# Patient Record
Sex: Female | Born: 1935
Health system: Southern US, Community
[De-identification: ages and names within clinical notes are randomized; demographics above are authoritative.]

## PROBLEM LIST (undated history)

## (undated) DIAGNOSIS — Z66 Do not resuscitate: Secondary | ICD-10-CM

## (undated) DIAGNOSIS — Z9884 Bariatric surgery status: Secondary | ICD-10-CM

## (undated) DIAGNOSIS — C4490 Unspecified malignant neoplasm of skin, unspecified: Secondary | ICD-10-CM

## (undated) DIAGNOSIS — E7800 Pure hypercholesterolemia, unspecified: Secondary | ICD-10-CM

## (undated) DIAGNOSIS — D472 Monoclonal gammopathy: Secondary | ICD-10-CM

## (undated) DIAGNOSIS — B029 Zoster without complications: Secondary | ICD-10-CM

## (undated) DIAGNOSIS — E119 Type 2 diabetes mellitus without complications: Secondary | ICD-10-CM

## (undated) DIAGNOSIS — J8410 Pulmonary fibrosis, unspecified: Secondary | ICD-10-CM

## (undated) DIAGNOSIS — M069 Rheumatoid arthritis, unspecified: Secondary | ICD-10-CM

## (undated) HISTORY — DX: Monoclonal gammopathy: D47.2

## (undated) HISTORY — DX: Bariatric surgery status: Z98.84

## (undated) HISTORY — DX: Unspecified malignant neoplasm of skin, unspecified: C44.90

## (undated) HISTORY — PX: EXPLORATION POST OPERATIVE OPEN HEART: SHX5061

## (undated) HISTORY — DX: Do not resuscitate: Z66

## (undated) HISTORY — PX: MOHS SURGERY: SHX181

## (undated) HISTORY — DX: Zoster without complications: B02.9

## (undated) HISTORY — PX: CHOLECYSTECTOMY: SHX55

## (undated) HISTORY — PX: CARDIAC SURGERY: SHX584

---

## 2005-08-27 HISTORY — PX: GASTRIC BYPASS: SHX52

## 2015-06-11 ENCOUNTER — Ambulatory Visit
Admission: EM | Admit: 2015-06-11 | Discharge: 2015-06-11 | Disposition: A | Discharge status: Home / Self Care | Payer: Commercial Managed Care - HMO | Attending: Family Medicine | Admitting: Family Medicine

## 2015-06-11 ENCOUNTER — Encounter: Payer: Self-pay | Admitting: Gynecology

## 2015-06-11 ENCOUNTER — Ambulatory Visit: Payer: Commercial Managed Care - HMO

## 2015-06-11 DIAGNOSIS — J8410 Pulmonary fibrosis, unspecified: Secondary | ICD-10-CM | POA: Diagnosis not present

## 2015-06-11 DIAGNOSIS — N390 Urinary tract infection, site not specified: Secondary | ICD-10-CM | POA: Diagnosis not present

## 2015-06-11 DIAGNOSIS — R911 Solitary pulmonary nodule: Secondary | ICD-10-CM | POA: Diagnosis not present

## 2015-06-11 HISTORY — DX: Pulmonary fibrosis, unspecified: J84.10

## 2015-06-11 HISTORY — DX: Pure hypercholesterolemia, unspecified: E78.00

## 2015-06-11 HISTORY — DX: Type 2 diabetes mellitus without complications: E11.9

## 2015-06-11 HISTORY — DX: Rheumatoid arthritis, unspecified: M06.9

## 2015-06-11 LAB — URINALYSIS COMPLETE WITH MICROSCOPIC (ARMC ONLY)
BILIRUBIN URINE: NEGATIVE
GLUCOSE, UA: NEGATIVE mg/dL
KETONES UR: NEGATIVE mg/dL
Nitrite: POSITIVE — AB
PH: 6 (ref 5.0–8.0)
Protein, ur: 100 mg/dL — AB
SQUAMOUS EPITHELIAL / LPF: NONE SEEN — AB
Specific Gravity, Urine: 1.025 (ref 1.005–1.030)

## 2015-06-11 MED ORDER — PHENAZOPYRIDINE HCL 200 MG PO TABS: 200.0000 mg | ORAL_TABLET | Freq: Three times a day (TID) | ORAL | Status: DC

## 2015-06-11 MED ORDER — SULFAMETHOXAZOLE-TRIMETHOPRIM 800-160 MG PO TABS: 1.0000 | ORAL_TABLET | Freq: Two times a day (BID) | ORAL | Status: DC

## 2015-06-11 NOTE — Discharge Instructions (Signed)
Pulmonary Fibrosis °Pulmonary fibrosis is a type of lung disease that causes scarring. Over time, the scar tissue (fibrosis) builds up in the air sacs of your lungs. This makes it hard for you to breathe. Less oxygen can get into your blood. °Scarring from pulmonary fibrosis gets worse over time. This damage is permanent. Having damaged lungs may make it more likely that you will have heart problems as well. °CAUSES °Usually, the cause of pulmonary fibrosis is not known (idiopathic pulmonary fibrosis). However, pulmonary fibrosis can be caused by:  °· Exposure to occupational and environmental toxins. These include asbestos, silica, and metal dusts. °· Inhaling moldy hay. This can cause an allergic reaction in the lung (farmer's lung) that can lead to pulmonary fibrosis. °· Inhaling toxic fumes. °· Certain medicines. These include drugs used in radiation therapy or used to treat seizures, heart problems, and some infections. °· Autoimmune diseases, such as rheumatoid arthritis, systemic sclerosis, and connective tissue diseases. °· Sarcoidosis. In this disease, areas of inflammatory cells (granulomas) form and most often affect the lungs. °· Genes. Some cases of pulmonary fibrosis may be passed down through families. °RISK FACTORS °You may be at a higher risk for developing pulmonary fibrosis if: °· You have a family history of the disease. °· You have an autoimmune disease or another condition linked to pulmonary fibrosis. °· You are exposed to certain substances or fumes found in agricultural, farm, construction, or factory work. °· You take certain medicines. °SIGNS AND SYMPTOMS °Symptoms may include: °· Difficulty breathing that gets worse with activity. °· Dry, hacking cough. °· Rapid, shallow breathing during exercise or while at rest. °· Shortness of breath that gets worse (dyspnea). °· Bluish skin and lips. °· Loss of appetite. °· Loss of strength. °· Weight loss and fatigue. °· Rounded and enlarged  fingertips (clubbing). °DIAGNOSIS °Your health care provider may suspect pulmonary fibrosis based on your symptoms and medical history. Diagnosis may include a physical exam. Your health care provider will check for signs that strongly suggest that you have pulmonary fibrosis, such as: °· Blue skin around your fingernails or mouth from reduced oxygen. °· Clubbing around the ends of your fingers. °· A crackling sound when you breathe. °Your health care provider may also do tests to confirm the diagnosis. These may include: °· Looking inside your lungs with an instrument (bronchoscopy). °· Imaging studies of your lungs and heart using: °¨ X-rays. °¨ CT scan. °¨ Sound waves (echocardiogram). °· Tests to measure how well you are breathing (pulmonary function tests). °· Exercise testing to see how well your lungs work while you are walking. °· Blood tests. °· A procedure to remove a small piece of lung tissue to examine in a lab (biopsy). °TREATMENT °There is no cure for pulmonary fibrosis. Treatments focus on managing symptoms and preventing scarring from getting worse. This can include: °· Medicines. °¨ You may take steroids to prevent permanent lung changes. Your health care provider may put you on a high dose at first, then on lower dosages for the long term. °¨ Medicines to suppress your body's defense (immune) system. These can have serious side effects. °· You may be monitored with X-rays and laboratory work. °· Oxygen therapy may be helpful if oxygen in your blood is low. °· Surgery. In some cases, a lung transplant is an option. °HOME CARE INSTRUCTIONS °· Take medicines only as directed by your health care provider. °· Keep your vaccinations up to date as recommended by your health care provider.  °·   Do not use any tobacco products, including cigarettes, chewing tobacco, or electronic cigarettes. If you need help quitting, ask your health care provider. °· Get regular exercise, but do not overexert yourself. Ask  your health care provider to suggest some activities that are safe for you to do. Walking and chair exercises can help if you have physical limitations. °· Consider joining a pulmonary rehabilitation program or a support group for people with pulmonary fibrosis. °· Eat small meals often so you do not get too full. Overeating can make breathing trouble worse. °· Maintain a healthy weight. Lose weight if you need to. °· Do breathing exercises as directed by your health care provider. °· Keep all follow-up visits as directed by your health care provider. This is important. °SEEK MEDICAL CARE IF: °· You are not able to be as active as usual. °· You have a long-lasting (chronic) cough. °· You are often short of breath. °· You have a fever or chills. °SEEK IMMEDIATE MEDICAL CARE IF: °· You have chest pain. °· Your breathing is much worse. °· You cannot take a deep breath. °· You have blue skin around your mouth or fingers. °· You have clubbing of your fingers. °· You cough up mucus that is dark in color. °· You have a lot of headaches. °· You get very confused or sleepy. °  °This information is not intended to replace advice given to you by your health care provider. Make sure you discuss any questions you have with your health care provider. °  °Document Released: 11/03/2003 Document Revised: 09/03/2014 Document Reviewed: 01/21/2014 °Elsevier Interactive Patient Education ©2016 [employer] ° °

## 2015-06-11 NOTE — ED Notes (Signed)
Patient c/o x yesterday burning sensation with urination / frequency  And the urge to void.

## 2015-06-11 NOTE — ED Provider Notes (Signed)
CSN: [id number]     Arrival date & time 06/11/15  1149 History   First MD Initiated Contact with Patient 06/11/15 1252     Chief Complaint  Patient presents with  . Urinary Tract Infection   (Consider location/radiation/quality/duration/timing/severity/associated sxs/prior Treatment) HPI   This 80 year old female presents with one-day history of burning with urination along with frequency in satiety. She has not had any fever or chills. Most of her discomfort with urination is after the end of micturition she has no vaginal bleeding or discharge. She's had UTIs in the past last one in the springtime. She recently moved here from [facility] and has not established with a PCP. She has a history of pulmonary fibrosis states that she has been coughing more recently than before but has been nonproductive.  Past Medical History  Diagnosis Date  . Diabetes mellitus without complication ([place])   . High cholesterol   . RA (rheumatoid arthritis) ([place])   . Pulmonary fibrosis [facility])    Past Surgical History  Procedure Laterality Date  . Gastric bypass  2007  . Cardiac surgery    . Exploration post operative open heart    . Cholecystectomy     No family history on file. Social History  Substance Use Topics  . Smoking status: Never Smoker   . Smokeless tobacco: None  . Alcohol Use: No   OB History    No data available     Review of Systems  Constitutional: Negative for fever, chills and fatigue.  Respiratory: Positive for cough and choking.   All other systems reviewed and are negative.   Allergies  Review of patient's allergies indicates no known allergies.  Home Medications   Prior to Admission medications   Medication Sig Start Date End Date Taking? Authorizing Provider  alendronate (FOSAMAX) 70 MG tablet Take 70 mg by mouth once a week. Take with a full glass of water on an empty stomach.   Yes Historical Provider, MD  glipiZIDE (GLUCOTROL) 5 MG tablet Take by  mouth daily before breakfast.   Yes Historical Provider, MD  Multiple Minerals-Vitamins (CALCIUM & VIT D3 BONE HEALTH PO) Take by mouth.   Yes Historical Provider, MD  Nintedanib (OFEV) 150 MG CAPS Take 150 mg by mouth 2 (two) times daily.   Yes Historical Provider, MD  simvastatin (ZOCOR) 10 MG tablet Take 10 mg by mouth daily.   Yes Historical Provider, MD  phenazopyridine (PYRIDIUM) 200 MG tablet Take 1 tablet (200 mg total) by mouth 3 (three) times daily. 06/11/15   Lorin Picket, PA-C  sulfamethoxazole-trimethoprim (BACTRIM DS,SEPTRA DS) 800-160 MG tablet Take 1 tablet by mouth 2 (two) times daily. 06/11/15   Lorin Picket, PA-C   Meds Ordered and Administered this Visit  Medications - No data to display  BP 120/62 mmHg  Pulse 88  Temp(Src) 97.5 F (36.4 C) (Oral)  Resp 12  Ht 5\' 1"  (1.549 m)  Wt 94 lb 9.6 oz (42.91 kg)  BMI 17.88 kg/m2  SpO2 99% No data found.   Physical Exam  Constitutional: She is oriented to person, place, and time. She appears well-developed and well-nourished.  HENT:  Head: Normocephalic and atraumatic.  Eyes: Pupils are equal, round, and reactive to light.  Neck: Neck supple.  Pulmonary/Chest: Effort normal. No respiratory distress. She has no rales.  Lungs sounds show good air exchange. O2 sat is 99%. He has crackles throughout her entire lung fields primarily bases sound louder. Respiratory rate is 12.  Abdominal:  No CVA tenderness is present  Musculoskeletal: Normal range of motion.  Lymphadenopathy:    She has no cervical adenopathy.  Neurological: She is alert and oriented to person, place, and time.  Skin: Skin is warm and dry.  Psychiatric: She has a normal mood and affect. Her behavior is normal. Judgment and thought content normal.  Nursing note and vitals reviewed.   ED Course  Procedures (including critical care time)  Labs Review Labs Reviewed  URINALYSIS COMPLETEWITH MICROSCOPIC ([place] ONLY) - Abnormal; Notable for the  following:    APPearance TURBID (*)    Hgb urine dipstick 2+ (*)    Protein, ur 100 (*)    Nitrite POSITIVE (*)    Leukocytes, UA 3+ (*)    Bacteria, UA MANY (*)    Squamous Epithelial / LPF NONE SEEN (*)    All other components within normal limits  URINE CULTURE    Imaging Review Dg Chest 2 View  06/11/2015  CLINICAL DATA:  History of pulmonary fibrosis. EXAM: CHEST  2 VIEW COMPARISON:  None. FINDINGS: Normal cardiac silhouette and mediastinal contours. Post median sternotomy. Atherosclerotic plaque within the thoracic aorta. There are extensive nodular reticular opacities throughout the lungs with slight peripheral and basilar predominance. There is minimal pleural parenchymal thickening about the bilateral major fissures. No focal airspace opacities. No pleural effusion or pneumothorax. No evidence of edema. No acute osseus abnormalities. Post cholecystectomy. IMPRESSION: Extensive nodular reticular opacities throughout the bilateral lung, nonspecific though likely compatible with provided history of pulmonary fibrosis. Comparison to prior examinations (if available) would be helpful to ensure stability. Electronically Signed   By: Sandi Mariscal M.D.   On: 06/11/2015 13:47     Visual Acuity Review  Right Eye Distance:   Left Eye Distance:   Bilateral Distance:    Right Eye Near:   Left Eye Near:    Bilateral Near:         MDM   1. UTI (urinary tract infection) with pyuria   2. Pulmonary fibrosis [facility])    Discharge Medication List as of 06/11/2015  2:24 PM    START taking these medications   Details  phenazopyridine (PYRIDIUM) 200 MG tablet Take 1 tablet (200 mg total) by mouth 3 (three) times daily., Starting 06/11/2015, Until Discontinued, Print    sulfamethoxazole-trimethoprim (BACTRIM DS,SEPTRA DS) 800-160 MG tablet Take 1 tablet by mouth 2 (two) times daily., Starting 06/11/2015, Until Discontinued, Print       Discharge Medication List as of 06/11/2015  2:24 PM     START taking these medications   Details  phenazopyridine (PYRIDIUM) 200 MG tablet Take 1 tablet (200 mg total) by mouth 3 (three) times daily., Starting 06/11/2015, Until Discontinued, Print    sulfamethoxazole-trimethoprim (BACTRIM DS,SEPTRA DS) 800-160 MG tablet Take 1 tablet by mouth 2 (two) times daily., Starting 06/11/2015, Until Discontinued, Print       Plan: 1. Test/x-ray results and diagnosis reviewed with patient 2. rx as per orders; risks, benefits, potential side effects reviewed with patient 3. Recommend supportive treatment with fluids. 4. F/u PCP and [place] Shaunak Kreis, PA-C 06/11/15 1457

## 2015-06-15 ENCOUNTER — Ambulatory Visit (INDEPENDENT_AMBULATORY_CARE_PROVIDER_SITE_OTHER): Payer: Commercial Managed Care - HMO | Admitting: Family Medicine

## 2015-06-15 ENCOUNTER — Encounter: Payer: Self-pay | Admitting: Family Medicine

## 2015-06-15 VITALS — BP 102/66 | HR 82 | Temp 97.2°F | Ht 61.0 in | Wt 96.0 lb

## 2015-06-15 DIAGNOSIS — E559 Vitamin D deficiency, unspecified: Secondary | ICD-10-CM

## 2015-06-15 DIAGNOSIS — Z23 Encounter for immunization: Secondary | ICD-10-CM

## 2015-06-15 DIAGNOSIS — Z9884 Bariatric surgery status: Secondary | ICD-10-CM | POA: Insufficient documentation

## 2015-06-15 DIAGNOSIS — M0579 Rheumatoid arthritis with rheumatoid factor of multiple sites without organ or systems involvement: Secondary | ICD-10-CM

## 2015-06-15 DIAGNOSIS — R05 Cough: Secondary | ICD-10-CM | POA: Diagnosis not present

## 2015-06-15 DIAGNOSIS — I2510 Atherosclerotic heart disease of native coronary artery without angina pectoris: Secondary | ICD-10-CM | POA: Diagnosis not present

## 2015-06-15 DIAGNOSIS — E7800 Pure hypercholesterolemia, unspecified: Secondary | ICD-10-CM | POA: Diagnosis not present

## 2015-06-15 DIAGNOSIS — M810 Age-related osteoporosis without current pathological fracture: Secondary | ICD-10-CM

## 2015-06-15 DIAGNOSIS — H269 Unspecified cataract: Secondary | ICD-10-CM | POA: Diagnosis not present

## 2015-06-15 DIAGNOSIS — E119 Type 2 diabetes mellitus without complications: Secondary | ICD-10-CM

## 2015-06-15 DIAGNOSIS — R053 Chronic cough: Secondary | ICD-10-CM

## 2015-06-15 DIAGNOSIS — R5383 Other fatigue: Secondary | ICD-10-CM | POA: Diagnosis not present

## 2015-06-15 DIAGNOSIS — M069 Rheumatoid arthritis, unspecified: Secondary | ICD-10-CM | POA: Insufficient documentation

## 2015-06-15 DIAGNOSIS — J8410 Pulmonary fibrosis, unspecified: Secondary | ICD-10-CM | POA: Diagnosis not present

## 2015-06-15 LAB — URINE CULTURE: Culture: >=100000

## 2015-06-15 MED ORDER — BENZONATATE 100 MG PO CAPS: 100.0000 mg | ORAL_CAPSULE | Freq: Three times a day (TID) | ORAL | Status: DC | PRN

## 2015-06-15 NOTE — Progress Notes (Signed)
BP 102/66 mmHg  Pulse 82  Temp(Src) 97.2 F (36.2 C)  Ht 5\' 1"  (1.549 m)  Wt 96 lb (43.545 kg)  BMI 18.15 kg/m2  SpO2 94%   Subjective:    Patient ID: Anita Atkins, female    DOB: [birth year], 80 y.o.   MRN: [id number]  HPI: Anita Atkins is a 80 y.o. female  Chief Complaint  Patient presents with  . Establish Care    She is interested in possibly getting the Flu and Prevnar   She has recently moved here and needs to establish with a primary doctor and several specialists  She has pulmonary fibrosis and thinks it is causing her cough; gets so bad that she gags like she is going to puke, just dry cough; not getting enough oxygen, knocks her off of her feet; has to lay down and then gets better; getting more winded; she does not have a lung doctor here yet, Dr. Nonnie Done in [place], [place] was her pulmonologist; he actually saved her life; they did a cardiac cath and went straight to CABG, 1 vessel, "widow maker"; not wearing supplemental oxygen; sleeps with HOB raised 6 inches or better; she had the test for aspiration; they did the test then with contrast and he said it was not aspiration; he hasn't quite figured out the cause of the cough; she is not taking anything for reflux; she was seen by ENT and they didn't see anything (he did laryngoscopy)  She has history of gastric bypass, 2007; weighed 220 pounds; she has not stopped losing weight; last complete physical  She had a rheumatologist taking care of her rheumatoid arthritis and osteoporosis; needs to establish with one here  Coronary artery disease, s/p CABG; no chest pain; on statin and occasionally takes 81 mg but not every day, I cautioned her about risk of GI bleeding and risk of MI, so whatever her bariatric surgeon and the cardiologist came up with is what is best we discussed  Type 2 diabetes; the medicine makes her blood sugars bottom out sometimes  She just hasn't felt good in a while; due for labs  She is interested in the  flu shot and pneumonia; she is sure she has not had a pneumonia vaccine since August 2014  Relevant past medical, surgical, family and social history reviewed and updated as indicated Past Medical History  Diagnosis Date  . Diabetes mellitus without complication ([place])   . High cholesterol   . RA (rheumatoid arthritis) ([place])   . Pulmonary fibrosis [facility])    Past Surgical History  Procedure Laterality Date  . Gastric bypass  2007  . Cardiac surgery    . Exploration post operative open heart    . Cholecystectomy     Family History  Problem Relation Age of Onset  . Heart disease Mother   . Stroke Mother   . Diabetes Sister   . Hyperlipidemia Sister   . Cancer Maternal Aunt     female  . COPD Neg Hx   . Hypertension Neg Hx    Allergies and medications reviewed and updated.  Review of Systems  Constitutional: Positive for unexpected weight change (continues to lose weight).  HENT: Negative for hearing loss.   Eyes:       Cataracts both eyes  Respiratory: Positive for cough.   Cardiovascular: Negative for chest pain and leg swelling.  Gastrointestinal: Negative for blood in stool.  Genitourinary: Negative for hematuria.  Musculoskeletal: Positive for back pain and arthralgias.  Neurological: Positive  for numbness.  Hematological: Bruises/bleeds easily.  Psychiatric/Behavioral: Positive for dysphoric mood (husband died in 2023/08/11). The patient is nervous/anxious (stress over estate issues, husband died in 08/11/2023, family issues discussed).   Per HPI unless specifically indicated above     Objective:    BP 102/66 mmHg  Pulse 82  Temp(Src) 97.2 F (36.2 C)  Ht 5\' 1"  (1.549 m)  Wt 96 lb (43.545 kg)  BMI 18.15 kg/m2  SpO2 94%  Wt Readings from Last 3 Encounters:  06/15/15 96 lb (43.545 kg)  06/11/15 94 lb 9.6 oz (42.91 kg)    Physical Exam  Constitutional:  Non-toxic appearance. She does not appear ill. No distress.  Very thin woman, underweight, temporal wasting   HENT:  Mouth/Throat: Mucous membranes are normal.  Eyes: EOM are normal. Right conjunctiva is not injected. Left conjunctiva is not injected. No scleral icterus.  Neck: No JVD present. No thyromegaly present.  Cardiovascular: Normal rate and regular rhythm.   Pulmonary/Chest: Effort normal. She has decreased breath sounds. She has no wheezes.  Abdominal: She exhibits no distension. There is no tenderness.  Neurological: She is alert. She displays no atrophy and no tremor. She exhibits normal muscle tone.  Skin: No bruising, no ecchymosis and no rash noted.  Psychiatric: She has a normal mood and affect. Her behavior is normal. Judgment normal. Cognition and memory are normal.   Diabetic Foot Form - Detailed   Diabetic Foot Exam - detailed  Diabetic Foot exam was performed with the following findings:  Yes 06/15/2015  3:54 PM  Visual Foot Exam completed.:  Yes  Are the toenails long?:  No  Are the toenails thick?:  No  Are the toenails ingrown?:  No    Pulse Foot Exam completed.:  Yes  Right Dorsalis Pedis:  Present Left Dorsalis Pedis:  Present  Sensory Foot Exam Completed.:  Yes  Swelling:  No  Semmes-Weinstein Monofilament Test  R Site 1-Great Toe:  Pos L Site 1-Great Toe:  Pos  R Site 4:  Pos L Site 4:  Pos  R Site 5:  Pos L Site 5:  Pos          Assessment & Plan:   Problem List Items Addressed This Visit      Cardiovascular and Mediastinum   Coronary artery disease involving native coronary artery of native heart without angina pectoris    Discussed the problem with aspirin (cardiologist) versus no aspirin (bariatric surgeron), and she should let the two specialists decide what is best for her; I will defer that call to the two specialist; monitor lipids, keep glucose controlled, keep BP and HR controlled        Respiratory   Pulmonary fibrosis ([place])    Refer to local pulmonologist; I do not have her outside records from her pulmonologist in [place], but  apparently he has done significant work-up in her recent complaints of cough      Relevant Orders   Pneumococcal conjugate vaccine 13-valent IM (Completed)     Endocrine   Type 2 diabetes mellitus without complication, without long-term current use of insulin ([place])    Foot exam by MD today; refer to eye doctor; STOP sulfonylurea, check labs today      Relevant Orders   Hgb A1c w/o eAG (Completed)   Lipid Panel w/o Chol/HDL Ratio (Completed)     Musculoskeletal and Integument   Rheumatoid arthritis ([place])    Per patient report; I do not have outside records; she requests  referral to local rheumatologist for continuation of care here in [place]      Relevant Medications   traMADol (ULTRAM) 50 MG tablet   Other Relevant Orders   Comprehensive metabolic panel (Completed)   CBC with Differential/Platelet (Completed)   Osteoporosis    Refer to rheumatologist for monitoring and treatment; fall precautions, calcium, vit D      Relevant Medications   Cholecalciferol (VITAMIN D3) 5000 UNITS TABS   Other Relevant Orders   Vit D  25 hydroxy (rtn osteoporosis monitoring) (Completed)     Other   Cough, persistent - Primary    Trial of tessalon perles; consider gabapentin if not helpful; request records from ENT and pulmonologist; already had CXR, laryngoscopy      Vitamin D deficiency    Check level and supplement as indicated; important for bone health      Relevant Orders   Vit D  25 hydroxy (rtn osteoporosis monitoring) (Completed)   High blood cholesterol    Check lipid panel; ideal LDL under 70 with hx of CAD; goal HDL over 50; goal TG under 150; limit saturated fats      Fatigue   Relevant Orders   Comprehensive metabolic panel (Completed)   TSH (Completed)   Bariatric surgery status    Check labs      Relevant Orders   Comprehensive metabolic panel (Completed)   Vitamin B12 (Completed)   Vitamin B1 (Completed)   Needs flu shot   Need for prophylactic vaccination with  Streptococcus pneumoniae (Pneumococcus) and Influenza vaccines    Vaccines given today; she is sure she has not received the PCV-13 already; it sounds like she has already had the PPSV-23 vaccine on or after 78 years of age, so she will not need any additional pneumonia vaccines after today per the current ACIP guidelines      Relevant Orders   Pneumococcal conjugate vaccine 13-valent IM (Completed)   Cataracts, bilateral    Refer to ophthalmology for evaluation, and also for routine diabetic eye care       Other Visit Diagnoses    Encounter for immunization           Follow up plan: Return 3-4 weeks, for follow-up.  Orders Placed This Encounter  Procedures  . Flu Vaccine QUAD 36+ mos IM  . Pneumococcal conjugate vaccine 13-valent IM  . Hgb A1c w/o eAG  . Comprehensive metabolic panel  . CBC with Differential/Platelet  . Lipid Panel w/o Chol/HDL Ratio  . TSH  . Vit D  25 hydroxy (rtn osteoporosis monitoring)  . Vitamin B12  . Vitamin B1  . Ambulatory referral to Cardiology  . Ambulatory referral to Rheumatology  . Ambulatory referral to Ophthalmology  . Ambulatory referral to Pulmonology   An after-visit summary was printed and given to the patient at [place].  Please see the patient instructions which may contain other information and recommendations beyond what is mentioned above in the assessment and plan.  Face-to-face time with patient was more than 45 minutes, >50% time spent counseling and coordination of care

## 2015-06-15 NOTE — Assessment & Plan Note (Signed)
Check labs 

## 2015-06-15 NOTE — Patient Instructions (Addendum)
We'll request your records from your previous specialists in [place] We'll get labs today and contact you with the results We'll make several referrals for you to see specialists here locally Stop the diabetes medicine Try the tessalon perles (benzonatate) if needed for cough Please do schedule an appointment to see your eye doctor, and have your eyes examined every year Check your feet every night and let me know right away of any sores, infections, numbness, etc. Try to limit sweets, white bread, white rice, white potatoes It is okay for you to not check your fingerstick blood sugars (per [employer] of Endocrinology Best Practices) Return in 3-4 weeks for further follow-up and records review You received the flu shot today; it should protect you against the flu virus over the coming months; it will take about two weeks for antibodies to develop; do try to stay away from hospitals, nursing homes, and daycares during peak flu season; taking 1000 mg of vitamin C daily during flu season may help you avoid getting sick You also received the pneumonia vaccine called Prevnar (PCV-13) and you will not need a booster for the rest of your life per ACIP guidelines

## 2015-06-15 NOTE — Assessment & Plan Note (Signed)
Trial of tessalon perles; consider gabapentin if not helpful; request records from ENT and pulmonologist; already had CXR, laryngoscopy

## 2015-06-15 NOTE — Assessment & Plan Note (Signed)
Foot exam by MD today; refer to eye doctor; STOP sulfonylurea, check labs today

## 2015-06-15 NOTE — Assessment & Plan Note (Addendum)
Refer to rheumatologist for monitoring and treatment; fall precautions, calcium, vit D

## 2015-06-16 LAB — LIPID PANEL W/O CHOL/HDL RATIO
CHOLESTEROL TOTAL: 173 mg/dL (ref 100–199)
HDL: 59 mg/dL (ref >39)
LDL Calculated: 92 mg/dL (ref 0–99)
Triglycerides: 108 mg/dL (ref 0–149)
VLDL CHOLESTEROL CAL: 22 mg/dL (ref 5–40)

## 2015-06-16 LAB — CBC WITH DIFFERENTIAL/PLATELET
BASOS: 1 %
Basophils Absolute: 0 10*3/uL (ref 0.0–0.2)
EOS (ABSOLUTE): 0.3 10*3/uL (ref 0.0–0.4)
EOS: 4 %
HEMATOCRIT: 37.6 % (ref 34.0–46.6)
Hemoglobin: 12.7 g/dL (ref 11.1–15.9)
IMMATURE GRANULOCYTES: 0 %
Immature Grans (Abs): 0 10*3/uL (ref 0.0–0.1)
LYMPHS ABS: 1.9 10*3/uL (ref 0.7–3.1)
Lymphs: 29 %
MCH: 33.2 pg — AB (ref 26.6–33.0)
MCHC: 33.8 g/dL (ref 31.5–35.7)
MCV: 98 fL — AB (ref 79–97)
MONOS ABS: 0.5 10*3/uL (ref 0.1–0.9)
Monocytes: 8 %
NEUTROS PCT: 58 %
Neutrophils Absolute: 3.7 10*3/uL (ref 1.4–7.0)
PLATELETS: 327 10*3/uL (ref 150–379)
RBC: 3.82 x10E6/uL (ref 3.77–5.28)
RDW: 14.7 % (ref 12.3–15.4)
WBC: 6.3 10*3/uL (ref 3.4–10.8)

## 2015-06-16 LAB — VITAMIN B12

## 2015-06-16 LAB — COMPREHENSIVE METABOLIC PANEL
A/G RATIO: 1.3 (ref 1.1–2.5)
ALBUMIN: 4 g/dL (ref 3.5–4.8)
ALK PHOS: 86 IU/L (ref 39–117)
ALT: 17 IU/L (ref 0–32)
AST: 25 IU/L (ref 0–40)
BUN / CREAT RATIO: 15 (ref 11–26)
BUN: 13 mg/dL (ref 8–27)
Bilirubin Total: 0.4 mg/dL (ref 0.0–1.2)
CALCIUM: 9.3 mg/dL (ref 8.7–10.3)
CO2: 25 mmol/L (ref 18–29)
CREATININE: 0.89 mg/dL (ref 0.57–1.00)
Chloride: 99 mmol/L (ref 97–106)
GFR calc Af Amer: 72 mL/min/{1.73_m2} (ref >59)
GFR, EST NON AFRICAN AMERICAN: 62 mL/min/{1.73_m2} (ref >59)
GLOBULIN, TOTAL: 3.1 g/dL (ref 1.5–4.5)
Glucose: 111 mg/dL — ABNORMAL HIGH (ref 65–99)
POTASSIUM: 5.4 mmol/L — AB (ref 3.5–5.2)
SODIUM: 136 mmol/L (ref 136–144)
Total Protein: 7.1 g/dL (ref 6.0–8.5)

## 2015-06-16 LAB — HGB A1C W/O EAG: Hgb A1c MFr Bld: 6.8 % — ABNORMAL HIGH (ref 4.8–5.6)

## 2015-06-16 LAB — VITAMIN D 25 HYDROXY (VIT D DEFICIENCY, FRACTURES): Vit D, 25-Hydroxy: 21.5 ng/mL — ABNORMAL LOW (ref 30.0–100.0)

## 2015-06-16 LAB — TSH: TSH: 0.996 u[IU]/mL (ref 0.450–4.500)

## 2015-06-17 ENCOUNTER — Telehealth: Payer: Self-pay | Admitting: Family Medicine

## 2015-06-17 DIAGNOSIS — E875 Hyperkalemia: Secondary | ICD-10-CM

## 2015-06-17 LAB — VITAMIN B1: THIAMINE: 137.9 nmol/L (ref 66.5–200.0)

## 2015-06-17 NOTE — Telephone Encounter (Signed)
2,000 iu daily for 2 months then 1,000 iu daily Discussed other labs She'll get back on her vitamin with folic acid Avoid high-potassium Recheck K+ in one week She thanked me for the call

## 2015-06-20 DIAGNOSIS — H269 Unspecified cataract: Secondary | ICD-10-CM | POA: Insufficient documentation

## 2015-06-20 NOTE — Assessment & Plan Note (Signed)
Check level and supplement as indicated; important for bone health

## 2015-06-20 NOTE — Assessment & Plan Note (Signed)
Check lipid panel; ideal LDL under 70 with hx of CAD; goal HDL over 50; goal TG under 150; limit saturated fats

## 2015-06-20 NOTE — Assessment & Plan Note (Signed)
Refer to ophthalmology for evaluation, and also for routine diabetic eye care

## 2015-06-20 NOTE — Assessment & Plan Note (Signed)
Discussed the problem with aspirin (cardiologist) versus no aspirin (bariatric surgeron), and she should let the two specialists decide what is best for her; I will defer that call to the two specialist; monitor lipids, keep glucose controlled, keep BP and HR controlled

## 2015-06-20 NOTE — Assessment & Plan Note (Addendum)
Refer to local pulmonologist; I do not have her outside records from her pulmonologist in [place], but apparently he has done significant work-up in her recent complaints of cough

## 2015-06-20 NOTE — Assessment & Plan Note (Signed)
Vaccines given today; she is sure she has not received the PCV-13 already; it sounds like she has already had the PPSV-23 vaccine on or after 80 years of age, so she will not need any additional pneumonia vaccines after today per the current ACIP guidelines

## 2015-06-20 NOTE — Assessment & Plan Note (Signed)
Per patient report; I do not have outside records; she requests referral to local rheumatologist for continuation of care here in [facility]

## 2015-06-22 ENCOUNTER — Encounter: Payer: Self-pay | Admitting: *Deleted

## 2015-06-27 ENCOUNTER — Other Ambulatory Visit: Payer: Self-pay | Admitting: Family Medicine

## 2015-06-27 MED ORDER — BENZONATATE 100 MG PO CAPS: 100.0000 mg | ORAL_CAPSULE | Freq: Three times a day (TID) | ORAL | Status: DC | PRN

## 2015-06-27 NOTE — Telephone Encounter (Signed)
Yes, that's fine; I sent new Rx I'm glad we found something that works

## 2015-06-27 NOTE — Telephone Encounter (Signed)
Pt stated the the amount of medication given will not last through the month the way it is written and she only has enough left to last for 5 more days. She would like to sent to [facility]

## 2015-06-27 NOTE — Telephone Encounter (Signed)
She states its the Benzonate pearles, shes been taking them twice a day. She knows she has a refill but wants to know if you can write it for more than a qty of 30. She states it is helping.

## 2015-06-28 NOTE — Telephone Encounter (Signed)
Patient notified

## 2015-07-05 ENCOUNTER — Ambulatory Visit (INDEPENDENT_AMBULATORY_CARE_PROVIDER_SITE_OTHER): Payer: Commercial Managed Care - HMO | Admitting: Internal Medicine

## 2015-07-05 ENCOUNTER — Encounter (INDEPENDENT_AMBULATORY_CARE_PROVIDER_SITE_OTHER): Payer: Self-pay

## 2015-07-05 ENCOUNTER — Encounter: Payer: Self-pay | Admitting: Internal Medicine

## 2015-07-05 VITALS — BP 122/68 | HR 72 | Ht 62.0 in | Wt 98.6 lb

## 2015-07-05 DIAGNOSIS — J8410 Pulmonary fibrosis, unspecified: Secondary | ICD-10-CM | POA: Diagnosis not present

## 2015-07-05 DIAGNOSIS — K219 Gastro-esophageal reflux disease without esophagitis: Secondary | ICD-10-CM

## 2015-07-05 MED ORDER — PANTOPRAZOLE SODIUM 20 MG PO TBEC: 20.0000 mg | DELAYED_RELEASE_TABLET | Freq: Every day | ORAL | Status: DC

## 2015-07-05 NOTE — Patient Instructions (Signed)
Pulmonary Fibrosis °Pulmonary fibrosis is a type of lung disease that causes scarring. Over time, the scar tissue (fibrosis) builds up in the air sacs of your lungs. This makes it hard for you to breathe. Less oxygen can get into your blood. °Scarring from pulmonary fibrosis gets worse over time. This damage is permanent. Having damaged lungs may make it more likely that you will have heart problems as well. °CAUSES °Usually, the cause of pulmonary fibrosis is not known (idiopathic pulmonary fibrosis). However, pulmonary fibrosis can be caused by:  °· Exposure to occupational and environmental toxins. These include asbestos, silica, and metal dusts. °· Inhaling moldy hay. This can cause an allergic reaction in the lung (farmer's lung) that can lead to pulmonary fibrosis. °· Inhaling toxic fumes. °· Certain medicines. These include drugs used in radiation therapy or used to treat seizures, heart problems, and some infections. °· Autoimmune diseases, such as rheumatoid arthritis, systemic sclerosis, and connective tissue diseases. °· Sarcoidosis. In this disease, areas of inflammatory cells (granulomas) form and most often affect the lungs. °· Genes. Some cases of pulmonary fibrosis may be passed down through families. °RISK FACTORS °You may be at a higher risk for developing pulmonary fibrosis if: °· You have a family history of the disease. °· You have an autoimmune disease or another condition linked to pulmonary fibrosis. °· You are exposed to certain substances or fumes found in agricultural, farm, construction, or factory work. °· You take certain medicines. °SIGNS AND SYMPTOMS °Symptoms may include: °· Difficulty breathing that gets worse with activity. °· Dry, hacking cough. °· Rapid, shallow breathing during exercise or while at rest. °· Shortness of breath that gets worse (dyspnea). °· Bluish skin and lips. °· Loss of appetite. °· Loss of strength. °· Weight loss and fatigue. °· Rounded and enlarged  fingertips (clubbing). °DIAGNOSIS °Your health care provider may suspect pulmonary fibrosis based on your symptoms and medical history. Diagnosis may include a physical exam. Your health care provider will check for signs that strongly suggest that you have pulmonary fibrosis, such as: °· Blue skin around your fingernails or mouth from reduced oxygen. °· Clubbing around the ends of your fingers. °· A crackling sound when you breathe. °Your health care provider may also do tests to confirm the diagnosis. These may include: °· Looking inside your lungs with an instrument (bronchoscopy). °· Imaging studies of your lungs and heart using: °¨ X-rays. °¨ CT scan. °¨ Sound waves (echocardiogram). °· Tests to measure how well you are breathing (pulmonary function tests). °· Exercise testing to see how well your lungs work while you are walking. °· Blood tests. °· A procedure to remove a small piece of lung tissue to examine in a lab (biopsy). °TREATMENT °There is no cure for pulmonary fibrosis. Treatments focus on managing symptoms and preventing scarring from getting worse. This can include: °· Medicines. °¨ You may take steroids to prevent permanent lung changes. Your health care provider may put you on a high dose at first, then on lower dosages for the long term. °¨ Medicines to suppress your body's defense (immune) system. These can have serious side effects. °· You may be monitored with X-rays and laboratory work. °· Oxygen therapy may be helpful if oxygen in your blood is low. °· Surgery. In some cases, a lung transplant is an option. °HOME CARE INSTRUCTIONS °· Take medicines only as directed by your health care provider. °· Keep your vaccinations up to date as recommended by your health care provider.  °·   Do not use any tobacco products, including cigarettes, chewing tobacco, or electronic cigarettes. If you need help quitting, ask your health care provider. °· Get regular exercise, but do not overexert yourself. Ask  your health care provider to suggest some activities that are safe for you to do. Walking and chair exercises can help if you have physical limitations. °· Consider joining a pulmonary rehabilitation program or a support group for people with pulmonary fibrosis. °· Eat small meals often so you do not get too full. Overeating can make breathing trouble worse. °· Maintain a healthy weight. Lose weight if you need to. °· Do breathing exercises as directed by your health care provider. °· Keep all follow-up visits as directed by your health care provider. This is important. °SEEK MEDICAL CARE IF: °· You are not able to be as active as usual. °· You have a long-lasting (chronic) cough. °· You are often short of breath. °· You have a fever or chills. °SEEK IMMEDIATE MEDICAL CARE IF: °· You have chest pain. °· Your breathing is much worse. °· You cannot take a deep breath. °· You have blue skin around your mouth or fingers. °· You have clubbing of your fingers. °· You cough up mucus that is dark in color. °· You have a lot of headaches. °· You get very confused or sleepy. °  °This information is not intended to replace advice given to you by your health care provider. Make sure you discuss any questions you have with your health care provider. °  °Document Released: 11/03/2003 Document Revised: 09/03/2014 Document Reviewed: 01/21/2014 °Elsevier Interactive Patient Education ©2016 [employer] ° °

## 2015-07-05 NOTE — Progress Notes (Signed)
[facility] Consultation      Date: 07/05/2015,   MRN# [id number] Anita Atkins August 31, 1934 Code Status:  Hosp day:@LENGTHOFSTAYDAYS @ Referring MD: @ATDPROV @     PCP:      AdmissionWeight: 98 lb 9.6 oz (44.725 kg)                 CurrentWeight: 98 lb 9.6 oz (44.725 kg) Anita Atkins is a 80 y.o. old female seen in consultation for pulmonary fibrosis     CHIEF COMPLAINT:  SOB and WOB H/o pulmonary fibrosis    HISTORY OF PRESENT ILLNESS  80 yo white female seen today for previous dx of pulm fibrosis apporx 10-15 years ago Had seen Pulmonologist in [facility]  Her symptoms are SOB and DOE chronic, associated with weakness and palpitations with exertion, been going on for several months Patient risk factors includes heavy second hand smoke exposure, every now and then, she has intermittent chest tightness Patient had been on OFEV fpr PULM fibrosis  for last 2 years but has had severe diarrhea associated with dehydration and weight loss Patient stopped OFEV 1 month ago. Patient also has associated chronic cough that is relieved with tesselon perles Patient has no fevers, chills at this time, no evidence of infection Patient sitting up and breathing comfortably     PAST MEDICAL HISTORY   Past Medical History  Diagnosis Date  . Diabetes mellitus without complication ([place])   . High cholesterol   . RA (rheumatoid arthritis) ([place])   . Pulmonary fibrosis ([place])      SURGICAL HISTORY   Past Surgical History  Procedure Laterality Date  . Gastric bypass  2007  . Cardiac surgery    . Exploration post operative open heart    . Cholecystectomy       FAMILY HISTORY   Family History  Problem Relation Age of Onset  . Heart disease Mother   . Stroke Mother   . Diabetes Sister   . Hyperlipidemia Sister   . Cancer Maternal Aunt     female  . COPD Neg Hx   . Hypertension Neg Hx      SOCIAL HISTORY   Social History  Substance Use Topics  . Smoking status:  Never Smoker   . Smokeless tobacco: Never Used  . Alcohol Use: No     MEDICATIONS    Home Medication:  Current Outpatient Rx  Name  Route  Sig  Dispense  Refill  . alendronate (FOSAMAX) 70 MG tablet   Oral   Take 70 mg by mouth once a week. Take with a full glass of water on an empty stomach.         Marland Kitchen azelastine (ASTELIN) 0.1 % nasal spray               . benzonatate (TESSALON) 100 MG capsule   Oral   Take 1 capsule (100 mg total) by mouth every 8 (eight) hours as needed for cough.   60 capsule   0   . Cholecalciferol (VITAMIN D3) 5000 UNITS TABS   Oral   Take 5,000 Units by mouth daily.         . Multiple Minerals-Vitamins (CALCIUM & VIT D3 BONE HEALTH PO)   Oral   Take by mouth daily.          . Nintedanib (OFEV) 150 MG CAPS   Oral   Take 150 mg by mouth 2 (two) times daily.         . simvastatin (  ZOCOR) 10 MG tablet   Oral   Take 20 mg by mouth at bedtime.          . sulfamethoxazole-trimethoprim (BACTRIM DS,SEPTRA DS) 800-160 MG tablet   Oral   Take 1 tablet by mouth 2 (two) times daily.   14 tablet   0   . traMADol (ULTRAM) 50 MG tablet   Oral   Take 50 mg by mouth as needed.            Current Medication:  Current outpatient prescriptions:  .  alendronate (FOSAMAX) 70 MG tablet, Take 70 mg by mouth once a week. Take with a full glass of water on an empty stomach., Disp: , Rfl:  .  azelastine (ASTELIN) 0.1 % nasal spray, , Disp: , Rfl:  .  benzonatate (TESSALON) 100 MG capsule, Take 1 capsule (100 mg total) by mouth every 8 (eight) hours as needed for cough., Disp: 60 capsule, Rfl: 0 .  Cholecalciferol (VITAMIN D3) 5000 UNITS TABS, Take 5,000 Units by mouth daily., Disp: , Rfl:  .  Multiple Minerals-Vitamins (CALCIUM & VIT D3 BONE HEALTH PO), Take by mouth daily. , Disp: , Rfl:  .  Nintedanib (OFEV) 150 MG CAPS, Take 150 mg by mouth 2 (two) times daily., Disp: , Rfl:  .  simvastatin (ZOCOR) 10 MG tablet, Take 20 mg by mouth at bedtime.  , Disp: , Rfl:  .  sulfamethoxazole-trimethoprim (BACTRIM DS,SEPTRA DS) 800-160 MG tablet, Take 1 tablet by mouth 2 (two) times daily., Disp: 14 tablet, Rfl: 0 .  traMADol (ULTRAM) 50 MG tablet, Take 50 mg by mouth as needed. , Disp: , Rfl:     ALLERGIES   Review of patient's allergies indicates no known allergies.     REVIEW OF SYSTEMS   Review of Systems  Constitutional: Positive for weight loss and malaise/fatigue. Negative for fever and chills.  HENT: Positive for congestion.   Eyes: Negative for blurred vision and double vision.  Respiratory: Positive for cough and shortness of breath.   Cardiovascular: Positive for chest pain and palpitations. Negative for leg swelling.  Gastrointestinal: Positive for heartburn, nausea and abdominal pain. Negative for vomiting.  Genitourinary: Negative.   Musculoskeletal: Positive for myalgias.  Skin: Negative for rash.  Neurological: Positive for weakness. Negative for dizziness, tingling, tremors and headaches.  Endo/Heme/Allergies: Does not bruise/bleed easily.  Psychiatric/Behavioral: Negative for depression. The patient is nervous/anxious.      VS: BP 122/68 mmHg  Pulse 72  Ht 5\' 2"  (1.575 m)  Wt 98 lb 9.6 oz (44.725 kg)  BMI 18.03 kg/m2  SpO2 97%     PHYSICAL EXAM  Physical Exam  Constitutional: She is oriented to person, place, and time. She appears well-developed and well-nourished. No distress.  Thin and cachectic  HENT:  Head: Normocephalic and atraumatic.  Mouth/Throat: No oropharyngeal exudate.  Eyes: EOM are normal. Pupils are equal, round, and reactive to light. No scleral icterus.  Neck: Normal range of motion. Neck supple.  Cardiovascular: Normal rate, regular rhythm and normal heart sounds.   No murmur heard. Pulmonary/Chest: No stridor. No respiratory distress. She has no wheezes. She has rales.  Abdominal: Soft. Bowel sounds are normal.  Musculoskeletal: Normal range of motion. She exhibits no edema.    Neurological: She is alert and oriented to person, place, and time.  Skin: Skin is warm. She is not diaphoretic.  Psychiatric: She has a normal mood and affect.         IMAGING  Dg Chest 2 View  06/11/2015  CLINICAL DATA:  History of pulmonary fibrosis. EXAM: CHEST  2 VIEW COMPARISON:  None. FINDINGS: Normal cardiac silhouette and mediastinal contours. Post median sternotomy. Atherosclerotic plaque within the thoracic aorta. There are extensive nodular reticular opacities throughout the lungs with slight peripheral and basilar predominance. There is minimal pleural parenchymal thickening about the bilateral major fissures. No focal airspace opacities. No pleural effusion or pneumothorax. No evidence of edema. No acute osseus abnormalities. Post cholecystectomy. IMPRESSION: Extensive nodular reticular opacities throughout the bilateral lung, nonspecific though likely compatible with provided history of pulmonary fibrosis. Comparison to prior examinations (if available) would be helpful to ensure stability. Electronically Signed   By: Sandi Mariscal M.D.   On: 06/11/2015 13:47   CXR on 06/11/2015 Images reviewed on 07/05/2015       ASSESSMENT/PLAN   80 yo white female with Pulmonary Fibrosis(no biopsy) for many years with chronic SOB and DOE with chronic cough and intermittent chest tightness  1.will need to obtain 6 MWT test and PFT to assess oxygen needs 2.needs overnight Pulse Ox 3.start PPI 4.continue cough suppressant 5.follow up with cardiology needed for follow up CABG 6.follow up GI consults for h/o gastric bypass   I have personally obtained a history, examined the patient, evaluated laboratory and independently reviewed imaging results, formulated the assessment and plan and placed orders.  The Patient requires high complexity decision making for assessment and support, frequent evaluation and titration of therapies, application of advanced monitoring technologies and  extensive interpretation of multiple databases.  Patient satisfied with Plan of action and management. All questions answered  Corrin Parker, M.D.  [facility]  Medical Director [place] Director [facility] Cardio-Pulmonary Department

## 2015-07-08 ENCOUNTER — Ambulatory Visit (INDEPENDENT_AMBULATORY_CARE_PROVIDER_SITE_OTHER): Payer: Commercial Managed Care - HMO | Admitting: Internal Medicine

## 2015-07-08 DIAGNOSIS — J8410 Pulmonary fibrosis, unspecified: Secondary | ICD-10-CM

## 2015-07-08 DIAGNOSIS — R053 Chronic cough: Secondary | ICD-10-CM

## 2015-07-08 DIAGNOSIS — R05 Cough: Secondary | ICD-10-CM

## 2015-07-08 LAB — PULMONARY FUNCTION TEST
DL/VA % PRED: 81 %
DL/VA: 3.64 ml/min/mmHg/L
DLCO UNC % PRED: 48 %
DLCO UNC: 10.08 ml/min/mmHg
FEF 25-75 POST: 0.44 L/s
FEF 25-75 Pre: 1.73 L/sec
FEF2575-%Change-Post: -74 %
FEF2575-%Pred-Post: 33 %
FEF2575-%Pred-Pre: 129 %
FEV1-%CHANGE-POST: -28 %
FEV1-%PRED-PRE: 63 %
FEV1-%Pred-Post: 45 %
FEV1-Post: 0.8 L
FEV1-Pre: 1.12 L
FEV1FVC-%CHANGE-POST: -18 %
FEV1FVC-%Pred-Pre: 124 %
FEV6-%Change-Post: -12 %
FEV6-%PRED-PRE: 54 %
FEV6-%Pred-Post: 47 %
FEV6-POST: 1.05 L
FEV6-PRE: 1.21 L
FEV6FVC-%PRED-POST: 106 %
FEV6FVC-%PRED-PRE: 106 %
FVC-%Change-Post: -12 %
FVC-%PRED-POST: 44 %
FVC-%PRED-PRE: 51 %
FVC-POST: 1.05 L
FVC-PRE: 1.21 L
POST FEV6/FVC RATIO: 100 %
PRE FEV6/FVC RATIO: 100 %
Post FEV1/FVC ratio: 75 %
Pre FEV1/FVC ratio: 92 %

## 2015-07-08 NOTE — Progress Notes (Signed)
PFT performed today with Nitrogen washout. Pt unable to do both DLCO due to not being able to blow out long enough. KK aware.

## 2015-07-08 NOTE — Progress Notes (Signed)
SMW performed today. 

## 2015-07-13 ENCOUNTER — Other Ambulatory Visit: Payer: Self-pay | Admitting: Family Medicine

## 2015-07-13 ENCOUNTER — Encounter: Payer: Self-pay | Admitting: Family Medicine

## 2015-07-13 ENCOUNTER — Ambulatory Visit (INDEPENDENT_AMBULATORY_CARE_PROVIDER_SITE_OTHER): Payer: Commercial Managed Care - HMO | Admitting: Family Medicine

## 2015-07-13 VITALS — BP 142/83 | HR 87 | Temp 97.2°F | Wt 98.0 lb

## 2015-07-13 DIAGNOSIS — E119 Type 2 diabetes mellitus without complications: Secondary | ICD-10-CM

## 2015-07-13 DIAGNOSIS — E1142 Type 2 diabetes mellitus with diabetic polyneuropathy: Secondary | ICD-10-CM

## 2015-07-13 DIAGNOSIS — D7589 Other specified diseases of blood and blood-forming organs: Secondary | ICD-10-CM

## 2015-07-13 DIAGNOSIS — K529 Noninfective gastroenteritis and colitis, unspecified: Secondary | ICD-10-CM

## 2015-07-13 DIAGNOSIS — I2510 Atherosclerotic heart disease of native coronary artery without angina pectoris: Secondary | ICD-10-CM | POA: Diagnosis not present

## 2015-07-13 DIAGNOSIS — Z9884 Bariatric surgery status: Secondary | ICD-10-CM

## 2015-07-13 DIAGNOSIS — E875 Hyperkalemia: Secondary | ICD-10-CM | POA: Diagnosis not present

## 2015-07-13 DIAGNOSIS — E7800 Pure hypercholesterolemia, unspecified: Secondary | ICD-10-CM

## 2015-07-13 DIAGNOSIS — J8410 Pulmonary fibrosis, unspecified: Secondary | ICD-10-CM | POA: Diagnosis not present

## 2015-07-13 DIAGNOSIS — E1140 Type 2 diabetes mellitus with diabetic neuropathy, unspecified: Secondary | ICD-10-CM | POA: Insufficient documentation

## 2015-07-13 DIAGNOSIS — M069 Rheumatoid arthritis, unspecified: Secondary | ICD-10-CM | POA: Diagnosis not present

## 2015-07-13 DIAGNOSIS — E559 Vitamin D deficiency, unspecified: Secondary | ICD-10-CM

## 2015-07-13 DIAGNOSIS — R0602 Shortness of breath: Secondary | ICD-10-CM | POA: Diagnosis not present

## 2015-07-13 DIAGNOSIS — F320 Major depressive disorder, single episode, mild: Secondary | ICD-10-CM

## 2015-07-13 MED ORDER — BENZONATATE 100 MG PO CAPS: 100.0000 mg | ORAL_CAPSULE | Freq: Three times a day (TID) | ORAL | Status: DC | PRN

## 2015-07-13 MED ORDER — MIRTAZAPINE 15 MG PO TABS: 15.0000 mg | ORAL_TABLET | Freq: Every day | ORAL | Status: DC

## 2015-07-13 NOTE — Assessment & Plan Note (Signed)
Taking statin; no symptoms; try to limit saturated fats

## 2015-07-13 NOTE — Assessment & Plan Note (Signed)
Encouraged her to discuss her concerns about DMARDS and potential side effects with her rheumatologist

## 2015-07-13 NOTE — Assessment & Plan Note (Signed)
She cannot take aspirin because of bariatric surgery status

## 2015-07-13 NOTE — Assessment & Plan Note (Addendum)
Check urine microalbumin today; foot exam by MD today; check sugars periodically, not necessary to check daily; A1C due again on or after December 15, 2015; she is doing well on lower dose of medicine

## 2015-07-13 NOTE — Assessment & Plan Note (Signed)
Seeing pulmonologist 

## 2015-07-13 NOTE — Progress Notes (Signed)
BP 142/83 mmHg  Pulse 87  Temp(Src) 97.2 F (36.2 C)  Wt 98 lb (44.453 kg)  SpO2 98%   Subjective:    Patient ID: Anita Atkins, female    DOB: [birth year], 80 y.o.   MRN: [id number]  HPI: Anita Atkins is a 80 y.o. female  Chief Complaint  Patient presents with  . Follow-up    follow up appt from new patient appt. She is doing well, no complaints.   Since her last visit, she has been to see the lung doctor, very nice gentleman; Dr. Mortimer Fries; did the test for oxygen levels last night  She is going to see rheumatologist soon, Dr. Jefm Bryant; she quit taking the RA medicine because she is afraid of side effects  [facility], but she can't recall who she saw there  Diabetes; last A1C was 6.8 (reviewed today, drawn June 15, 2015); her glipizide dose was reduced and that is going fine; she says her sugars a few times a week; no lows; highest sugar recently was maybe 154; she has neuropathy in both feet; she would like to get hooked up for diabetic shoes; no hx of preulcerative callus; she has tingling and moving up the leg; eye doctor appt is in December She has high potassium and is not on ACE-I or ARB right now; she does not recall ever being on one of those; she is not aware of any kidney damage hx; we will recheck K+ today; she has modified her diet to limit K+ rich foods and drinks She cannot take aspirin because of her history of gastric bypass   High cholesterol; taking 20 mg simvastatin; no side effects, no muscle aches, no abd pain; limits red meat; lots of beans; we reveiwed lipids and at all target goals  Vitamin D deficiency discovered at last labs; she has been taking 2000 iu daily; last labs was 21.5  TSH was normal at 0.996; getting plenty of B12  She feels empty; does not cry, feels sad but doesn't cry; does not feel real happiness; feels useless; the other day, just realized she felt empty; no real happiness; she has the kids here and grandkids here,  but still... Not sure if something she should be doing; sleeping okay, sometimes sleeps during the day and falls off pretty quickly; does get out in the sun; at least 20 minutes a day; never been on medicine to help her mood; no known family hx of depression or anxiety; her sister died in 2003/12/10; she feels aggravated, not short-tempered, maybe let down; widowed, husband did not leave her the house; she can get counseling through her church  Chronic diarrhea for over a year; she saw a gastroenterologist in 12/09/2012 and she had a gallbladder problem, then had it out; still has diarrhea; no abdominal pain; if she has too much to eat, she has to lay down, because her stomach is so small because of hte bypass; half of a cup of oral intake is okay, one full cup is max  She is status post hysterectomy, heavy bleeding, had a large cyst on one ovary, broke loose, was "rotting" behind her uterus; they never really found the ovary, but they took the other out; not quite sure about the other ovary; she has had lung xrays not long ago here  Relevant past medical, surgical, family and social history reviewed and updated as indicated. Interim medical history since our last visit reviewed. Allergies and medications reviewed and updated. She decided  to lay off of the [place] and her diarrhea has gotten a lot better  Review of Systems Per HPI unless specifically indicated above; her urine smells spicy and so do her bowels, she just mentions; she talked to pharmacist and he gave her a probiotic and that helped     Objective:    BP 142/83 mmHg  Pulse 87  Temp(Src) 97.2 F (36.2 C)  Wt 98 lb (44.453 kg)  SpO2 98%  Wt Readings from Last 3 Encounters:  07/13/15 98 lb (44.453 kg)  07/05/15 98 lb 9.6 oz (44.725 kg)  06/15/15 96 lb (43.545 kg)  body mass index is 17.92 kg/(m^2).   Physical Exam  Constitutional:  Non-toxic appearance. She does not appear ill. No distress.  very thin woman, underweight, temporal wasting;  weight gain of two pounds  HENT:  Mouth/Throat: Mucous membranes are normal.  Eyes: EOM are normal. Right conjunctiva is not injected. Left conjunctiva is not injected. No scleral icterus.  Cardiovascular: Normal rate and regular rhythm.   Pulmonary/Chest: Effort normal. She has decreased breath sounds. She has no wheezes.  Abdominal: Bowel sounds are normal. She exhibits no distension. There is no tenderness.  Musculoskeletal: She exhibits no edema.  Neurological: She is alert. She displays no atrophy and no tremor. She exhibits normal muscle tone.  Skin: No bruising, no ecchymosis and no rash noted.  Psychiatric: She has a normal mood and affect. Her behavior is normal. Judgment normal. Cognition and memory are normal.  Good eye contact with examiner; not tearful   Diabetic Foot Form - Detailed   Diabetic Foot Exam - detailed  Diabetic Foot exam was performed with the following findings:  Yes 07/13/2015 11:30 AM  Visual Foot Exam completed.:  Yes  Are the toenails long?:  No  Are the toenails thick?:  No  Are the toenails ingrown?:  No  Normal Range of Motion:  Yes    Pulse Foot Exam completed.:  Yes  Right Dorsalis Pedis:  Present Left Dorsalis Pedis:  Present  Sensory Foot Exam Completed.:  Yes  Swelling:  No  Semmes-Weinstein Monofilament Test  R Site 1-Great Toe:  Pos L Site 1-Great Toe:  Pos  R Site 4:  Pos L Site 4:  Pos  R Site 5:  Pos L Site 5:  Pos         Results for orders placed or performed in visit on 07/08/15  Pulmonary function test  Result Value Ref Range   FVC-Pre 1.21 L   FVC-%Pred-Pre 51 %   FVC-Post 1.05 L   FVC-%Pred-Post 44 %   FVC-%Change-Post -12 %   FEV1-Pre 1.12 L   FEV1-%Pred-Pre 63 %   FEV1-Post 0.80 L   FEV1-%Pred-Post 45 %   FEV1-%Change-Post -28 %   FEV6-Pre 1.21 L   FEV6-%Pred-Pre 54 %   FEV6-Post 1.05 L   FEV6-%Pred-Post 47 %   FEV6-%Change-Post -12 %   Pre FEV1/FVC ratio 92 %   FEV1FVC-%Pred-Pre 124 %   Post FEV1/FVC ratio 75  %   FEV1FVC-%Change-Post -18 %   Pre FEV6/FVC Ratio 100 %   FEV6FVC-%Pred-Pre 106 %   Post FEV6/FVC ratio 100 %   FEV6FVC-%Pred-Post 106 %   FEF 25-75 Pre 1.73 L/sec   FEF2575-%Pred-Pre 129 %   FEF 25-75 Post 0.44 L/sec   FEF2575-%Pred-Post 33 %   FEF2575-%Change-Post -74 %   DLCO unc 10.08 ml/min/mmHg   DLCO unc % pred 48 %   DL/VA 3.64 ml/min/mmHg/L   DL/VA %  pred 81 %      Assessment & Plan:   Problem List Items Addressed This Visit      Respiratory   Pulmonary fibrosis [facility])    Seeing pulmonologist        Digestive   Chronic diarrhea    Check celiac panel and refer to GI      Relevant Orders   Celiac Disease Ab Screen w/Rfx   Ambulatory referral to Gastroenterology     Endocrine   Type 2 diabetes mellitus without complication, without long-term current use of insulin (HCC)    Check urine microalbumin today; foot exam by MD today; check sugars periodically, not necessary to check daily; A1C due again on or after December 15, 2015; she is doing well on lower dose of medicine      Relevant Medications   simvastatin (ZOCOR) 20 MG tablet   Other Relevant Orders   Microalbumin / creatinine urine ratio   Diabetic neuropathy ([place]) - Primary    Refer to podiatrist for evaluation, consideration of diabetic shoes; patient to check her feet nightly; foot exam today by MD      Relevant Medications   simvastatin (ZOCOR) 20 MG tablet   Other Relevant Orders   Ambulatory referral to Podiatry   Microalbumin / creatinine urine ratio     Musculoskeletal and Integument   Rheumatoid arthritis ([place])    Encouraged her to discuss her concerns about DMARDS and potential side effects with her rheumatologist        Other   Vitamin D deficiency    Continue supplementation      High blood cholesterol    Taking statin; no symptoms; try to limit saturated fats      Relevant Medications   simvastatin (ZOCOR) 20 MG tablet   Bariatric surgery status    She cannot take aspirin  because of bariatric surgery status      Hyperkalemia    Check K+ today; she has modified her diet; avoiding ACE-I or ARB right now because of the hyperkalemia; will check urine microalbumin to see where that stands      Relevant Orders   Potassium   Macrocytosis    Check folic acid, UPEP, SPEP; last B12 showed adequate B12; she does not drink alcohol      Relevant Orders   Folate   Protein Electrophoresis, Urine Rflx.   Protein electrophoresis, serum   Depression, major, single episode, mild (HCC)    Supportive listening provided; she is not interested in referral to counseling; she may contact her church for counseling there; will start remeron for mood, which may also help her weight      Relevant Medications   mirtazapine (REMERON) 15 MG tablet      Follow up plan: Return in about 4 weeks (around 08/10/2015).  An after-visit summary was printed and given to the patient at [place].  Please see the patient instructions which may contain other information and recommendations beyond what is mentioned above in the assessment and plan.  Orders Placed This Encounter  Procedures  . Microalbumin / creatinine urine ratio  . Folate  . Protein Electrophoresis, Urine Rflx.  . Protein electrophoresis, serum  . Potassium  . Celiac Disease Ab Screen w/Rfx  . Ambulatory referral to Podiatry  . Ambulatory referral to Gastroenterology   Meds ordered this encounter  Medications  . mirtazapine (REMERON) 15 MG tablet    Sig: Take 1 tablet (15 mg total) by mouth at bedtime.    Dispense:  30 tablet    Refill:  2  . simvastatin (ZOCOR) 20 MG tablet    Sig: Take 20 mg by mouth at bedtime.   Medications Discontinued During This Encounter  Medication Reason  . sulfamethoxazole-trimethoprim (BACTRIM DS,SEPTRA DS) 800-160 MG tablet Completed Course  . Nintedanib (OFEV) 150 MG CAPS Patient Preference  . simvastatin (ZOCOR) 10 MG tablet Dose change   Face-to-face time with patient was  more than 40 minutes, >50% time spent counseling and coordination of care

## 2015-07-13 NOTE — Assessment & Plan Note (Signed)
Refer to podiatrist for evaluation, consideration of diabetic shoes; patient to check her feet nightly; foot exam today by MD

## 2015-07-13 NOTE — Assessment & Plan Note (Addendum)
Check K+ today; she has modified her diet; avoiding ACE-I or ARB right now because of the hyperkalemia; will check urine microalbumin to see where that stands

## 2015-07-13 NOTE — Assessment & Plan Note (Signed)
Supportive listening provided; she is not interested in referral to counseling; she may contact her church for counseling there; will start remeron for mood, which may also help her weight

## 2015-07-13 NOTE — Assessment & Plan Note (Signed)
Continue supplementation  ?

## 2015-07-13 NOTE — Patient Instructions (Addendum)
Call [phone] to get established with the [facility] school of dentistry We'll refer you to the podiatrist for your feet Your next labs will be due fasting on or just after April 20th (schedule visit today so you'll have that appointment ready for your diabetes, cholesterol, etc.) Return to see me in 4 weeks for your mood and to see how your medicine is doing; if you feel worse on the new medicine or have side effects, please call before your appointment I would recommend you start working with a counselor I would recommend a CT scan of your abdomen and pelvis, but respect your wishes; if you change your mind and would like to get the scan, then all you have to do is call and let us know We'll get you to see the gastroenterologist to see if there is a reason for your diarrhea and possibly not absorbing nutrients We'll let you know about your labs If you have not heard anything from my staff in a week about any orders/referrals/studies from today, please contact us here to follow-up [phone]

## 2015-07-13 NOTE — Assessment & Plan Note (Signed)
Check celiac panel and refer to GI

## 2015-07-13 NOTE — Assessment & Plan Note (Signed)
Check folic acid, UPEP, SPEP; last B12 showed adequate B12; she does not drink alcohol

## 2015-07-14 LAB — MICROALBUMIN / CREATININE URINE RATIO
Creatinine, Urine: 68.2 mg/dL
MICROALB/CREAT RATIO: 35.2 mg/g{creat} — AB (ref 0.0–30.0)
Microalbumin, Urine: 24 ug/mL

## 2015-07-14 LAB — FOLATE: FOLATE: 15.6 ng/mL (ref >3.0)

## 2015-07-14 LAB — POTASSIUM: POTASSIUM: 4 mmol/L (ref 3.5–5.2)

## 2015-07-15 ENCOUNTER — Telehealth: Payer: Self-pay | Admitting: Family Medicine

## 2015-07-15 DIAGNOSIS — R778 Other specified abnormalities of plasma proteins: Secondary | ICD-10-CM

## 2015-07-15 DIAGNOSIS — Z862 Personal history of diseases of the blood and blood-forming organs and certain disorders involving the immune mechanism: Secondary | ICD-10-CM

## 2015-07-15 LAB — PROTEIN ELECTROPHORESIS, SERUM
A/G Ratio: 0.8 (ref 0.7–1.7)
ALPHA 1: 0.3 g/dL (ref 0.0–0.4)
ALPHA 2: 1 g/dL (ref 0.4–1.0)
Albumin ELP: 3.3 g/dL (ref 2.9–4.4)
BETA: 1.1 g/dL (ref 0.7–1.3)
GAMMA GLOBULIN: 1.6 g/dL (ref 0.4–1.8)
Globulin, Total: 3.9 g/dL (ref 2.2–3.9)
M-Spike, %: 0.3 g/dL — ABNORMAL HIGH

## 2015-07-15 NOTE — Telephone Encounter (Signed)
SPEP shows M-spike; call patient later with results (it's Friday night right now)

## 2015-07-18 DIAGNOSIS — R778 Other specified abnormalities of plasma proteins: Secondary | ICD-10-CM | POA: Insufficient documentation

## 2015-07-18 DIAGNOSIS — Z862 Personal history of diseases of the blood and blood-forming organs and certain disorders involving the immune mechanism: Secondary | ICD-10-CM | POA: Insufficient documentation

## 2015-07-18 LAB — PROTEIN ELECTROPHORESIS, URINE REFLEX
ALPHA-1-GLOBULIN, U: 4.3 %
Albumin ELP, Urine: 27.5 %
Alpha-2-Globulin, U: 8.9 %
BETA GLOBULIN, U: 32.7 %
GAMMA GLOBULIN, U: 26.7 %
Protein, Ur: 15.3 mg/dL

## 2015-07-18 NOTE — Telephone Encounter (Signed)
Explained abnormal protein, may have something in bone marrow making this; need to see hematologist She prefers visit after 9:30 or 10:00 am To hematologist in December when back Celiac disease pending, will chck with lab; if not drawn, will have done with next set of labs instead of bringing her back in upep pending

## 2015-07-21 LAB — SPECIMEN STATUS REPORT

## 2015-07-21 LAB — CELIAC DISEASE AB SCREEN W/RFX: Transglutaminase IgA: <2 U/mL (ref 0–3)

## 2015-08-04 ENCOUNTER — Ambulatory Visit: Payer: Commercial Managed Care - HMO | Admitting: Podiatry

## 2015-08-04 ENCOUNTER — Inpatient Hospital Stay: Payer: Commercial Managed Care - HMO | Attending: Oncology | Admitting: Oncology

## 2015-08-04 VITALS — BP 141/78 | HR 90 | Temp 97.9°F | Resp 16 | Wt 97.7 lb

## 2015-08-04 DIAGNOSIS — Z7982 Long term (current) use of aspirin: Secondary | ICD-10-CM | POA: Diagnosis not present

## 2015-08-04 DIAGNOSIS — J8410 Pulmonary fibrosis, unspecified: Secondary | ICD-10-CM | POA: Insufficient documentation

## 2015-08-04 DIAGNOSIS — E119 Type 2 diabetes mellitus without complications: Secondary | ICD-10-CM | POA: Insufficient documentation

## 2015-08-04 DIAGNOSIS — D472 Monoclonal gammopathy: Secondary | ICD-10-CM | POA: Diagnosis not present

## 2015-08-04 DIAGNOSIS — E7800 Pure hypercholesterolemia, unspecified: Secondary | ICD-10-CM | POA: Insufficient documentation

## 2015-08-04 DIAGNOSIS — Z79899 Other long term (current) drug therapy: Secondary | ICD-10-CM | POA: Diagnosis not present

## 2015-08-04 DIAGNOSIS — M069 Rheumatoid arthritis, unspecified: Secondary | ICD-10-CM | POA: Insufficient documentation

## 2015-08-09 DIAGNOSIS — M810 Age-related osteoporosis without current pathological fracture: Secondary | ICD-10-CM | POA: Insufficient documentation

## 2015-08-09 DIAGNOSIS — M12842 Other specific arthropathies, not elsewhere classified, left hand: Secondary | ICD-10-CM | POA: Diagnosis not present

## 2015-08-09 DIAGNOSIS — M12871 Other specific arthropathies, not elsewhere classified, right ankle and foot: Secondary | ICD-10-CM | POA: Diagnosis not present

## 2015-08-09 DIAGNOSIS — M0579 Rheumatoid arthritis with rheumatoid factor of multiple sites without organ or systems involvement: Secondary | ICD-10-CM | POA: Diagnosis not present

## 2015-08-09 DIAGNOSIS — M12841 Other specific arthropathies, not elsewhere classified, right hand: Secondary | ICD-10-CM | POA: Diagnosis not present

## 2015-08-09 DIAGNOSIS — M12872 Other specific arthropathies, not elsewhere classified, left ankle and foot: Secondary | ICD-10-CM | POA: Diagnosis not present

## 2015-08-09 DIAGNOSIS — M47812 Spondylosis without myelopathy or radiculopathy, cervical region: Secondary | ICD-10-CM | POA: Diagnosis not present

## 2015-08-10 ENCOUNTER — Ambulatory Visit (INDEPENDENT_AMBULATORY_CARE_PROVIDER_SITE_OTHER): Payer: Commercial Managed Care - HMO | Admitting: Family Medicine

## 2015-08-10 ENCOUNTER — Encounter: Payer: Self-pay | Admitting: Family Medicine

## 2015-08-10 VITALS — BP 157/99 | HR 82 | Temp 98.0°F | Wt 97.0 lb

## 2015-08-10 DIAGNOSIS — R700 Elevated erythrocyte sedimentation rate: Secondary | ICD-10-CM | POA: Diagnosis not present

## 2015-08-10 DIAGNOSIS — K529 Noninfective gastroenteritis and colitis, unspecified: Secondary | ICD-10-CM

## 2015-08-10 DIAGNOSIS — R5383 Other fatigue: Secondary | ICD-10-CM | POA: Diagnosis not present

## 2015-08-10 DIAGNOSIS — E119 Type 2 diabetes mellitus without complications: Secondary | ICD-10-CM

## 2015-08-10 DIAGNOSIS — J8410 Pulmonary fibrosis, unspecified: Secondary | ICD-10-CM | POA: Diagnosis not present

## 2015-08-10 DIAGNOSIS — I2510 Atherosclerotic heart disease of native coronary artery without angina pectoris: Secondary | ICD-10-CM

## 2015-08-10 DIAGNOSIS — Z9884 Bariatric surgery status: Secondary | ICD-10-CM | POA: Diagnosis not present

## 2015-08-10 DIAGNOSIS — M069 Rheumatoid arthritis, unspecified: Secondary | ICD-10-CM | POA: Diagnosis not present

## 2015-08-10 MED ORDER — BENZONATATE 100 MG PO CAPS: 100.0000 mg | ORAL_CAPSULE | Freq: Three times a day (TID) | ORAL | Status: DC | PRN

## 2015-08-10 NOTE — Assessment & Plan Note (Addendum)
Get EKG today; cardiologist referral was entered in October; I talked with referral coordinator to expedite that getting set up ASAP; if chest pain, call 911; she is unable to take aspirin because of her gastric bypass status

## 2015-08-10 NOTE — Patient Instructions (Addendum)
Please do call the rheumatologist and ask about the very high sed rate (today) GINA-- please request the xray reports from [facility] and the heme-onc notes from [facility] see the cardiologist Please call me in one week with an update If you develop signs/symptoms of chest pain, then call 911 STOP the pantoprazole

## 2015-08-10 NOTE — Progress Notes (Signed)
BP 157/99 mmHg  Pulse 82  Temp(Src) 98 F (36.7 C)  Wt 97 lb (43.999 kg)  SpO2 97%   Subjective:    Patient ID: Anita Atkins, female    DOB: 01-01-1935, 80 y.o.   MRN: [id number]  HPI: Anita Atkins is a 80 y.o. female  Chief Complaint  Patient presents with  . Fatigue    she is still exhausted, doesn't know what to do about that  . Rheumatoid Arthritis    she went to the rheumatologist yesterday and she told her that she doesn't have RA   She saw the rheumatologist and she says she didn't have arthritis; no damage to the joints, did lots of xrays; stopped the RA medicine yesterday; she stopped the Ofev and thought it was causing the diarrhea; they drew a bunch of blood yesterday; four or five vials; she is fatigued and the rheumatologist did not say much about that; patient's daughter has fibromyalgia She had multiple xrays but we were not able to pull those reports up today  X-ray hand right 2 views (08/09/2015 3:11 PM) X-ray cervical spine 6 plus views (08/09/2015 3:10 PM) X-ray hand left 2 views (08/09/2015 3:10 PM) X-ray foot right 2 views (08/09/2015 3:09 PM) X-ray foot left 2 views (08/09/2015 3:09 PM) Lab drawn by rheum Component Name  08/09/2015    89 (H)   Sedimentation Rate-Automated   Last weekend she was in [place] to finish up business and had an awful time with her left shoulder and in the joint; hurt to move the shoulder joint, did not have the strength to move it; maybe picked something up; never any chest pain; we reviewed the last sed rate and it was 89 (quite high)  She is having tingling sensation in the left hand and arm; she thinks it might be positioning on the steering wheel, for the last week; she is seeing cardiologist soon; no chest pain whatsoever; started here after we did shoulder ROM activities and Tinel and Phalen's testing; she had a CABG in 2014; no stress test since then; when she gets the tingling sensation, there is no chest pain or pressure;  no shortness of breath and no nausea Last lipid panel was drawn June 15, 2015: Cholesterol, Total 100 - 199 mg/dL 173   Triglycerides 0 - 149 mg/dL 108   HDL >39 mg/dL 59   Comments: According to ATP-III Guidelines, HDL-C >59 mg/dL is considered a  negative risk factor for CHD.     VLDL Cholesterol Cal 5 - 40 mg/dL 22   LDL Calculated 0 - 99 mg/dL 92        She was found to have elevated M-spike and she saw Dr. Grayland Ormond; she told him that she bruised easily; has a few spots today, right volar forearm; he told her that lots of people have that in the blood, maybe 1 in a 100 might get problems but just follow and see her back in 6 months July 13, 2015: M-Spike, % Not Observed g/dL 0.3 (H)      GLOBULIN, TOTAL 2.2 - 3.9 g/dL 3.9 3.1R    A/G Ratio 0.7 - 1.7  0.8     Interpretation:  Comment    Comments: Faint band in gamma region suspicious for monoclonal immunoglobulin.        Does not have the PPI yet; sleeping with HOB elevated; no heartburn or reflux  She is taking tessalon perles twice a day; it does help when she takes them; needs  enough for 3 a day; requested new rx  Relevant past medical, surgical, family and social history reviewed and updated as indicated. Interim medical history since our last visit reviewed. Allergies and medications reviewed and updated.  Review of Systems Per HPI unless specifically indicated above     Objective:    BP 157/99 mmHg  Pulse 82  Temp(Src) 98 F (36.7 C)  Wt 97 lb (43.999 kg)  SpO2 97%  Wt Readings from Last 3 Encounters:  08/10/15 97 lb (43.999 kg)  08/04/15 97 lb 10.6 oz (44.3 kg)  07/13/15 98 lb (44.453 kg)    Physical Exam  Cardiovascular: Normal rate and regular rhythm.   Pulmonary/Chest: She has decreased breath sounds. She has rales.  Musculoskeletal:       Right shoulder: She exhibits normal range of motion.       Cervical back: She exhibits decreased range of motion. She exhibits no tenderness, no bony  tenderness and no swelling.  Neurological:  Patient unable to do Phalen's test, Tinel's negative; motor grip 5/5  Skin: Ecchymosis (few ecchymoses on sun exposed part of the right forearm) noted.   Results for orders placed or performed in visit on 07/13/15  Microalbumin / creatinine urine ratio  Result Value Ref Range   Creatinine, Urine 68.2 Not Estab. mg/dL   Microalbum.,U,Random 24.0 Not Estab. ug/mL   MICROALB/CREAT RATIO 35.2 (H) 0.0 - 30.0 mg/g creat  Folate  Result Value Ref Range   Folate 15.6 >3.0 ng/mL  Protein Electrophoresis, Urine Rflx.  Result Value Ref Range   Protein, Ur 15.3 Not Estab. mg/dL   Albumin ELP, Urine 27.5 %   Alpha-1-Globulin, U 4.3 %   Alpha-2-Globulin, U 8.9 %   Beta Globulin, U 32.7 %   Gamma Globulin, U 26.7 %   M Component, Ur Not Observed Not Observed %   PLEASE NOTE: Comment   Protein electrophoresis, serum  Result Value Ref Range   Albumin ELP 3.3 2.9 - 4.4 g/dL   Alpha 1 0.3 0.0 - 0.4 g/dL   Alpha 2 1.0 0.4 - 1.0 g/dL   Beta 1.1 0.7 - 1.3 g/dL   Gamma Globulin 1.6 0.4 - 1.8 g/dL   M-Spike, % 0.3 (H) Not Observed g/dL   GLOBULIN, TOTAL 3.9 2.2 - 3.9 g/dL   A/G Ratio 0.8 0.7 - 1.7   Interpretation: Comment   Potassium  Result Value Ref Range   Potassium 4.0 3.5 - 5.2 mmol/L  Specimen status report  Result Value Ref Range   specimen status report Comment   Celiac Disease Ab Screen w/Rfx  Result Value Ref Range   Transglutaminase IgA <2 0 - 3 U/mL      Assessment & Plan:   Problem List Items Addressed This Visit      Cardiovascular and Mediastinum   Coronary artery disease involving native coronary artery of native heart without angina pectoris (Chronic)    Get EKG today; cardiologist referral was entered in October; I talked with referral coordinator to expedite that getting set up ASAP; if chest pain, call 911; she is unable to take aspirin because of her gastric bypass status      Relevant Orders   EKG 12-Lead (Completed)       Respiratory   Pulmonary fibrosis (HCC) (Chronic)    Pulse ox 97% here today; following with pulmonologist        Digestive   Chronic diarrhea    Which could certain cause weakness, celiac testing was negative; may  be related to malabsorption from bariatric surgery        Endocrine   Type 2 diabetes mellitus without complication, without long-term current use of insulin (HCC) (Chronic)    Last A1c 6.8, so doubt her fatigue is coming from poorly controlled diabetes        Musculoskeletal and Integument   Rheumatoid arthritis (HCC) (Chronic)    Patient tells me today that her rheumatologist says she does NOT have rheumatoid arthritis, but her sed rate is 89; I asked the patient contact her rheumatologist to find out why they think her sed rate is elevated if she does not have inflammatory arthritis; explained that while ESR can be associated with inflammatory conditions like arthritis, could be cancer or any other number of things      Relevant Medications   acetaminophen (TYLENOL) 500 MG tablet     Other   Bariatric surgery status (Chronic)    Malabsorption issues could be part of the fatigue as well, keeping this in mind of our ddx      Fatigue - Primary    Patient has so many issues going on, with a wide ddx; we reviewed some these today including malignancy; I would like her to first contact her rheumatologist to dicsusse the high sed rate; if not related to an inflammatory condition like arthritis, we'll continue to look; I asked patient to give me a call in one week with an update; we reviewed numerous tests done so far to date      Elevated sedimentation rate    Sed rate of 89 at rheum; patient will call to verify if they think sed rate high because of her arthitis condition, because if not, we'll continue to look for other causes such as malignancy         Follow up plan: No Follow-up on file. Patient to call me with update by phone in one week  Face-to-face  time with patient was more than 40 minutes, >50% time spent counseling and coordination of care  An after-visit summary was printed and given to the patient at [place].  Please see the patient instructions which may contain other information and recommendations beyond what is mentioned above in the assessment and plan.

## 2015-08-12 ENCOUNTER — Telehealth: Payer: Self-pay | Admitting: Oncology

## 2015-08-12 NOTE — Telephone Encounter (Signed)
Patient went to Dr. Marzetta Board who was very concerned about her SED rate from labs she had drawn with [place]. Dr. Marzetta Board said they don't like to see it higher than 30 but patient's was near 100. Dr. Marzetta Board wants to make sure this wasn't a typo. Please call.

## 2015-08-12 NOTE — Telephone Encounter (Signed)
Curious why the sed rate was drawn in the first place. I'm not sure what to do with a sed rate that high since it can be increased with a bunch of different things. There does not appear to be a hematologic cause.  Can consider rheumatologic etiologies.

## 2015-08-12 NOTE — Telephone Encounter (Signed)
Spoke with patient regarding lab results, patient to call rheumatologist for further clarification.

## 2015-08-12 NOTE — Telephone Encounter (Signed)
Message forwarded to Dr. Grayland Ormond

## 2015-08-15 DIAGNOSIS — R700 Elevated erythrocyte sedimentation rate: Secondary | ICD-10-CM | POA: Insufficient documentation

## 2015-08-15 NOTE — Assessment & Plan Note (Signed)
Last A1c 6.8, so doubt her fatigue is coming from poorly controlled diabetes

## 2015-08-15 NOTE — Assessment & Plan Note (Signed)
Sed rate of 89 at rheum; patient will call to verify if they think sed rate high because of her arthitis condition, because if not, we'll continue to look for other causes such as malignancy

## 2015-08-15 NOTE — Assessment & Plan Note (Signed)
Malabsorption issues could be part of the fatigue as well, keeping this in mind of our ddx

## 2015-08-15 NOTE — Assessment & Plan Note (Signed)
Which could certain cause weakness, celiac testing was negative; may be related to malabsorption from bariatric surgery

## 2015-08-15 NOTE — Assessment & Plan Note (Signed)
Patient has so many issues going on, with a wide ddx; we reviewed some these today including malignancy; I would like her to first contact her rheumatologist to dicsusse the high sed rate; if not related to an inflammatory condition like arthritis, we'll continue to look; I asked patient to give me a call in one week with an update; we reviewed numerous tests done so far to date

## 2015-08-15 NOTE — Assessment & Plan Note (Signed)
Pulse ox 97% here today; following with pulmonologist

## 2015-08-15 NOTE — Assessment & Plan Note (Signed)
Patient tells me today that her rheumatologist says she does NOT have rheumatoid arthritis, but her sed rate is 89; I asked the patient contact her rheumatologist to find out why they think her sed rate is elevated if she does not have inflammatory arthritis; explained that while ESR can be associated with inflammatory conditions like arthritis, could be cancer or any other number of things

## 2015-08-16 DIAGNOSIS — R6881 Early satiety: Secondary | ICD-10-CM | POA: Diagnosis not present

## 2015-08-16 DIAGNOSIS — R1084 Generalized abdominal pain: Secondary | ICD-10-CM | POA: Diagnosis not present

## 2015-08-16 DIAGNOSIS — R634 Abnormal weight loss: Secondary | ICD-10-CM | POA: Diagnosis not present

## 2015-08-16 DIAGNOSIS — R197 Diarrhea, unspecified: Secondary | ICD-10-CM | POA: Diagnosis not present

## 2015-08-17 ENCOUNTER — Encounter: Payer: Self-pay | Admitting: Cardiovascular Disease

## 2015-08-17 ENCOUNTER — Ambulatory Visit (INDEPENDENT_AMBULATORY_CARE_PROVIDER_SITE_OTHER): Payer: Commercial Managed Care - HMO | Admitting: Cardiovascular Disease

## 2015-08-17 VITALS — BP 110/62 | HR 91 | Ht 61.0 in | Wt 98.5 lb

## 2015-08-17 DIAGNOSIS — R200 Anesthesia of skin: Secondary | ICD-10-CM | POA: Diagnosis not present

## 2015-08-17 DIAGNOSIS — R202 Paresthesia of skin: Secondary | ICD-10-CM

## 2015-08-17 DIAGNOSIS — E1142 Type 2 diabetes mellitus with diabetic polyneuropathy: Secondary | ICD-10-CM

## 2015-08-17 DIAGNOSIS — R5383 Other fatigue: Secondary | ICD-10-CM

## 2015-08-17 DIAGNOSIS — I2510 Atherosclerotic heart disease of native coronary artery without angina pectoris: Secondary | ICD-10-CM | POA: Diagnosis not present

## 2015-08-17 DIAGNOSIS — R079 Chest pain, unspecified: Secondary | ICD-10-CM

## 2015-08-17 DIAGNOSIS — J8410 Pulmonary fibrosis, unspecified: Secondary | ICD-10-CM

## 2015-08-17 DIAGNOSIS — R0602 Shortness of breath: Secondary | ICD-10-CM | POA: Diagnosis not present

## 2015-08-17 DIAGNOSIS — K529 Noninfective gastroenteritis and colitis, unspecified: Secondary | ICD-10-CM

## 2015-08-17 DIAGNOSIS — E7800 Pure hypercholesterolemia, unspecified: Secondary | ICD-10-CM

## 2015-08-17 DIAGNOSIS — E119 Type 2 diabetes mellitus without complications: Secondary | ICD-10-CM

## 2015-08-17 MED ORDER — SIMVASTATIN 40 MG PO TABS: 40.0000 mg | ORAL_TABLET | Freq: Every day | ORAL | Status: DC

## 2015-08-17 NOTE — Assessment & Plan Note (Signed)
She reports diarrhea has dramatically improved. She feels this was secondary to medications  Diarrhea likely responsible also  For some weight loss

## 2015-08-17 NOTE — Assessment & Plan Note (Signed)
Managed by pulmonary  Significant shortness of breath on exertion which she attributes to her underlying lung disease

## 2015-08-17 NOTE — Progress Notes (Signed)
Patient ID: Anita Atkins, female    DOB: 03-14-1935, 80 y.o.   MRN: [id number]  HPI Comments:  Anita Atkins  Is a very pleasant 80 year old woman with history of pulmonary fibrosis, chronic shortness of breath,  Followed by pulmonary,  Diabetes with hemoglobin A1c 6.8 , history of gastric bypass, coronary artery disease with proximal LAD stenosis, CABG with vein graft to the LAD per the patient ( records have been requested),  Presented to establish care for coronary artery disease.   She reports that she had surgery generated 2014 at [facility] in [place], [place].  Typical anginal symptoms included chest tightness radiating up to her jaw and left arm.  She denies any recent symptoms concerning for angina   She is limited in her ability to exert herself secondary to shortness of breath on exertion.  Currently not on oxygen.  She is on OFEV  Twice a day which she reports helps her breathing.   Lab work reviewed with her showing hemoglobin A1c 6.8.  She reports that she does not take diabetes medications, she was on medications in the past and she stopped these.    She used to be on simvastatin 80 mg daily  For some reason is taking 20 mg daily now  Denies having problems on the higher dose, no myalgias in the past  Total cholesterol above goal, 170s , LDL 90   No regular exercise program, limited by her shortness of breath   EKG on today's visit shows normal sinus rhythm with rate 91 bpm, no significant ST or T-wave changes     No Known Allergies  Current Outpatient Prescriptions on File Prior to Visit  Medication Sig Dispense Refill  . acetaminophen (TYLENOL) 500 MG tablet Take 500 mg by mouth.    Marland Kitchen azelastine (ASTELIN) 0.1 % nasal spray     . benzonatate (TESSALON) 100 MG capsule Take 1 capsule (100 mg total) by mouth every 8 (eight) hours as needed for cough. 90 capsule 3  . Cholecalciferol (VITAMIN D) 2000 UNITS CAPS Take 2,000 Units by mouth daily.    .  mirtazapine (REMERON) 15 MG tablet Take 1 tablet (15 mg total) by mouth at bedtime. 30 tablet 2  . Multiple Minerals-Vitamins (CALCIUM & VIT D3 BONE HEALTH PO) Take by mouth daily.     Marland Kitchen OFEV 150 MG CAPS 2 (two) times daily.     . pantoprazole (PROTONIX) 20 MG tablet Take 1 tablet (20 mg total) by mouth daily. 90 tablet 1  . traMADol (ULTRAM) 50 MG tablet Take 50 mg by mouth as needed.      No current facility-administered medications on file prior to visit.    Past Medical History  Diagnosis Date  . Diabetes mellitus without complication ([place])   . High cholesterol   . RA (rheumatoid arthritis) ([place])   . Pulmonary fibrosis [facility])     Past Surgical History  Procedure Laterality Date  . Gastric bypass  2007  . Cardiac surgery    . Exploration post operative open heart    . Cholecystectomy      Social History  reports that she has never smoked. She has never used smokeless tobacco. She reports that she does not drink alcohol or use illicit drugs.  Family History family history includes Cancer in her maternal aunt; Diabetes in her sister; Heart disease in her mother; Hyperlipidemia in her sister; Stroke in her mother. There is no history of COPD or Hypertension.  Review of Systems  Constitutional: Negative.   Respiratory: Negative.   Cardiovascular: Negative.   Gastrointestinal: Negative.   Musculoskeletal: Negative.   Neurological: Negative.   Hematological: Negative.   Psychiatric/Behavioral: Negative.   All other systems reviewed and are negative.   BP 110/62 mmHg  Pulse 91  Ht 5\' 1"  (1.549 m)  Wt 98 lb 8 oz (44.679 kg)  BMI 18.62 kg/m2  Physical Exam  Constitutional: She is oriented to person, place, and time.   Very thin  HENT:  Head: Normocephalic.  Nose: Nose normal.  Mouth/Throat: Oropharynx is clear and moist.  Eyes: Conjunctivae are normal. Pupils are equal, round, and reactive to light.  Neck: Normal range of motion. Neck supple. No JVD present.   Cardiovascular: Normal rate, regular rhythm, normal heart sounds and intact distal pulses.  Exam reveals no gallop and no friction rub.   No murmur heard. Pulmonary/Chest: Effort normal. No respiratory distress. She has no wheezes. She has rales. She exhibits no tenderness.   Diffuse crackles anteriorly, posteriorly throughout worse at the bases halfway up  Abdominal: Soft. Bowel sounds are normal. She exhibits no distension. There is no tenderness.  Musculoskeletal: Normal range of motion. She exhibits no edema or tenderness.  Lymphadenopathy:    She has no cervical adenopathy.  Neurological: She is alert and oriented to person, place, and time. Coordination normal.  Skin: Skin is warm and dry. No rash noted. No erythema.  Psychiatric: She has a normal mood and affect. Her behavior is normal. Judgment and thought content normal.

## 2015-08-17 NOTE — Assessment & Plan Note (Signed)
LDL above goal, recommended she increase simvastatin up to 40 mg daily  If numbers continue to run high, could add zetia  10 mg daily

## 2015-08-17 NOTE — Patient Instructions (Addendum)
You are doing well.  Please increase the simvastatin up to 40 mg daily Goal LDL <70 (we were at 90)  Please call us if you have new issues that need to be addressed before your next appt.  Your physician wants you to follow-up in: 6 months.  You will receive a reminder letter in the mail two months in advance. If you don't receive a letter, please call our office to schedule the follow-up appointment.

## 2015-08-17 NOTE — Assessment & Plan Note (Signed)
Currently with no symptoms of angina. No further workup at this time. Continue current medication regimen.  records have been requested  By her report, likely has vein graft to the LAD.  no further workup since that time, no stress test or catheterizations

## 2015-08-17 NOTE — Assessment & Plan Note (Signed)
Chronic fatigue, etiology unclear. Less likely cardiac as she does not have any of her usual anginal symptoms.  Unable to exercise, sedentary at baseline , limited by her breathing  Possibly exacerbated by depression

## 2015-08-17 NOTE — Assessment & Plan Note (Signed)
She reports having numbness in her feet  Stressed importance of aggressive diabetes control

## 2015-08-17 NOTE — Assessment & Plan Note (Signed)
Hemoglobin A1c  6.8  Suggested she talk with Dr. Sanda Klein,  About management and how aggressive to be with her sugars

## 2015-08-19 DIAGNOSIS — E119 Type 2 diabetes mellitus without complications: Secondary | ICD-10-CM | POA: Diagnosis not present

## 2015-08-19 LAB — HM DIABETES EYE EXAM

## 2015-08-26 NOTE — Progress Notes (Signed)
[facility]  [phone]) [phone] [phone]) [phone]  ID: Leitha Bleak OB: 01/13/1935  MR#: [id number]  AOZ#:[id number]  Patient Care Team: Arnetha Courser, MD as PCP - General (Family Medicine) Lloyd Huger, MD as Consulting Physician (Oncology) Flora Lipps, MD (Pulmonary Disease) Marlowe Sax, MD as Referring Physician (Internal Medicine) Julieanne Manson Leeanne Mannan., MD (Rheumatology) Josefine Class, MD as Referring Physician (Gastroenterology) [place] (Optometry)  CHIEF COMPLAINT:  Chief Complaint  Patient presents with  . New Evaluation    abnormal labs    INTERVAL HISTORY: Patient is a 80 year old female who was found to have an elevated protein level on routine blood work. Subsequent workup revealed a mild M spike on her SPEP. Currently, she feels well and is asymptomatic. She has no neurologic point. She denies any pain. She denies any recent fevers or illnesses. She has no chest pain or shortness of breath. She has a good appetite and denies weight loss. She denies any nausea, vomiting, constipation, or diarrhea. She has no urinary complaints. Patient feels at her baseline and offers no specific complaints today.  REVIEW OF SYSTEMS:   Review of Systems  Constitutional: Negative.  Negative for fever and malaise/fatigue.  Respiratory: Negative for shortness of breath.   Cardiovascular: Negative.  Negative for chest pain.  Gastrointestinal: Negative.   Musculoskeletal: Negative.   Neurological: Negative.  Negative for weakness.    As per HPI. Otherwise, a complete review of systems is negatve.  PAST MEDICAL HISTORY: Past Medical History  Diagnosis Date  . Diabetes mellitus without complication ([place])   . High cholesterol   . RA (rheumatoid arthritis) ([place])   . Pulmonary fibrosis ([place])     PAST SURGICAL HISTORY: Past Surgical History  Procedure Laterality Date  . Gastric bypass  2007  . Cardiac surgery    .  Exploration post operative open heart    . Cholecystectomy      FAMILY HISTORY Family History  Problem Relation Age of Onset  . Heart disease Mother   . Stroke Mother   . Diabetes Sister   . Hyperlipidemia Sister   . Cancer Maternal Aunt     female  . COPD Neg Hx   . Hypertension Neg Hx        ADVANCED DIRECTIVES:    HEALTH MAINTENANCE: Social History  Substance Use Topics  . Smoking status: Never Smoker   . Smokeless tobacco: Never Used  . Alcohol Use: No     Colonoscopy:  PAP:  Bone density:  Lipid panel:  No Known Allergies  Current Outpatient Prescriptions  Medication Sig Dispense Refill  . azelastine (ASTELIN) 0.1 % nasal spray     . mirtazapine (REMERON) 15 MG tablet Take 1 tablet (15 mg total) by mouth at bedtime. 30 tablet 2  . Multiple Minerals-Vitamins (CALCIUM & VIT D3 BONE HEALTH PO) Take by mouth daily.     . pantoprazole (PROTONIX) 20 MG tablet Take 1 tablet (20 mg total) by mouth daily. 90 tablet 1  . traMADol (ULTRAM) 50 MG tablet Take 50 mg by mouth as needed.     Marland Kitchen acetaminophen (TYLENOL) 500 MG tablet Take 500 mg by mouth.    Marland Kitchen aspirin EC 81 MG tablet Take 1 tablet (81 mg total) by mouth daily. 90 tablet 3  . benzonatate (TESSALON) 100 MG capsule Take 1 capsule (100 mg total) by mouth every 8 (eight) hours as needed for cough. 90 capsule 3  . Cholecalciferol (VITAMIN D) 2000 UNITS  CAPS Take 2,000 Units by mouth daily.    Marland Kitchen OFEV 150 MG CAPS 2 (two) times daily.     . simvastatin (ZOCOR) 40 MG tablet Take 1 tablet (40 mg total) by mouth at bedtime. 90 tablet 3   No current facility-administered medications for this visit.    OBJECTIVE: Filed Vitals:   08/04/15 1512  BP: 141/78  Pulse: 90  Temp: 97.9 F (36.6 C)  Resp: 16     Body mass index is 17.86 kg/(m^2).    ECOG FS:0 - Asymptomatic  General: Well-developed, well-nourished, no acute distress. Eyes: Pink conjunctiva, anicteric sclera. HEENT: Normocephalic, moist mucous membranes,  clear oropharnyx. Lungs: Clear to auscultation bilaterally. Heart: Regular rate and rhythm. No rubs, murmurs, or gallops. Abdomen: Soft, nontender, nondistended. No organomegaly noted, normoactive bowel sounds. Musculoskeletal: No edema, cyanosis, or clubbing. Neuro: Alert, answering all questions appropriately. Cranial nerves grossly intact. Skin: No rashes or petechiae noted. Psych: Normal affect. Lymphatics: No cervical, calvicular, axillary or inguinal LAD.   LAB RESULTS:  Lab Results  Component Value Date   NA 136 06/15/2015   K 4.0 07/13/2015   CL 99 06/15/2015   CO2 25 06/15/2015   GLUCOSE 111* 06/15/2015   BUN 13 06/15/2015   CREATININE 0.89 06/15/2015   CALCIUM 9.3 06/15/2015   PROT 7.1 06/15/2015   ALBUMIN 4.0 06/15/2015   AST 25 06/15/2015   ALT 17 06/15/2015   ALKPHOS 86 06/15/2015   BILITOT 0.4 06/15/2015   GFRNONAA 62 06/15/2015   GFRAA 72 06/15/2015    Lab Results  Component Value Date   WBC 6.3 06/15/2015   NEUTROABS 3.7 06/15/2015   HCT 37.6 06/15/2015     STUDIES: No results found.  ASSESSMENT: MGUS.  PLAN:    1. MGUS: Patient was found to have a mildly elevated M spike of 0.3. She does not have an M spike in her urine. The remainder of her laboratory work is either negative or within normal limits. She has no evidence of endorgan damage. No intervention is needed at this time. Patient does not require bone marrow biopsy. She does not need a metastatic bone survey at this time, but will consider one in the future if there is suspicion of progression of disease. Return to clinic in 6 months with repeat laboratory work and further evaluation.  Patient expressed understanding and was in agreement with this plan. She also understands that She can call clinic at any time with any questions, concerns, or complaints.   Lloyd Huger, MD   08/26/2015 1:10 PM

## 2015-09-01 ENCOUNTER — Ambulatory Visit: Payer: Commercial Managed Care - HMO | Admitting: Podiatry

## 2015-10-20 DIAGNOSIS — J441 Chronic obstructive pulmonary disease with (acute) exacerbation: Secondary | ICD-10-CM | POA: Diagnosis not present

## 2015-10-20 DIAGNOSIS — J84112 Idiopathic pulmonary fibrosis: Secondary | ICD-10-CM | POA: Diagnosis not present

## 2015-11-10 DIAGNOSIS — D472 Monoclonal gammopathy: Secondary | ICD-10-CM | POA: Diagnosis not present

## 2015-11-10 DIAGNOSIS — Z79899 Other long term (current) drug therapy: Secondary | ICD-10-CM | POA: Diagnosis not present

## 2015-11-10 DIAGNOSIS — J8410 Pulmonary fibrosis, unspecified: Secondary | ICD-10-CM | POA: Diagnosis not present

## 2015-11-10 DIAGNOSIS — M0579 Rheumatoid arthritis with rheumatoid factor of multiple sites without organ or systems involvement: Secondary | ICD-10-CM | POA: Diagnosis not present

## 2015-11-10 DIAGNOSIS — M810 Age-related osteoporosis without current pathological fracture: Secondary | ICD-10-CM | POA: Diagnosis not present

## 2015-11-10 DIAGNOSIS — E559 Vitamin D deficiency, unspecified: Secondary | ICD-10-CM | POA: Diagnosis not present

## 2015-12-07 ENCOUNTER — Encounter: Payer: Self-pay | Admitting: Internal Medicine

## 2015-12-07 ENCOUNTER — Ambulatory Visit (INDEPENDENT_AMBULATORY_CARE_PROVIDER_SITE_OTHER): Payer: Commercial Managed Care - HMO | Admitting: Internal Medicine

## 2015-12-07 ENCOUNTER — Encounter (INDEPENDENT_AMBULATORY_CARE_PROVIDER_SITE_OTHER): Payer: Self-pay

## 2015-12-07 VITALS — BP 120/62 | HR 98 | Ht 61.5 in | Wt 95.0 lb

## 2015-12-07 DIAGNOSIS — K219 Gastro-esophageal reflux disease without esophagitis: Secondary | ICD-10-CM | POA: Diagnosis not present

## 2015-12-07 MED ORDER — PANTOPRAZOLE SODIUM 20 MG PO TBEC: 20.0000 mg | DELAYED_RELEASE_TABLET | Freq: Every day | ORAL | Status: DC

## 2015-12-07 NOTE — Progress Notes (Signed)
[facility] Consultation      Date: 12/07/2015,   MRN# [id number] Anita Atkins 08-24-1935 Code Status:  Hosp day:@LENGTHOFSTAYDAYS @ Referring MD: @ATDPROV @     PCP:      AdmissionWeight: 95 lb (43.092 kg)                 CurrentWeight: 95 lb (43.092 kg) Anita Atkins is a 81 y.o. old female seen in consultation for pulmonary fibrosis     CHIEF COMPLAINT:  Follow Chronic SOB and WOB H/o pulmonary fibrosis    HISTORY OF PRESENT ILLNESS  81 yo white female seen today for previous dx of pulm fibrosis apporx 10-15 years ago Her symptoms are SOB and DOE chronic, associated with weakness and palpitations with exertion, Patient risk factors includes heavy second hand smoke exposure,  Patient stopped OFEV 1  Patient has no fevers, chills at this time, no evidence of infection Patient sitting up and breathing comfortably  Patient traveled to [facility] to visit friend and has some acute SOB with exertion, patient was given symbicort and albuterol as needed and that has helped her  6MWT 1000 feet no oxygen desats PFT 07/08/15 Ratio 92%  FVC 2.3 L 51% TLC 4.6 58% DLCO 48%       Current Medication:  Current outpatient prescriptions:  .  acetaminophen (TYLENOL) 500 MG tablet, Take 500 mg by mouth., Disp: , Rfl:  .  aspirin EC 81 MG tablet, Take 1 tablet (81 mg total) by mouth daily., Disp: 90 tablet, Rfl: 3 .  azelastine (ASTELIN) 0.1 % nasal spray, , Disp: , Rfl:  .  benzonatate (TESSALON) 100 MG capsule, Take 1 capsule (100 mg total) by mouth every 8 (eight) hours as needed for cough., Disp: 90 capsule, Rfl: 3 .  Cholecalciferol (VITAMIN D) 2000 UNITS CAPS, Take 2,000 Units by mouth daily., Disp: , Rfl:  .  mirtazapine (REMERON) 15 MG tablet, Take 1 tablet (15 mg total) by mouth at bedtime., Disp: 30 tablet, Rfl: 2 .  Multiple Minerals-Vitamins (CALCIUM & VIT D3 BONE HEALTH PO), Take by mouth daily. , Disp: , Rfl:  .  OFEV 150 MG CAPS, 2 (two) times daily. , Disp:  , Rfl:  .  pantoprazole (PROTONIX) 20 MG tablet, Take 1 tablet (20 mg total) by mouth daily., Disp: 90 tablet, Rfl: 1 .  simvastatin (ZOCOR) 40 MG tablet, Take 1 tablet (40 mg total) by mouth at bedtime., Disp: 90 tablet, Rfl: 3 .  traMADol (ULTRAM) 50 MG tablet, Take 50 mg by mouth as needed. , Disp: , Rfl:  .  SYMBICORT 160-4.5 MCG/ACT inhaler, , Disp: , Rfl:  .  VENTOLIN HFA 108 (90 Base) MCG/ACT inhaler, , Disp: , Rfl:     ALLERGIES   Review of patient's allergies indicates no known allergies.     REVIEW OF SYSTEMS   Review of Systems  Constitutional: Negative for fever, chills, weight loss and malaise/fatigue.  HENT: Negative for congestion.   Eyes: Negative for blurred vision and double vision.  Respiratory: Positive for shortness of breath. Negative for cough.   Cardiovascular: Positive for palpitations. Negative for chest pain and leg swelling.  Gastrointestinal: Negative for heartburn, nausea, vomiting and abdominal pain.  Genitourinary: Negative.   Musculoskeletal: Negative for myalgias.  Skin: Negative for rash.  Neurological: Negative for dizziness, weakness and headaches.  Endo/Heme/Allergies: Does not bruise/bleed easily.  Psychiatric/Behavioral: Negative for depression. The patient is not nervous/anxious.   All other systems reviewed and are negative.  VS: BP 120/62 mmHg  Pulse 98  Ht 5' 1.5" (1.562 m)  Wt 95 lb (43.092 kg)  BMI 17.66 kg/m2  SpO2 93%     PHYSICAL EXAM  Physical Exam  Constitutional: She is oriented to person, place, and time. No distress.  Thin and cachectic  Eyes: EOM are normal.  Cardiovascular: Normal rate, regular rhythm and normal heart sounds.   No murmur heard. Pulmonary/Chest: No stridor. No respiratory distress. She has no wheezes. She has rales.  Musculoskeletal: Normal range of motion. She exhibits no edema.  Neurological: She is alert and oriented to person, place, and time.  Skin: She is not diaphoretic.    Psychiatric: She has a normal mood and affect.         CXR on 06/11/2015 Images reviewed on 12/07/2015       ASSESSMENT/PLAN   81 yo very pleasant white female with Pulmonary Fibrosis(no biopsy) for many years with chronic SOB and DOE with chronic cough and intermittent chest tightness  1.will need to obtain 6 MWT test and PFT to assess oxygen needs 2.needs overnight Pulse Ox 3.start PPI 4.continue cough suppressant as needed 5.follow up with cardiology as needed with dr Rockey Situ 6.follow up GI consults for h/o gastric bypass 7.use symbicort and albuterol as needed   Follow up in 3 months   I have personally obtained a history, examined the patient, evaluated laboratory and independently reviewed imaging results, formulated the assessment and plan and placed orders.  The Patient requires high complexity decision making for assessment and support, frequent evaluation and titration of therapies, application of advanced monitoring technologies and extensive interpretation of multiple databases.  Patient satisfied with Plan of action and management. All questions answered  Corrin Parker, M.D.  [facility]  Medical Director [place] Director [facility] Department

## 2015-12-07 NOTE — Patient Instructions (Signed)
Pulmonary Fibrosis °Pulmonary fibrosis is a type of lung disease that causes scarring. Over time, the scar tissue (fibrosis) builds up in the air sacs of your lungs. This makes it hard for you to breathe. Less oxygen can get into your blood. °Scarring from pulmonary fibrosis gets worse over time. This damage is permanent. Having damaged lungs may make it more likely that you will have heart problems as well. °CAUSES °Usually, the cause of pulmonary fibrosis is not known (idiopathic pulmonary fibrosis). However, pulmonary fibrosis can be caused by:  °· Exposure to occupational and environmental toxins. These include asbestos, silica, and metal dusts. °· Inhaling moldy hay. This can cause an allergic reaction in the lung (farmer's lung) that can lead to pulmonary fibrosis. °· Inhaling toxic fumes. °· Certain medicines. These include drugs used in radiation therapy or used to treat seizures, heart problems, and some infections. °· Autoimmune diseases, such as rheumatoid arthritis, systemic sclerosis, and connective tissue diseases. °· Sarcoidosis. In this disease, areas of inflammatory cells (granulomas) form and most often affect the lungs. °· Genes. Some cases of pulmonary fibrosis may be passed down through families. °RISK FACTORS °You may be at a higher risk for developing pulmonary fibrosis if: °· You have a family history of the disease. °· You have an autoimmune disease or another condition linked to pulmonary fibrosis. °· You are exposed to certain substances or fumes found in agricultural, farm, construction, or factory work. °· You take certain medicines. °SIGNS AND SYMPTOMS °Symptoms may include: °· Difficulty breathing that gets worse with activity. °· Dry, hacking cough. °· Rapid, shallow breathing during exercise or while at rest. °· Shortness of breath that gets worse (dyspnea). °· Bluish skin and lips. °· Loss of appetite. °· Loss of strength. °· Weight loss and fatigue. °· Rounded and enlarged  fingertips (clubbing). °DIAGNOSIS °Your health care provider may suspect pulmonary fibrosis based on your symptoms and medical history. Diagnosis may include a physical exam. Your health care provider will check for signs that strongly suggest that you have pulmonary fibrosis, such as: °· Blue skin around your fingernails or mouth from reduced oxygen. °· Clubbing around the ends of your fingers. °· A crackling sound when you breathe. °Your health care provider may also do tests to confirm the diagnosis. These may include: °· Looking inside your lungs with an instrument (bronchoscopy). °· Imaging studies of your lungs and heart using: °¨ X-rays. °¨ CT scan. °¨ Sound waves (echocardiogram). °· Tests to measure how well you are breathing (pulmonary function tests). °· Exercise testing to see how well your lungs work while you are walking. °· Blood tests. °· A procedure to remove a small piece of lung tissue to examine in a lab (biopsy). °TREATMENT °There is no cure for pulmonary fibrosis. Treatments focus on managing symptoms and preventing scarring from getting worse. This can include: °· Medicines. °¨ You may take steroids to prevent permanent lung changes. Your health care provider may put you on a high dose at first, then on lower dosages for the long term. °¨ Medicines to suppress your body's defense (immune) system. These can have serious side effects. °· You may be monitored with X-rays and laboratory work. °· Oxygen therapy may be helpful if oxygen in your blood is low. °· Surgery. In some cases, a lung transplant is an option. °HOME CARE INSTRUCTIONS °· Take medicines only as directed by your health care provider. °· Keep your vaccinations up to date as recommended by your health care provider.  °·   Do not use any tobacco products, including cigarettes, chewing tobacco, or electronic cigarettes. If you need help quitting, ask your health care provider. °· Get regular exercise, but do not overexert yourself. Ask  your health care provider to suggest some activities that are safe for you to do. Walking and chair exercises can help if you have physical limitations. °· Consider joining a pulmonary rehabilitation program or a support group for people with pulmonary fibrosis. °· Eat small meals often so you do not get too full. Overeating can make breathing trouble worse. °· Maintain a healthy weight. Lose weight if you need to. °· Do breathing exercises as directed by your health care provider. °· Keep all follow-up visits as directed by your health care provider. This is important. °SEEK MEDICAL CARE IF: °· You are not able to be as active as usual. °· You have a long-lasting (chronic) cough. °· You are often short of breath. °· You have a fever or chills. °SEEK IMMEDIATE MEDICAL CARE IF: °· You have chest pain. °· Your breathing is much worse. °· You cannot take a deep breath. °· You have blue skin around your mouth or fingers. °· You have clubbing of your fingers. °· You cough up mucus that is dark in color. °· You have a lot of headaches. °· You get very confused or sleepy. °  °This information is not intended to replace advice given to you by your health care provider. Make sure you discuss any questions you have with your health care provider. °  °Document Released: 11/03/2003 Document Revised: 09/03/2014 Document Reviewed: 01/21/2014 °Elsevier Interactive Patient Education ©2016 [employer] ° °

## 2015-12-08 DIAGNOSIS — R197 Diarrhea, unspecified: Secondary | ICD-10-CM | POA: Diagnosis not present

## 2015-12-08 DIAGNOSIS — R6881 Early satiety: Secondary | ICD-10-CM | POA: Diagnosis not present

## 2015-12-08 DIAGNOSIS — R1084 Generalized abdominal pain: Secondary | ICD-10-CM | POA: Diagnosis not present

## 2015-12-08 DIAGNOSIS — R634 Abnormal weight loss: Secondary | ICD-10-CM | POA: Diagnosis not present

## 2015-12-21 ENCOUNTER — Ambulatory Visit: Payer: Commercial Managed Care - HMO | Admitting: Family Medicine

## 2016-01-07 DIAGNOSIS — B029 Zoster without complications: Secondary | ICD-10-CM | POA: Diagnosis not present

## 2016-01-25 DIAGNOSIS — E1143 Type 2 diabetes mellitus with diabetic autonomic (poly)neuropathy: Secondary | ICD-10-CM | POA: Diagnosis not present

## 2016-01-25 DIAGNOSIS — E860 Dehydration: Secondary | ICD-10-CM | POA: Diagnosis not present

## 2016-01-25 DIAGNOSIS — R531 Weakness: Secondary | ICD-10-CM | POA: Diagnosis not present

## 2016-01-25 DIAGNOSIS — R42 Dizziness and giddiness: Secondary | ICD-10-CM | POA: Diagnosis not present

## 2016-01-25 DIAGNOSIS — K3184 Gastroparesis: Secondary | ICD-10-CM | POA: Diagnosis not present

## 2016-01-31 ENCOUNTER — Telehealth: Payer: Self-pay | Admitting: Family Medicine

## 2016-01-31 ENCOUNTER — Encounter: Payer: Self-pay | Admitting: Family Medicine

## 2016-01-31 DIAGNOSIS — D472 Monoclonal gammopathy: Secondary | ICD-10-CM | POA: Insufficient documentation

## 2016-01-31 HISTORY — DX: Monoclonal gammopathy: D47.2

## 2016-01-31 NOTE — Telephone Encounter (Signed)
Entered, thank you!

## 2016-01-31 NOTE — Telephone Encounter (Signed)
Anita Atkins from the cancer center is needing a [facility] referral. DX Code: D47.2 for Dr Grayland Ormond

## 2016-02-02 ENCOUNTER — Other Ambulatory Visit: Payer: Self-pay

## 2016-02-02 ENCOUNTER — Inpatient Hospital Stay: Payer: Commercial Managed Care - HMO | Attending: Oncology

## 2016-02-02 DIAGNOSIS — R630 Anorexia: Secondary | ICD-10-CM | POA: Diagnosis not present

## 2016-02-02 DIAGNOSIS — D472 Monoclonal gammopathy: Secondary | ICD-10-CM

## 2016-02-02 DIAGNOSIS — R634 Abnormal weight loss: Secondary | ICD-10-CM | POA: Diagnosis not present

## 2016-02-02 DIAGNOSIS — Z9884 Bariatric surgery status: Secondary | ICD-10-CM | POA: Insufficient documentation

## 2016-02-02 DIAGNOSIS — J8410 Pulmonary fibrosis, unspecified: Secondary | ICD-10-CM | POA: Insufficient documentation

## 2016-02-02 DIAGNOSIS — E119 Type 2 diabetes mellitus without complications: Secondary | ICD-10-CM | POA: Diagnosis not present

## 2016-02-02 DIAGNOSIS — M069 Rheumatoid arthritis, unspecified: Secondary | ICD-10-CM | POA: Insufficient documentation

## 2016-02-02 DIAGNOSIS — E7800 Pure hypercholesterolemia, unspecified: Secondary | ICD-10-CM | POA: Diagnosis not present

## 2016-02-02 DIAGNOSIS — Z79899 Other long term (current) drug therapy: Secondary | ICD-10-CM | POA: Insufficient documentation

## 2016-02-02 LAB — BASIC METABOLIC PANEL
Anion gap: 6 (ref 5–15)
BUN: 16 mg/dL (ref 6–20)
CHLORIDE: 101 mmol/L (ref 101–111)
CO2: 26 mmol/L (ref 22–32)
Calcium: 8.8 mg/dL — ABNORMAL LOW (ref 8.9–10.3)
Creatinine, Ser: 0.59 mg/dL (ref 0.44–1.00)
GFR calc Af Amer: >60 mL/min (ref >60)
GFR calc non Af Amer: >60 mL/min (ref >60)
GLUCOSE: 172 mg/dL — AB (ref 65–99)
POTASSIUM: 4.3 mmol/L (ref 3.5–5.1)
SODIUM: 133 mmol/L — AB (ref 135–145)

## 2016-02-02 LAB — CBC
HCT: 37.8 % (ref 35.0–47.0)
HEMOGLOBIN: 13.3 g/dL (ref 12.0–16.0)
MCH: 34.5 pg — AB (ref 26.0–34.0)
MCHC: 35.2 g/dL (ref 32.0–36.0)
MCV: 98.1 fL (ref 80.0–100.0)
Platelets: 354 10*3/uL (ref 150–440)
RBC: 3.85 MIL/uL (ref 3.80–5.20)
RDW: 14.5 % (ref 11.5–14.5)
WBC: 8.9 10*3/uL (ref 3.6–11.0)

## 2016-02-03 LAB — PROTEIN ELECTROPHORESIS, SERUM
A/G RATIO SPE: 0.8 (ref 0.7–1.7)
ALPHA-2-GLOBULIN: 1.1 g/dL — AB (ref 0.4–1.0)
Albumin ELP: 3.1 g/dL (ref 2.9–4.4)
Alpha-1-Globulin: 0.3 g/dL (ref 0.0–0.4)
Beta Globulin: 1 g/dL (ref 0.7–1.3)
GLOBULIN, TOTAL: 3.7 g/dL (ref 2.2–3.9)
Gamma Globulin: 1.3 g/dL (ref 0.4–1.8)
M-SPIKE, %: 0.3 g/dL — AB
TOTAL PROTEIN ELP: 6.8 g/dL (ref 6.0–8.5)

## 2016-02-03 LAB — IMMUNOFIXATION ELECTROPHORESIS
IGA: 459 mg/dL — AB (ref 64–422)
IGG (IMMUNOGLOBIN G), SERUM: 938 mg/dL (ref 700–1600)
IgM, Serum: 445 mg/dL — ABNORMAL HIGH (ref 26–217)
TOTAL PROTEIN ELP: 7 g/dL (ref 6.0–8.5)

## 2016-02-03 LAB — PROTEIN ELECTRO, RANDOM URINE
ALPHA-2-GLOBULIN, U: 0 %
Albumin ELP, Urine: 100 %
Alpha-1-Globulin, U: 0 %
BETA GLOBULIN, U: 0 %
GAMMA GLOBULIN, U: 0 %
TOTAL PROTEIN, URINE-UPE24: 8.8 mg/dL

## 2016-02-03 LAB — KAPPA/LAMBDA LIGHT CHAINS
KAPPA FREE LGHT CHN: 49 mg/L — AB (ref 3.3–19.4)
KAPPA, LAMDA LIGHT CHAIN RATIO: 1.3 (ref 0.26–1.65)
LAMDA FREE LIGHT CHAINS: 37.6 mg/L — AB (ref 5.7–26.3)

## 2016-02-07 ENCOUNTER — Telehealth: Payer: Self-pay | Admitting: Family Medicine

## 2016-02-07 ENCOUNTER — Ambulatory Visit: Payer: Commercial Managed Care - HMO | Admitting: Family Medicine

## 2016-02-07 NOTE — Telephone Encounter (Signed)
Pt was scheduled today for arthritis flare up and she states she is in a lot of pain and wanted to know if something can be called into [facility].

## 2016-02-07 NOTE — Telephone Encounter (Signed)
Pt informed and understands °

## 2016-02-07 NOTE — Telephone Encounter (Signed)
Please encourage her to call her arthritis specialist at [facility]. Thank you

## 2016-02-09 ENCOUNTER — Inpatient Hospital Stay (HOSPITAL_BASED_OUTPATIENT_CLINIC_OR_DEPARTMENT_OTHER): Payer: Commercial Managed Care - HMO | Admitting: Oncology

## 2016-02-09 VITALS — BP 149/93 | HR 106 | Temp 95.2°F | Resp 18 | Wt 89.3 lb

## 2016-02-09 DIAGNOSIS — Z79899 Other long term (current) drug therapy: Secondary | ICD-10-CM | POA: Diagnosis not present

## 2016-02-09 DIAGNOSIS — E119 Type 2 diabetes mellitus without complications: Secondary | ICD-10-CM | POA: Diagnosis not present

## 2016-02-09 DIAGNOSIS — M069 Rheumatoid arthritis, unspecified: Secondary | ICD-10-CM | POA: Diagnosis not present

## 2016-02-09 DIAGNOSIS — J8410 Pulmonary fibrosis, unspecified: Secondary | ICD-10-CM | POA: Diagnosis not present

## 2016-02-09 DIAGNOSIS — R634 Abnormal weight loss: Secondary | ICD-10-CM

## 2016-02-09 DIAGNOSIS — D472 Monoclonal gammopathy: Secondary | ICD-10-CM | POA: Diagnosis not present

## 2016-02-09 DIAGNOSIS — Z9884 Bariatric surgery status: Secondary | ICD-10-CM | POA: Diagnosis not present

## 2016-02-09 DIAGNOSIS — E7800 Pure hypercholesterolemia, unspecified: Secondary | ICD-10-CM | POA: Diagnosis not present

## 2016-02-09 DIAGNOSIS — R778 Other specified abnormalities of plasma proteins: Secondary | ICD-10-CM

## 2016-02-09 DIAGNOSIS — R630 Anorexia: Secondary | ICD-10-CM

## 2016-02-09 NOTE — Progress Notes (Signed)
States had arthritis flare over the weekend and continues to have pain in left shoulder, arm, hand. Decreased appetite with weight loss. Recovering from shingles that started about 3 weeks ago, treated by PCP and finished medication prescribed by PCP. Will follow up with PCP next week and will see rheumatologist next month.

## 2016-02-13 ENCOUNTER — Other Ambulatory Visit: Payer: Self-pay | Admitting: Family Medicine

## 2016-02-13 ENCOUNTER — Encounter: Payer: Self-pay | Admitting: Family Medicine

## 2016-02-13 ENCOUNTER — Ambulatory Visit (INDEPENDENT_AMBULATORY_CARE_PROVIDER_SITE_OTHER): Payer: Commercial Managed Care - HMO | Admitting: Family Medicine

## 2016-02-13 VITALS — BP 116/68 | HR 101 | Temp 97.5°F | Resp 16 | Wt 90.0 lb

## 2016-02-13 DIAGNOSIS — E119 Type 2 diabetes mellitus without complications: Secondary | ICD-10-CM

## 2016-02-13 DIAGNOSIS — Z5181 Encounter for therapeutic drug level monitoring: Secondary | ICD-10-CM | POA: Diagnosis not present

## 2016-02-13 DIAGNOSIS — Z9884 Bariatric surgery status: Secondary | ICD-10-CM | POA: Diagnosis not present

## 2016-02-13 DIAGNOSIS — M069 Rheumatoid arthritis, unspecified: Secondary | ICD-10-CM

## 2016-02-13 DIAGNOSIS — R7989 Other specified abnormal findings of blood chemistry: Secondary | ICD-10-CM | POA: Diagnosis not present

## 2016-02-13 DIAGNOSIS — R5383 Other fatigue: Secondary | ICD-10-CM | POA: Diagnosis not present

## 2016-02-13 DIAGNOSIS — M810 Age-related osteoporosis without current pathological fracture: Secondary | ICD-10-CM

## 2016-02-13 DIAGNOSIS — E7800 Pure hypercholesterolemia, unspecified: Secondary | ICD-10-CM

## 2016-02-13 HISTORY — DX: Bariatric surgery status: Z98.84

## 2016-02-13 LAB — HEMOGLOBIN A1C
Hgb A1c MFr Bld: 7.2 % — ABNORMAL HIGH (ref <5.7)
Mean Plasma Glucose: 160 mg/dL

## 2016-02-13 LAB — VITAMIN B12: Vitamin B-12: >2000 pg/mL — ABNORMAL HIGH (ref 200–1100)

## 2016-02-13 MED ORDER — MIRTAZAPINE 15 MG PO TABS: 15.0000 mg | ORAL_TABLET | Freq: Every day | ORAL | Status: DC

## 2016-02-13 NOTE — Assessment & Plan Note (Signed)
Multifactorial; check labs; consider talking with pulm to see about walking test for daytime oxygen

## 2016-02-13 NOTE — Assessment & Plan Note (Signed)
Followed by Dr Kernodle.   

## 2016-02-13 NOTE — Progress Notes (Signed)
Patient ID: Anita Atkins, female   DOB: 09/20/1934, 81 y.o.   MRN: [id number]  Past Medical History  Diagnosis Date  . Diabetes mellitus without complication ([place])   . High cholesterol   . RA (rheumatoid arthritis) ([place])   . Pulmonary fibrosis ([place])   . Monoclonal paraproteinemia 01/31/2016    Past Surgical History  Procedure Laterality Date  . Gastric bypass  2007  . Cardiac surgery    . Exploration post operative open heart    . Cholecystectomy     Family History  Problem Relation Age of Onset  . Heart disease Mother   . Stroke Mother   . Diabetes Sister   . Hyperlipidemia Sister   . Cancer Maternal Aunt     female  . COPD Neg Hx   . Hypertension Neg Hx    Social History  Substance Use Topics  . Smoking status: Never Smoker   . Smokeless tobacco: Never Used  . Alcohol Use: No     Current outpatient prescriptions:  .  acetaminophen (TYLENOL) 500 MG tablet, Take 500 mg by mouth., Disp: , Rfl:  .  alendronate (FOSAMAX) 70 MG tablet, Take 1 tablet by mouth once a week., Disp: , Rfl:  .  azelastine (ASTELIN) 0.1 % nasal spray, , Disp: , Rfl:  .  benzonatate (TESSALON) 100 MG capsule, Take 1 capsule (100 mg total) by mouth every 8 (eight) hours as needed for cough., Disp: 90 capsule, Rfl: 3 .  hydroxychloroquine (PLAQUENIL) 200 MG tablet, Take 200 mg by mouth 2 (two) times daily., Disp: , Rfl:  .  mirtazapine (REMERON) 15 MG tablet, Take 1 tablet (15 mg total) by mouth at bedtime., Disp: 30 tablet, Rfl: 2 .  OFEV 150 MG CAPS, 2 (two) times daily. , Disp: , Rfl:  .  pantoprazole (PROTONIX) 20 MG tablet, Take 1 tablet (20 mg total) by mouth daily., Disp: 90 tablet, Rfl: 8 .  predniSONE (DELTASONE) 5 MG tablet, Take 5 mg by mouth daily with breakfast. Take 1 to 1.5 by mouth daily for joint pain, Disp: , Rfl:  .  simvastatin (ZOCOR) 40 MG tablet, Take 1 tablet (40 mg total) by mouth at bedtime., Disp: 90 tablet, Rfl: 3 .  SYMBICORT 160-4.5 MCG/ACT inhaler, , Disp: , Rfl:  .   traMADol (ULTRAM) 50 MG tablet, Take 50 mg by mouth as needed. , Disp: , Rfl:  .  VENTOLIN HFA 108 (90 Base) MCG/ACT inhaler, , Disp: , Rfl:   No Known Allergies

## 2016-02-13 NOTE — Assessment & Plan Note (Signed)
Check A1c and fasting glucose

## 2016-02-13 NOTE — Patient Instructions (Addendum)
Let's get blood drawn today Start back on the remeron Try to gain 2 pounds by next visit

## 2016-02-13 NOTE — Progress Notes (Signed)
BP 116/68   Pulse (!) 101   Temp 97.5 F (36.4 C) (Oral)   Resp 16   Wt 90 lb (40.8 kg)   SpO2 93%   BMI 16.73 kg/m    Subjective:    Patient ID: Anita Atkins, female    DOB: November 18, 1934, 81 y.o.   MRN: [id number]  HPI: Anita Atkins is a 81 y.o. female  Chief Complaint  Patient presents with  . Medication Refill   She has lost a little more weight; she talked to the cancer doctor; he recommended eating frequent small meals and boost or ensure;  Last TSH normal, 0.996; no fam hx of thyroid disorder No eating disorder Some feelings of jittery or shaky; just so weak; doctor said if she would eat more, she would not be as weak Taking supplements Not a good water drinker Hx of gastric bypass Reviewed last labs; glucose 172; not fasting Piece of raisin toast, applesauce or yogurt and oatmeal Diabetes; not on medicines High cholesterol on statin Last vit D 21.5 in October [facility]; pulmonary fibrosis; seeing Dr. Mortimer Fries  Depression screen [facility] 2/9 02/13/2016 06/15/2015  Decreased Interest 0 0  Down, Depressed, Hopeless 1 1  PHQ - 2 Score 1 1   Relevant past medical, surgical, family and social history reviewed Past Medical History:  Diagnosis Date  . Diabetes mellitus without complication ([place])   . High cholesterol   . Monoclonal paraproteinemia 01/31/2016  . Pulmonary fibrosis ([place])   . RA (rheumatoid arthritis) ([place])   . Shingles   . Status post bariatric surgery 02/13/2016   Past Surgical History:  Procedure Laterality Date  . CARDIAC SURGERY    . CHOLECYSTECTOMY    . EXPLORATION POST OPERATIVE OPEN HEART    . GASTRIC BYPASS  2007   Family History  Problem Relation Age of Onset  . Heart disease Mother   . Stroke Mother   . Diabetes Sister   . Hyperlipidemia Sister   . Cancer Maternal Aunt     female  . COPD Neg Hx   . Hypertension Neg Hx    Social History  Substance Use Topics  . Smoking status: Never Smoker  . Smokeless tobacco: Never Used  . Alcohol use No    Interim medical history since last visit reviewed. Allergies and medications reviewed  Review of Systems Per HPI unless specifically indicated above     Objective:    BP 116/68   Pulse (!) 101   Temp 97.5 F (36.4 C) (Oral)   Resp 16   Wt 90 lb (40.8 kg)   SpO2 93%   BMI 16.73 kg/m   Wt Readings from Last 3 Encounters:  04/12/16 89 lb (40.4 kg)  04/03/16 90 lb (40.8 kg)  04/03/16 90 lb 8 oz (41.1 kg)    Physical Exam  Constitutional: She appears well-developed.  thin  HENT:  Head: Normocephalic and atraumatic.  Cardiovascular: Normal rate and regular rhythm.   Pulmonary/Chest: Effort normal and breath sounds normal.  Musculoskeletal:       Right shoulder: She exhibits normal range of motion.       Cervical back: She exhibits decreased range of motion. She exhibits no tenderness, no bony tenderness and no swelling.  Neurological:  Patient unable to do Phalen's test, Tinel's negative; motor grip 5/5  Skin: Ecchymosis (few ecchymoses on sun exposed part of the right forearm) noted.  Psychiatric: Her mood appears not anxious. She does not exhibit a depressed mood.  Assessment & Plan:   Problem List Items Addressed This Visit      Endocrine   Type 2 diabetes mellitus without complication, without long-term current use of insulin (HCC) - Primary (Chronic)    Check A1c and fasting glucose      Relevant Orders   Hemoglobin A1C (Completed)   Lipid panel (Completed)     Musculoskeletal and Integument   Rheumatoid arthritis (HCC) (Chronic)    Followed by Dr. Jefm Bryant      Osteoporosis (Chronic)    manged by Dr. Jefm Bryant; check vit D      Relevant Orders   VITAMIN D 25 Hydroxy (Vit-D Deficiency, Fractures) (Completed)     Other   Status post bariatric surgery   Relevant Orders   Vitamin B12 (Completed)   Vitamin B1 (Completed)   High blood cholesterol (Chronic)    Check lipid; on statin      Relevant Orders   Lipid panel (Completed)   Fatigue     Multifactorial; check labs; consider talking with pulm to see about walking test for daytime oxygen      Relevant Orders   TSH   COMPLETE METABOLIC PANEL WITH GFR (Completed)    Other Visit Diagnoses    Medication monitoring encounter       Relevant Orders   COMPLETE METABOLIC PANEL WITH GFR (Completed)       Follow up plan: Return in about 4 weeks (around 03/12/2016).  An after-visit summary was printed and given to the patient at [place].  Please see the patient instructions which may contain other information and recommendations beyond what is mentioned above in the assessment and plan.  Meds ordered this encounter  Medications  . mirtazapine (REMERON) 15 MG tablet    Sig: Take 1 tablet (15 mg total) by mouth at bedtime.    Dispense:  90 tablet    Refill:  1    Orders Placed This Encounter  Procedures  . TSH  . Vitamin B12  . Vitamin B1  . COMPLETE METABOLIC PANEL WITH GFR  . Hemoglobin A1C  . Lipid panel  . VITAMIN D 25 Hydroxy (Vit-D Deficiency, Fractures)

## 2016-02-13 NOTE — Assessment & Plan Note (Signed)
Check lipid; on statin

## 2016-02-13 NOTE — Assessment & Plan Note (Signed)
manged by Dr. Jefm Bryant; check vit D

## 2016-02-14 LAB — COMPLETE METABOLIC PANEL WITH GFR
ALBUMIN: 3.6 g/dL (ref 3.6–5.1)
ALK PHOS: 80 U/L (ref 33–130)
ALT: 27 U/L (ref 6–29)
AST: 31 U/L (ref 10–35)
BILIRUBIN TOTAL: 0.3 mg/dL (ref 0.2–1.2)
BUN: 18 mg/dL (ref 7–25)
CALCIUM: 8.8 mg/dL (ref 8.6–10.4)
CHLORIDE: 99 mmol/L (ref 98–110)
CO2: 25 mmol/L (ref 20–31)
CREATININE: 0.72 mg/dL (ref 0.60–0.93)
GFR, Est Non African American: 80 mL/min (ref >=60)
Glucose, Bld: 200 mg/dL — ABNORMAL HIGH (ref 65–99)
Potassium: 4.7 mmol/L (ref 3.5–5.3)
Sodium: 137 mmol/L (ref 135–146)
Total Protein: 7.1 g/dL (ref 6.1–8.1)

## 2016-02-14 LAB — LIPID PANEL
CHOLESTEROL: 138 mg/dL (ref 125–200)
HDL: 67 mg/dL (ref >=46)
LDL Cholesterol: 45 mg/dL (ref <130)
TRIGLYCERIDES: 132 mg/dL (ref <150)
Total CHOL/HDL Ratio: 2.1 Ratio (ref <=5.0)
VLDL: 26 mg/dL (ref <30)

## 2016-02-17 LAB — VITAMIN B1: Vitamin B1 (Thiamine): 15 nmol/L (ref 8–30)

## 2016-02-17 LAB — VITAMIN D 25 HYDROXY (VIT D DEFICIENCY, FRACTURES): Vit D, 25-Hydroxy: 24 ng/mL — ABNORMAL LOW (ref 30–100)

## 2016-02-19 NOTE — Progress Notes (Signed)
[place]  [phone]) [phone] [phone]) [phone]  ID: Leitha Bleak OB: September 05, 1934  MR#: [id number]  [id number]  Patient Care Team: Arnetha Courser, MD as PCP - General (Family Medicine) Lloyd Huger, MD as Consulting Physician (Oncology) Flora Lipps, MD (Pulmonary Disease) Marlowe Sax, MD as Referring Physician (Internal Medicine) Julieanne Manson Leeanne Mannan., MD (Rheumatology) Josefine Class, MD as Referring Physician (Gastroenterology) [place] (Optometry) Minna Merritts, MD as Consulting Physician (Cardiology)  CHIEF COMPLAINT:  Chief Complaint  Patient presents with  . MGUS    INTERVAL HISTORY: Patient returns to clinic for repeat laboratory work and further evaluation. She is having significant pain secondary to her arthritis. She also has decreased appetite and weigh loss. She has no neurologic complaints. She denies any recent fevers or illnesses. She has no chest pain or shortness of breath. She denies any nausea, vomiting, constipation, or diarrhea. She has no urinary complaints. Patient offers no further specific complaints today.  REVIEW OF SYSTEMS:   Review of Systems  Constitutional: Positive for weight loss. Negative for fever and malaise/fatigue.  Respiratory: Negative for shortness of breath.   Cardiovascular: Negative.  Negative for chest pain.  Gastrointestinal: Positive for nausea. Negative for abdominal pain.  Genitourinary: Negative.   Musculoskeletal: Positive for myalgias, back pain, joint pain and neck pain.  Neurological: Negative.  Negative for weakness.  Psychiatric/Behavioral: Negative.     As per HPI. Otherwise, a complete review of systems is negatve.  PAST MEDICAL HISTORY: Past Medical History  Diagnosis Date  . Diabetes mellitus without complication ([place])   . High cholesterol   . RA (rheumatoid arthritis) ([place])   . Pulmonary fibrosis ([place])   . Monoclonal paraproteinemia 01/31/2016  .  Shingles   . Status post bariatric surgery 02/13/2016    PAST SURGICAL HISTORY: Past Surgical History  Procedure Laterality Date  . Gastric bypass  2007  . Cardiac surgery    . Exploration post operative open heart    . Cholecystectomy      FAMILY HISTORY Family History  Problem Relation Age of Onset  . Heart disease Mother   . Stroke Mother   . Diabetes Sister   . Hyperlipidemia Sister   . Cancer Maternal Aunt     female  . COPD Neg Hx   . Hypertension Neg Hx        ADVANCED DIRECTIVES:    HEALTH MAINTENANCE: Social History  Substance Use Topics  . Smoking status: Never Smoker   . Smokeless tobacco: Never Used  . Alcohol Use: No     Colonoscopy:  PAP:  Bone density:  Lipid panel:  Allergies  Allergen Reactions  . Plaquenil [Hydroxychloroquine Sulfate] Rash    Current Outpatient Prescriptions  Medication Sig Dispense Refill  . acetaminophen (TYLENOL) 500 MG tablet Take 500 mg by mouth.     Marland Kitchen alendronate (FOSAMAX) 70 MG tablet Take 1 tablet by mouth once a week.    Marland Kitchen azelastine (ASTELIN) 0.1 % nasal spray     . benzonatate (TESSALON) 100 MG capsule Take 1 capsule (100 mg total) by mouth every 8 (eight) hours as needed for cough. (Patient taking differently: Take 100 mg by mouth 2 (two) times daily. ) 90 capsule 3  . OFEV 150 MG CAPS 2 (two) times daily.     . pantoprazole (PROTONIX) 20 MG tablet Take 1 tablet (20 mg total) by mouth daily. 90 tablet 8  . predniSONE (DELTASONE) 5 MG tablet Take 5 mg by  mouth daily with breakfast. Take 1 to 1.5 by mouth daily for joint pain    . simvastatin (ZOCOR) 40 MG tablet Take 1 tablet (40 mg total) by mouth at bedtime. 90 tablet 3  . SYMBICORT 160-4.5 MCG/ACT inhaler     . traMADol (ULTRAM) 50 MG tablet Take 50 mg by mouth as needed.     . VENTOLIN HFA 108 (90 Base) MCG/ACT inhaler     . mirtazapine (REMERON) 15 MG tablet Take 1 tablet (15 mg total) by mouth at bedtime. 90 tablet 1   No current facility-administered  medications for this visit.    OBJECTIVE: Filed Vitals:   02/09/16 1032  BP: 149/93  Pulse: 106  Temp: 95.2 F (35.1 C)  Resp: 18     Body mass index is 16.6 kg/(m^2).    ECOG FS:0 - Asymptomatic  General: Well-developed, well-nourished, no acute distress. Eyes: Pink conjunctiva, anicteric sclera. Lungs: Clear to auscultation bilaterally. Heart: Regular rate and rhythm. No rubs, murmurs, or gallops. Abdomen: Soft, nontender, nondistended. No organomegaly noted, normoactive bowel sounds. Musculoskeletal: No edema, cyanosis, or clubbing. Neuro: Alert, answering all questions appropriately. Cranial nerves grossly intact. Skin: No rashes or petechiae noted. Psych: Normal affect.  LAB RESULTS:  Lab Results  Component Value Date   NA 137 02/13/2016   K 4.7 02/13/2016   CL 99 02/13/2016   CO2 25 02/13/2016   GLUCOSE 200* 02/13/2016   BUN 18 02/13/2016   CREATININE 0.72 02/13/2016   CALCIUM 8.8 02/13/2016   PROT 7.1 02/13/2016   ALBUMIN 3.6 02/13/2016   AST 31 02/13/2016   ALT 27 02/13/2016   ALKPHOS 80 02/13/2016   BILITOT 0.3 02/13/2016   GFRNONAA 80 02/13/2016   GFRAA >89 02/13/2016    Lab Results  Component Value Date   WBC 8.9 02/02/2016   NEUTROABS 3.7 06/15/2015   HGB 13.3 02/02/2016   HCT 37.8 02/02/2016   MCV 98.1 02/02/2016   PLT 354 02/02/2016   Lab Results  Component Value Date   TOTALPROTELP 6.8 02/02/2016   TOTALPROTELP 7.0 02/02/2016   ALBUMINELP 3.1 02/02/2016   A1GS 0.3 02/02/2016   A2GS 1.1* 02/02/2016   BETS 1.0 02/02/2016   GAMS 1.3 02/02/2016   MSPIKE 0.3* 02/02/2016   SPEI Comment 02/02/2016     STUDIES: No results found.  ASSESSMENT: MGUS.  PLAN:    1. MGUS: Patient's M spike is stable of 0.3. She does not have an M spike in her urine. The remainder of her laboratory work is either negative or within normal limits. She has no evidence of endorgan damage. No intervention is needed at this time. Patient does not require bone  marrow biopsy. She does not need a metastatic bone survey at this time, but will consider one in the future if there is suspicion of progression of disease. Return to clinic in 6 months with repeat laboratory work and further evaluation. 2. Pain: Patient states she has follow up with her Rheumatologist in the next several weeks. 3. Poor appetite and weight loss: Follow up with PCP in the next week.  Patient expressed understanding and was in agreement with this plan. She also understands that She can call clinic at any time with any questions, concerns, or complaints.   Lloyd Huger, MD   02/19/2016 9:49 AM

## 2016-02-20 LAB — TSH: TSH: 0.86 mIU/L

## 2016-02-24 ENCOUNTER — Ambulatory Visit: Payer: Commercial Managed Care - HMO | Admitting: Cardiovascular Disease

## 2016-03-06 ENCOUNTER — Emergency Department: Payer: Commercial Managed Care - HMO

## 2016-03-06 ENCOUNTER — Inpatient Hospital Stay
Admission: EM | Admit: 2016-03-06 | Discharge: 2016-03-08 | DRG: 871 | Disposition: A | Discharge status: Home / Self Care | Payer: Commercial Managed Care - HMO | Attending: Internal Medicine | Admitting: Internal Medicine

## 2016-03-06 ENCOUNTER — Encounter: Payer: Self-pay | Admitting: Emergency Medicine

## 2016-03-06 ENCOUNTER — Telehealth: Payer: Self-pay | Admitting: Family Medicine

## 2016-03-06 DIAGNOSIS — E785 Hyperlipidemia, unspecified: Secondary | ICD-10-CM | POA: Diagnosis present

## 2016-03-06 DIAGNOSIS — E7800 Pure hypercholesterolemia, unspecified: Secondary | ICD-10-CM | POA: Diagnosis present

## 2016-03-06 DIAGNOSIS — Z9884 Bariatric surgery status: Secondary | ICD-10-CM

## 2016-03-06 DIAGNOSIS — Z681 Body mass index (BMI) 19 or less, adult: Secondary | ICD-10-CM

## 2016-03-06 DIAGNOSIS — E119 Type 2 diabetes mellitus without complications: Secondary | ICD-10-CM | POA: Diagnosis not present

## 2016-03-06 DIAGNOSIS — J8410 Pulmonary fibrosis, unspecified: Secondary | ICD-10-CM | POA: Diagnosis present

## 2016-03-06 DIAGNOSIS — Z8249 Family history of ischemic heart disease and other diseases of the circulatory system: Secondary | ICD-10-CM | POA: Diagnosis not present

## 2016-03-06 DIAGNOSIS — M810 Age-related osteoporosis without current pathological fracture: Secondary | ICD-10-CM | POA: Diagnosis present

## 2016-03-06 DIAGNOSIS — Z888 Allergy status to other drugs, medicaments and biological substances status: Secondary | ICD-10-CM

## 2016-03-06 DIAGNOSIS — E43 Unspecified severe protein-calorie malnutrition: Secondary | ICD-10-CM | POA: Diagnosis not present

## 2016-03-06 DIAGNOSIS — R000 Tachycardia, unspecified: Secondary | ICD-10-CM | POA: Diagnosis not present

## 2016-03-06 DIAGNOSIS — Z7952 Long term (current) use of systemic steroids: Secondary | ICD-10-CM

## 2016-03-06 DIAGNOSIS — Z79899 Other long term (current) drug therapy: Secondary | ICD-10-CM

## 2016-03-06 DIAGNOSIS — I2510 Atherosclerotic heart disease of native coronary artery without angina pectoris: Secondary | ICD-10-CM | POA: Diagnosis present

## 2016-03-06 DIAGNOSIS — A419 Sepsis, unspecified organism: Principal | ICD-10-CM | POA: Diagnosis present

## 2016-03-06 DIAGNOSIS — J189 Pneumonia, unspecified organism: Secondary | ICD-10-CM | POA: Diagnosis not present

## 2016-03-06 DIAGNOSIS — E559 Vitamin D deficiency, unspecified: Secondary | ICD-10-CM | POA: Diagnosis present

## 2016-03-06 DIAGNOSIS — R079 Chest pain, unspecified: Secondary | ICD-10-CM | POA: Diagnosis not present

## 2016-03-06 DIAGNOSIS — M069 Rheumatoid arthritis, unspecified: Secondary | ICD-10-CM | POA: Diagnosis not present

## 2016-03-06 DIAGNOSIS — R51 Headache: Secondary | ICD-10-CM | POA: Diagnosis not present

## 2016-03-06 LAB — COMPREHENSIVE METABOLIC PANEL
ALBUMIN: 2.5 g/dL — AB (ref 3.5–5.0)
ALK PHOS: 121 U/L (ref 38–126)
ALT: 15 U/L (ref 14–54)
AST: 24 U/L (ref 15–41)
Anion gap: 13 (ref 5–15)
BUN: 17 mg/dL (ref 6–20)
CALCIUM: 8.2 mg/dL — AB (ref 8.9–10.3)
CHLORIDE: 95 mmol/L — AB (ref 101–111)
CO2: 22 mmol/L (ref 22–32)
CREATININE: 0.68 mg/dL (ref 0.44–1.00)
GFR calc Af Amer: >60 mL/min (ref >60)
GFR calc non Af Amer: >60 mL/min (ref >60)
GLUCOSE: 161 mg/dL — AB (ref 65–99)
Potassium: 4.1 mmol/L (ref 3.5–5.1)
SODIUM: 130 mmol/L — AB (ref 135–145)
Total Bilirubin: 1.2 mg/dL (ref 0.3–1.2)
Total Protein: 7.5 g/dL (ref 6.5–8.1)

## 2016-03-06 LAB — LACTIC ACID, PLASMA
LACTIC ACID, VENOUS: 1.5 mmol/L (ref 0.5–1.9)
Lactic Acid, Venous: 1.4 mmol/L (ref 0.5–1.9)

## 2016-03-06 LAB — CBC
HCT: 40 % (ref 35.0–47.0)
HEMOGLOBIN: 13.6 g/dL (ref 12.0–16.0)
MCH: 33.4 pg (ref 26.0–34.0)
MCHC: 34 g/dL (ref 32.0–36.0)
MCV: 98.3 fL (ref 80.0–100.0)
PLATELETS: 413 10*3/uL (ref 150–440)
RBC: 4.07 MIL/uL (ref 3.80–5.20)
RDW: 14.8 % — ABNORMAL HIGH (ref 11.5–14.5)
WBC: 34.4 10*3/uL — AB (ref 3.6–11.0)

## 2016-03-06 LAB — LIPASE, BLOOD: Lipase: 13 U/L (ref 11–51)

## 2016-03-06 MED ORDER — KETOROLAC TROMETHAMINE 30 MG/ML IJ SOLN
15.0000 mg | Freq: Once | INTRAMUSCULAR | Status: AC
  Administered 2016-03-06: 15 mg via INTRAVENOUS
  Filled 2016-03-06: qty 1

## 2016-03-06 MED ORDER — BOOST / RESOURCE BREEZE PO LIQD
1.0000 | Freq: Three times a day (TID) | ORAL | Status: DC
  Administered 2016-03-06 – 2016-03-08 (×5): 1 via ORAL

## 2016-03-06 MED ORDER — HEPARIN SODIUM (PORCINE) 5000 UNIT/ML IJ SOLN
5000.0000 [IU] | Freq: Three times a day (TID) | INTRAMUSCULAR | Status: DC
  Administered 2016-03-06 – 2016-03-08 (×6): 5000 [IU] via SUBCUTANEOUS
  Filled 2016-03-06 (×6): qty 1

## 2016-03-06 MED ORDER — IPRATROPIUM-ALBUTEROL 0.5-2.5 (3) MG/3ML IN SOLN
3.0000 mL | RESPIRATORY_TRACT | Status: DC
  Administered 2016-03-06 – 2016-03-07 (×9): 3 mL via RESPIRATORY_TRACT
  Filled 2016-03-06 (×8): qty 3

## 2016-03-06 MED ORDER — BENZONATATE 100 MG PO CAPS
100.0000 mg | ORAL_CAPSULE | Freq: Three times a day (TID) | ORAL | Status: DC | PRN
  Administered 2016-03-07: 21:00:00 100 mg via ORAL
  Filled 2016-03-06: qty 1

## 2016-03-06 MED ORDER — MIRTAZAPINE 15 MG PO TABS
15.0000 mg | ORAL_TABLET | Freq: Every day | ORAL | Status: DC
  Administered 2016-03-06 – 2016-03-07 (×2): 15 mg via ORAL
  Filled 2016-03-06 (×2): qty 1

## 2016-03-06 MED ORDER — MOMETASONE FURO-FORMOTEROL FUM 200-5 MCG/ACT IN AERO
2.0000 | INHALATION_SPRAY | Freq: Two times a day (BID) | RESPIRATORY_TRACT | Status: DC
  Administered 2016-03-06 – 2016-03-08 (×2): 2 via RESPIRATORY_TRACT
  Filled 2016-03-06: qty 8.8

## 2016-03-06 MED ORDER — SODIUM CHLORIDE 0.9 % IV BOLUS (SEPSIS)
500.0000 mL | Freq: Once | INTRAVENOUS | Status: AC
  Administered 2016-03-06: 11:00:00 500 mL via INTRAVENOUS

## 2016-03-06 MED ORDER — DEXTROSE 5 % IV SOLN
1.0000 g | INTRAVENOUS | Status: DC
  Administered 2016-03-07 – 2016-03-08 (×2): 1 g via INTRAVENOUS
  Filled 2016-03-06 (×2): qty 10

## 2016-03-06 MED ORDER — IPRATROPIUM-ALBUTEROL 0.5-2.5 (3) MG/3ML IN SOLN
RESPIRATORY_TRACT | Status: AC
  Filled 2016-03-06: qty 3

## 2016-03-06 MED ORDER — SODIUM CHLORIDE 0.9 % IV SOLN
1000.0000 mL | Freq: Once | INTRAVENOUS | Status: AC
  Administered 2016-03-06: 1000 mL via INTRAVENOUS

## 2016-03-06 MED ORDER — TRAMADOL HCL 50 MG PO TABS
50.0000 mg | ORAL_TABLET | Freq: Four times a day (QID) | ORAL | Status: DC | PRN
  Administered 2016-03-06 – 2016-03-08 (×2): 50 mg via ORAL
  Filled 2016-03-06 (×2): qty 1

## 2016-03-06 MED ORDER — ACETAMINOPHEN 500 MG PO TABS
1000.0000 mg | ORAL_TABLET | Freq: Once | ORAL | Status: AC
  Administered 2016-03-06: 1000 mg via ORAL
  Filled 2016-03-06: qty 2

## 2016-03-06 MED ORDER — SIMVASTATIN 40 MG PO TABS
40.0000 mg | ORAL_TABLET | Freq: Every day | ORAL | Status: DC
  Administered 2016-03-06 – 2016-03-07 (×2): 40 mg via ORAL
  Filled 2016-03-06 (×2): qty 1

## 2016-03-06 MED ORDER — SODIUM CHLORIDE 0.9 % IV BOLUS (SEPSIS)
1000.0000 mL | Freq: Once | INTRAVENOUS | Status: AC
  Administered 2016-03-06: 1000 mL via INTRAVENOUS

## 2016-03-06 MED ORDER — ALENDRONATE SODIUM 70 MG PO TABS: 70.0000 mg | ORAL_TABLET | ORAL | Status: DC

## 2016-03-06 MED ORDER — ACETAMINOPHEN 500 MG PO TABS: 500.0000 mg | ORAL_TABLET | Freq: Four times a day (QID) | ORAL | Status: DC | PRN

## 2016-03-06 MED ORDER — PREDNISONE 5 MG PO TABS
5.0000 mg | ORAL_TABLET | Freq: Every day | ORAL | Status: DC
  Administered 2016-03-06 – 2016-03-08 (×3): 5 mg via ORAL
  Filled 2016-03-06 (×3): qty 1

## 2016-03-06 MED ORDER — NINTEDANIB ESYLATE 150 MG PO CAPS
ORAL_CAPSULE | Freq: Two times a day (BID) | ORAL | Status: DC
  Administered 2016-03-06 – 2016-03-08 (×4): via ORAL
  Filled 2016-03-06 (×2): qty 1

## 2016-03-06 MED ORDER — DEXTROSE 5 % IV SOLN
250.0000 mg | INTRAVENOUS | Status: DC
  Administered 2016-03-07: 250 mg via INTRAVENOUS
  Filled 2016-03-06 (×2): qty 250

## 2016-03-06 MED ORDER — DEXTROSE 5 % IV SOLN
1.0000 g | Freq: Once | INTRAVENOUS | Status: AC
  Administered 2016-03-06: 1 g via INTRAVENOUS
  Filled 2016-03-06: qty 10

## 2016-03-06 MED ORDER — PANTOPRAZOLE SODIUM 40 MG PO TBEC
40.0000 mg | DELAYED_RELEASE_TABLET | Freq: Every day | ORAL | Status: DC
  Administered 2016-03-06 – 2016-03-08 (×3): 40 mg via ORAL
  Filled 2016-03-06 (×3): qty 1

## 2016-03-06 MED ORDER — DEXTROSE 5 % IV SOLN
500.0000 mg | Freq: Once | INTRAVENOUS | Status: AC
  Administered 2016-03-06: 500 mg via INTRAVENOUS
  Filled 2016-03-06: qty 500

## 2016-03-06 NOTE — Telephone Encounter (Signed)
PER KATHY AT [facility] IS NEEDING A HUMAN REFERRAL. APPT IS March 14 2016.

## 2016-03-06 NOTE — ED Notes (Signed)
Pt reports headache, nausea, sweats, rt sided rib cage pain all since beginning of this month.

## 2016-03-06 NOTE — ED Notes (Signed)
Pt presents with headache, vomiting and left upper quad pain since the first of July.
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
## 2016-03-06 NOTE — Progress Notes (Signed)
Initial Nutrition Assessment  DOCUMENTATION CODES:   Severe malnutrition in context of chronic illness, Underweight  INTERVENTION:   Cater to pt preferences on Heart healthy/Carb Modified Diet Order Recommend Boost Breeze po TID, each supplement provides 250 kcal and 9 grams of protein as pt does not like milky Ensures, Boost.   NUTRITION DIAGNOSIS:   Malnutrition related to chronic illness as evidenced by mild depletion of body fat, severe depletion of muscle mass, energy intake < or equal to 75% for > or equal to 1 month.  GOAL:   Patient will meet greater than or equal to 90% of their needs  MONITOR:   PO intake, Supplement acceptance, Labs, Weight trends, I & O's  REASON FOR ASSESSMENT:   Malnutrition Screening Tool    ASSESSMENT:   Pt admitted with headache and 10 days PTA with n/v (which has resolved per MD note) with pna.  Past Medical History  Diagnosis Date  . Diabetes mellitus without complication ([place])   . High cholesterol   . RA (rheumatoid arthritis) ([place])   . Pulmonary fibrosis ([place])   . Monoclonal paraproteinemia 01/31/2016  . Shingles   . Status post bariatric surgery 02/13/2016   Past Surgical History  Procedure Laterality Date  . Gastric bypass  2007  . Cardiac surgery    . Exploration post operative open heart    . Cholecystectomy      Diet Order:  Diet heart healthy/carb modified Room service appropriate?: Yes; Fluid consistency:: Thin   Pt reports eating 50% of fruit plate at lunch today.   Pt reports appetite has been usual but she is eating half of her meals for a long time now. AFter pt ate 50% of fruit plate pt reports that is very typical of her intake. Pt reports not drinking any supplements PTA as she does not like them. Pt reports recently her blood sugars have been high and therefore she was told outpatient not to eat anything with carbs, bread, etc as well. RD also notes pt with n/v 10 days PTA.   Medications: Remeron,  Prednisone Labs: Na 130, Glucose 161   Gastrointestinal Profile: Last BM:   no BM documented  Nutrition-Focused Physical Exam Findings: Nutrition-Focused physical exam completed. Findings are mild-moderate fat depletion, moderate-severe muscle depletion, and no edema.     Weight Change: Pt reports weight loss for years. (RD does note gastric bypass surgery in 2007). Pt currently weighs 90lbs and thinks it has been stable recently. Per [facility] weight of 98lbs December 2016 (8% weight loss in 8 months)   Skin:   not assessed yet    Height:   Ht Readings from Last 1 Encounters:  03/06/16 5' 1.5" (1.562 m)    Weight:   Wt Readings from Last 1 Encounters:  03/06/16 90 lb (40.824 kg)   Wt Readings from Last 10 Encounters:  03/06/16 90 lb (40.824 kg)  02/13/16 90 lb (40.824 kg)  02/09/16 89 lb 4.6 oz (40.5 kg)  12/07/15 95 lb (43.092 kg)  08/17/15 98 lb 8 oz (44.679 kg)  08/10/15 97 lb (43.999 kg)  08/04/15 97 lb 10.6 oz (44.3 kg)  07/13/15 98 lb (44.453 kg)  07/05/15 98 lb 9.6 oz (44.725 kg)  06/15/15 96 lb (43.545 kg)    Ideal Body Weight:   48kg  BMI:  Body mass index is 16.73 kg/(m^2).  Estimated Nutritional Needs:   Kcal:  1440-1680kcals  Protein:  53-62g protein  Fluid:  >/= 1.5L fluid  EDUCATION NEEDS:  Education needs no appropriate at this time  Dwyane Luo, RD, LDN Pager [phone] Weekend/On-Call Pager [phone]

## 2016-03-06 NOTE — Telephone Encounter (Signed)
Done

## 2016-03-06 NOTE — ED Provider Notes (Signed)
[facility] Emergency Department Provider Note   ____________________________________________    I have reviewed the triage vital signs and the nursing notes.   HISTORY  Chief Complaint Weakness, rib pain, headache    HPI Anita Atkins is a 81 y.o. female who presents with multiple complaints. She reports over the last 10 days she has felt ill, it started with nausea and vomiting which resolved. She then apparently developed severe night sweats. Recently she has felt weak and exhausted complains of pain in her right rib cage/chest. Family reports she is not eating. She denies abdominal pain. She no longer has vomiting or nausea. She also complains of a headache which she has had for 5 days which is global and throbbing. She denies neck pain. She does not believe she has had fevers at home.   Past Medical History  Diagnosis Date  . Diabetes mellitus without complication ([facility])   . High cholesterol   . RA (rheumatoid arthritis) ([place])   . Pulmonary fibrosis ([place])   . Monoclonal paraproteinemia 01/31/2016  . Shingles   . Status post bariatric surgery 02/13/2016    Patient Active Problem List   Diagnosis Date Noted  . Status post bariatric surgery 02/13/2016  . Monoclonal paraproteinemia 01/31/2016  . Elevated sedimentation rate 08/15/2015  . History of ITP 07/18/2015  . Abnormal SPEP 07/18/2015  . Diabetic neuropathy ([place]) 07/13/2015  . Macrocytosis 07/13/2015  . Depression, major, single episode, mild ([place]) 07/13/2015  . Chronic diarrhea 07/13/2015  . Cataracts, bilateral 06/20/2015  . Hyperkalemia 06/17/2015  . Cough, persistent 06/15/2015  . Rheumatoid arthritis ([place]) 06/15/2015  . Osteoporosis 06/15/2015  . Pulmonary fibrosis ([place]) 06/15/2015  . Type 2 diabetes mellitus without complication, without long-term current use of insulin ([place]) 06/15/2015  . Coronary artery disease involving native coronary artery of native heart without angina  pectoris 06/15/2015  . Vitamin D deficiency 06/15/2015  . High blood cholesterol 06/15/2015  . Fatigue 06/15/2015  . Bariatric surgery status 06/15/2015  . Needs flu shot 06/15/2015  . Need for prophylactic vaccination with Streptococcus pneumoniae (Pneumococcus) and Influenza vaccines 06/15/2015    Past Surgical History  Procedure Laterality Date  . Gastric bypass  2007  . Cardiac surgery    . Exploration post operative open heart    . Cholecystectomy      Current Outpatient Rx  Name  Route  Sig  Dispense  Refill  . acetaminophen (TYLENOL) 500 MG tablet   Oral   Take 500 mg by mouth.          Marland Kitchen alendronate (FOSAMAX) 70 MG tablet   Oral   Take 1 tablet by mouth once a week.         Marland Kitchen azelastine (ASTELIN) 0.1 % nasal spray               . benzonatate (TESSALON) 100 MG capsule   Oral   Take 1 capsule (100 mg total) by mouth every 8 (eight) hours as needed for cough. Patient taking differently: Take 100 mg by mouth 2 (two) times daily.    90 capsule   3   . mirtazapine (REMERON) 15 MG tablet   Oral   Take 1 tablet (15 mg total) by mouth at bedtime.   90 tablet   1   . OFEV 150 MG CAPS      2 (two) times daily.            Dispense as written.   . pantoprazole (  PROTONIX) 20 MG tablet   Oral   Take 1 tablet (20 mg total) by mouth daily.   90 tablet   8   . predniSONE (DELTASONE) 5 MG tablet   Oral   Take 5 mg by mouth daily with breakfast. Take 1 to 1.5 by mouth daily for joint pain         . simvastatin (ZOCOR) 40 MG tablet   Oral   Take 1 tablet (40 mg total) by mouth at bedtime.   90 tablet   3   . SYMBICORT 160-4.5 MCG/ACT inhaler                 Dispense as written.   . traMADol (ULTRAM) 50 MG tablet   Oral   Take 50 mg by mouth as needed.          Enid Cutter HFA 108 (90 Base) MCG/ACT inhaler                 Dispense as written.     Allergies Plaquenil  Family History  Problem Relation Age of Onset  . Heart  disease Mother   . Stroke Mother   . Diabetes Sister   . Hyperlipidemia Sister   . Cancer Maternal Aunt     female  . COPD Neg Hx   . Hypertension Neg Hx     Social History Social History  Substance Use Topics  . Smoking status: Never Smoker   . Smokeless tobacco: Never Used  . Alcohol Use: No    Review of Systems  Constitutional: Night sweats as above Eyes: No visual changes.  ENT: No sore throat. Cardiovascular: Right-sided chest discomfort as above Respiratory: Mild shortness of breath Gastrointestinal: No abdominal pain.   Genitourinary: Negative for dysuria. Positive frequency Musculoskeletal: Negative for back pain. Skin: Negative for rash. Neurological: Headache as above, no focal deficits  10-point ROS otherwise negative.  ____________________________________________   PHYSICAL EXAM:  VITAL SIGNS: ED Triage Vitals  Enc Vitals Group     BP 03/06/16 0730 117/58 mmHg     Pulse Rate 03/06/16 0730 120     Resp 03/06/16 0730 20     Temp 03/06/16 0730 97.6 F (36.4 C)     Temp Source 03/06/16 0730 Oral     SpO2 03/06/16 0730 95 %     Weight --      Height --      Head Cir --      Peak Flow --      Pain Score 03/06/16 0730 8     Pain Loc --      Pain Edu? --      Excl. in [place]? --     Constitutional: Alert and oriented. No acute distress.  Eyes: Conjunctivae are normal.  Head: Atraumatic.Normocephalic Nose: No congestion/rhinnorhea. Mouth/Throat: Mucous membranes are moist.  Oropharynx non-erythematous. Neck: No stridor. Painless ROM Cardiovascular: Tachycardia, regular rhythm. Grossly normal heart sounds.  Good peripheral circulation. Respiratory: Increased respiratory effort, tachypnea.  No retractions. Lungs CTAB. Gastrointestinal: Soft and nontender. No distention.  No CVA tenderness. Genitourinary: deferred Musculoskeletal: No lower extremity tenderness nor edema.  Warm and well perfused Neurologic:  Normal speech and language. No gross focal  neurologic deficits are appreciated.  Skin:  Skin is warm, dry and intact. No rash noted. Psychiatric: Mood and affect are normal. Speech and behavior are normal.  ____________________________________________   LABS (all labs ordered are listed, but only abnormal results are displayed)  Labs Reviewed  CBC  COMPREHENSIVE METABOLIC PANEL  LIPASE, BLOOD  URINALYSIS COMPLETEWITH MICROSCOPIC ([facility] ONLY)  LACTIC ACID, PLASMA  LACTIC ACID, PLASMA   ____________________________________________  EKG  ED ECG REPORT I, Lavonia Drafts, the attending physician, personally viewed and interpreted this ECG.  Date: 03/06/2016 EKG Time: 7:37 AM Rate: 118 Rhythm: Sinus tachycardia QRS Axis: normal Intervals: normal ST/T Wave abnormalities: Nonspecific changes Conduction Disturbances: none   ____________________________________________  RADIOLOGY  CT scan unremarkable Chest x-ray consistent with right lower lobe pneumonia ____________________________________________   PROCEDURES  Procedure(s) performed: No    Critical Care performed: No ____________________________________________   INITIAL IMPRESSION / ASSESSMENT AND PLAN / ED COURSE  Pertinent labs & imaging results that were available during my care of the patient were reviewed by me and considered in my medical decision making (see chart for details).  Patient presents with weakness, night sweats, tachypnea, tachycardia and right chest discomfort. This is very suspicious for pneumonia, however she also complains of headache for 5 days. We will draw labs including a lactate, urinalysis, performed chest x-ray and CT scan. We will by mouth Tylenol to the patient for her headache initially.  Patient's chest x-ray is consistent with right lower lobe pneumonia. I ordered Rocephin and azithromycin. We will give very small dose of Toradol for her headache, CT scan is negative.  I will discuss with the hospitalist for  admission ____________________________________________   FINAL CLINICAL IMPRESSION(S) / ED DIAGNOSES  Final diagnoses:  Community acquired pneumonia      NEW MEDICATIONS STARTED DURING THIS VISIT:  New Prescriptions   No medications on file     Note:  This document was prepared using Dragon voice recognition software and may include unintentional dictation errors.    Lavonia Drafts, MD 03/06/16 [phone]

## 2016-03-07 LAB — URINALYSIS COMPLETE WITH MICROSCOPIC (ARMC ONLY)
BILIRUBIN URINE: NEGATIVE
Bacteria, UA: NONE SEEN
GLUCOSE, UA: 150 mg/dL — AB
HGB URINE DIPSTICK: NEGATIVE
LEUKOCYTES UA: NEGATIVE
NITRITE: NEGATIVE
Protein, ur: NEGATIVE mg/dL
RBC / HPF: NONE SEEN RBC/hpf (ref 0–5)
SPECIFIC GRAVITY, URINE: 1.014 (ref 1.005–1.030)
pH: 6 (ref 5.0–8.0)

## 2016-03-07 LAB — BASIC METABOLIC PANEL
ANION GAP: 5 (ref 5–15)
BUN: 14 mg/dL (ref 6–20)
CALCIUM: 7.6 mg/dL — AB (ref 8.9–10.3)
CO2: 25 mmol/L (ref 22–32)
Chloride: 104 mmol/L (ref 101–111)
Creatinine, Ser: 0.48 mg/dL (ref 0.44–1.00)
Glucose, Bld: 261 mg/dL — ABNORMAL HIGH (ref 65–99)
Potassium: 3.5 mmol/L (ref 3.5–5.1)
SODIUM: 134 mmol/L — AB (ref 135–145)

## 2016-03-07 LAB — URINE CULTURE: Culture: NO GROWTH

## 2016-03-07 LAB — CBC
HCT: 32.6 % — ABNORMAL LOW (ref 35.0–47.0)
HEMOGLOBIN: 11.1 g/dL — AB (ref 12.0–16.0)
MCH: 33.6 pg (ref 26.0–34.0)
MCHC: 34.1 g/dL (ref 32.0–36.0)
MCV: 98.4 fL (ref 80.0–100.0)
PLATELETS: 352 10*3/uL (ref 150–440)
RBC: 3.31 MIL/uL — AB (ref 3.80–5.20)
RDW: 14.7 % — ABNORMAL HIGH (ref 11.5–14.5)
WBC: 19.5 10*3/uL — AB (ref 3.6–11.0)

## 2016-03-07 LAB — GLUCOSE, CAPILLARY
GLUCOSE-CAPILLARY: 305 mg/dL — AB (ref 65–99)
GLUCOSE-CAPILLARY: 243 mg/dL — AB (ref 65–99)
GLUCOSE-CAPILLARY: 144 mg/dL — AB (ref 65–99)

## 2016-03-07 MED ORDER — GLIPIZIDE ER 2.5 MG PO TB24
2.5000 mg | ORAL_TABLET | Freq: Every day | ORAL | Status: DC
  Administered 2016-03-07 – 2016-03-08 (×2): 2.5 mg via ORAL
  Filled 2016-03-07 (×2): qty 1

## 2016-03-07 MED ORDER — INSULIN ASPART 100 UNIT/ML ~~LOC~~ SOLN
0.0000 [IU] | Freq: Three times a day (TID) | SUBCUTANEOUS | Status: DC
  Administered 2016-03-07: 3 [IU] via SUBCUTANEOUS
  Administered 2016-03-07: 12:00:00 7 [IU] via SUBCUTANEOUS
  Administered 2016-03-08: 1 [IU] via SUBCUTANEOUS
  Filled 2016-03-07: qty 3
  Filled 2016-03-07: qty 7
  Filled 2016-03-07: qty 1

## 2016-03-07 MED ORDER — IPRATROPIUM-ALBUTEROL 0.5-2.5 (3) MG/3ML IN SOLN
3.0000 mL | Freq: Four times a day (QID) | RESPIRATORY_TRACT | Status: DC
  Administered 2016-03-08: 07:00:00 3 mL via RESPIRATORY_TRACT
  Filled 2016-03-07: qty 3

## 2016-03-07 MED ORDER — IPRATROPIUM-ALBUTEROL 0.5-2.5 (3) MG/3ML IN SOLN
3.0000 mL | RESPIRATORY_TRACT | Status: DC | PRN
Start: 2016-03-07 — End: 2016-03-08
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
## 2016-03-07 NOTE — Plan of Care (Signed)
Problem: Pain Managment: Goal: General experience of comfort will improve Outcome: Progressing Pt required no pain medication this shift

## 2016-03-07 NOTE — Care Management (Signed)
Admitted to [facility] with the diagnosis of pneumonia. Lives alone. Daughter is Dorian Pod [phone]). Moved to this area in September 2016 from [place], [place]. Next appointment with Dr. Sanda Klein is 03/12/16. Home Health in [place] in the past. No skilled facility. Uses no aids for ambulation. No Life Alert. Takes care of all basic and instrumental activities of daily living herself, drives. No falls. [place] appetite. Bi-Pass surgery 2007. Prescriptions are filled at [facility] on [address]. Granddaughter, Edmonia Lynch, will transport, if discharged Thursday.  Physical therapy evaluation completed. Recommends home with home health and physical therapy. Discussed available home health agencies. [place] RN MSN CCM Care Management [phone]

## 2016-03-07 NOTE — Evaluation (Signed)
Physical Therapy Evaluation Patient Details Name: Anita Atkins MRN: [id number] DOB: Dec 31, 1934 Today's Date: 03/07/2016   History of Present Illness  Pt is a 81 yr old female with a known history of DM, high cholesterol, pulmonary fibrosis, shingles, s/p bariatric surgery (6/17) with malnutrition. She started having cough and chest pain for last 7-8 days which is gradually getting worse associated with shortness of breath. She decided to come to emergency room and was found to have right lower lobe pneumonia with tachycardia and tachypnea and elevated white blood cell count.    Clinical Impression  Prior to admission, pt was independent with household mobility and basic ADLs, requiring seated rest between bouts of mobility (home is set up for this). Pt lives alone in a one-story home with 2+1 STE, though has supportive family close by who check in on her. Currently, pt is mod I with bed mobility and CGA with sit-to-stand transfers and ambulation x 36ft. Pt is able to ambulate without AD, but demonstrates lateral instability which improves with use of RW.  Distance limited by fatigue/SOB (SaO2 >94%). Pt would benefit from skilled PT to address noted impairments and functional limitations.  Recommend pt discharge home with HHPT when medically appropriate.     Follow Up Recommendations Home health PT    Equipment Recommendations   (pt has equipment at home)    Recommendations for Other Services       Precautions / Restrictions Precautions Precautions: Fall Restrictions Weight Bearing Restrictions: No      Mobility  Bed Mobility Overal bed mobility: Modified Independent General bed mobility comments: HOB elevated and use of bed rails; no physical assist required  Transfers Overall transfer level: Needs assistance Equipment used: None Transfers: Sit to/from Stand (x2 trials) Sit to Stand: Min guard General transfer comment: Vc's for foot placement; min guard for safety, no physical  assist required; pt with increased SOB following transfer (SaO2 94%)  Ambulation/Gait Ambulation/Gait assistance: Min guard Ambulation Distance: 20 Feet (25ft without AD, 80ft with RW) Assistive device: Rolling walker (2 wheeled) Gait Pattern/deviations: Step-through pattern;Decreased step length - right;Decreased step length - left;Shuffle;Narrow base of support Gait velocity: decreased General Gait Details: Pt initially ambulating without AD, but demonstrates mild lateral instability (no overt LOB). Trialed RW and pt with increased stability and confidence. Distance limited by SOB, though O2 sats maintained >94%.  Stairs    Wheelchair Mobility    Modified Rankin (Stroke Patients Only)       Balance Overall balance assessment: Needs assistance Sitting-balance support: Feet supported Sitting balance-Leahy Scale: Good   Standing balance support: No upper extremity supported Standing balance-Leahy Scale: Fair Standing balance comment: Pt able to ambulate ~55ft without use of AD, but demonstrates mild instability laterally. Increased stability with bilat UE support on RW.      Pertinent Vitals/Pain Pain Assessment: No/denies pain  HR and O2 monitored throughout session and maintained WFL. BP at initiation of session: 125/53 mmHg    Home Living Family/patient expects to be discharged to:: Private residence Living Arrangements: Alone Available Help at Discharge: Family;Friend(s) Type of Home: House Home Access: Stairs to enter Entrance Stairs-Rails: None Entrance Stairs-Number of Steps: 2+1 Home Layout: One level Home Equipment: Environmental consultant - 2 wheels;Cane - quad;Cane - single point;Bedside commode Additional Comments: pt and family considering installing grab bars    Prior Function Level of Independence: Independent  Comments: Household ambulator; limited by fatigue. Home is set-up so that she can take seated rest breaks between mobility bouts.  Extremity/Trunk  Assessment   Upper Extremity Assessment: Overall WFL for tasks assessed   Lower Extremity Assessment: Overall WFL for tasks assessed Visible atrophy throughout bilateral LEs, though able to move against gravity demonstrating strength at least 3/5 Light touch sensation intact bilat  Cervical / Trunk Assessment: Normal    Communication   Communication: No difficulties  Cognition Arousal/Alertness: Awake/alert Behavior During Therapy: WFL for tasks assessed/performed Overall Cognitive Status: Within Functional Limits for tasks assessed    General Comments Nursing cleared pt for participation in physical therapy.  Pt agreeable to PT session.     Exercises General Exercises - Lower Extremity Ankle Circles/Pumps: AROM Quad Sets: AROM Short Arc Quad: AROM Heel Slides: AROM Hip ABduction/ADduction: AROM Straight Leg Raises: AROM  All exercises performed bilaterally x 10 reps in supine.      Assessment/Plan    PT Assessment Patient needs continued PT services  PT Diagnosis Difficulty walking   PT Problem List Decreased activity tolerance;Decreased balance;Decreased mobility;Decreased knowledge of use of DME;Decreased strength  PT Treatment Interventions     PT Goals (Current goals can be found in the Care Plan section) Acute Rehab PT Goals Patient Stated Goal: To go home PT Goal Formulation: With patient Time For Goal Achievement: 03/21/16 Potential to Achieve Goals: Good    Frequency Min 2X/week   Barriers to discharge           End of Session Equipment Utilized During Treatment: Gait belt Activity Tolerance: Patient limited by fatigue Patient left: in chair;with call bell/phone within reach;with chair alarm set;with family/visitor present Nurse Communication: Mobility status         Time: 1138-1207 PT Time Calculation (min) (ACUTE ONLY): 29 min   Charges:         PT G Codes:        [place], SPT 03/07/2016, 12:56 PM

## 2016-03-07 NOTE — Progress Notes (Signed)
Inpatient Diabetes Program Recommendations  AACE/ADA: New Consensus Statement on Inpatient Glycemic Control (2015)  Target Ranges:  Prepandial:   less than 140 mg/dL      Peak postprandial:   less than 180 mg/dL (1-2 hours)      Critically ill patients:  140 - 180 mg/dL  Results for Anita Atkins, Anita Atkins (MRN [id number]) as of 03/07/2016 10:33  Ref. Range 03/06/2016 07:58 03/07/2016 05:29  Glucose Latest Ref Range: 65-99 mg/dL 161 (H) 261 (H)   Results for [name], [name] (MRN [id number]) as of 03/07/2016 10:33  Ref. Range 02/13/2016 14:47  Hemoglobin A1C Latest Ref Range: <5.7 % 7.2 (H)   Review of Glycemic Control  Diabetes history: DM2 Outpatient Diabetes medications: Glipizide 2.5 mg daily Current orders for Inpatient glycemic control: None  Inpatient Diabetes Program Recommendations: Correction (SSI): While inpatient, please consider ordering CBGs wtih Novolog correction scale ACHS. HgbA1C: A1C 7.2% on 02/13/16.  Thanks, Barnie Alderman, RN, MSN, CDE Diabetes Coordinator Inpatient Diabetes Program [phone] (Team Pager from [place] to [place]) [phone] ([place] office) [phone] [facility] office) [phone] [facility] office)

## 2016-03-08 LAB — CBC
HCT: 31.6 % — ABNORMAL LOW (ref 35.0–47.0)
Hemoglobin: 11.3 g/dL — ABNORMAL LOW (ref 12.0–16.0)
MCH: 34.5 pg — ABNORMAL HIGH (ref 26.0–34.0)
MCHC: 35.7 g/dL (ref 32.0–36.0)
MCV: 96.5 fL (ref 80.0–100.0)
PLATELETS: 428 10*3/uL (ref 150–440)
RBC: 3.27 MIL/uL — AB (ref 3.80–5.20)
RDW: 14.9 % — AB (ref 11.5–14.5)
WBC: 9.3 10*3/uL (ref 3.6–11.0)

## 2016-03-08 LAB — GLUCOSE, CAPILLARY: Glucose-Capillary: 143 mg/dL — ABNORMAL HIGH (ref 65–99)

## 2016-03-08 MED ORDER — BOOST / RESOURCE BREEZE PO LIQD: 1.0000 | Freq: Three times a day (TID) | ORAL | Status: AC

## 2016-03-08 MED ORDER — CEFUROXIME AXETIL 250 MG PO TABS: 250.0000 mg | ORAL_TABLET | Freq: Two times a day (BID) | ORAL | Status: DC

## 2016-03-08 MED ORDER — TRAMADOL HCL 50 MG PO TABS: 50.0000 mg | ORAL_TABLET | Freq: Two times a day (BID) | ORAL | Status: DC | PRN

## 2016-03-08 MED ORDER — AZITHROMYCIN 250 MG PO TABS
250.0000 mg | ORAL_TABLET | Freq: Every day | ORAL | Status: DC
  Administered 2016-03-08: 250 mg via ORAL
  Filled 2016-03-08: qty 1

## 2016-03-08 MED ORDER — AZITHROMYCIN 250 MG PO TABS: 250.0000 mg | ORAL_TABLET | Freq: Every day | ORAL | Status: DC

## 2016-03-08 NOTE — Progress Notes (Signed)
Pt being discharged home with home health at this time, discharge instructions and prescriptions reviewed with pt and family, states understanding, pts home meds returned, pt with no complaints at discharge, no distress or discomfort noted

## 2016-03-08 NOTE — Care Management Important Message (Signed)
Important Message  Patient Details  Name: Anita Atkins MRN: [id number] Date of Birth: 03-01-1935   Medicare Important Message Given:  Yes    [name] 03/08/2016, 10:05 AM

## 2016-03-08 NOTE — Discharge Summary (Signed)
[facility] [place] NAME: Anita Atkins    MR#:  [id number]  DATE OF BIRTH:  1935/05/24  DATE OF ADMISSION:  03/06/2016 ADMITTING PHYSICIAN: Vaughan Basta, MD  DATE OF DISCHARGE:03/08/2016  PRIMARY CARE PHYSICIAN: Enid Derry, MD    ADMISSION DIAGNOSIS:  Community acquired pneumonia [J18.9]  DISCHARGE DIAGNOSIS:  Principal Problem:   Pneumonia Active Problems:   Protein-calorie malnutrition, severe   SECONDARY DIAGNOSIS:   Past Medical History  Diagnosis Date  . Diabetes mellitus without complication ([place])   . High cholesterol   . RA (rheumatoid arthritis) ([place])   . Pulmonary fibrosis ([place])   . Monoclonal paraproteinemia 01/31/2016  . Shingles   . Status post bariatric surgery 02/13/2016    HOSPITAL COURSE:    * Sepsis due to community-acquired pneumonia  Evident by elevated white cell count, tachycardia and tachypnea.  Rocephin and azithromycin for now.  Try to collect sputum culture.- could not collect, but pt is improving.  Encourage to use incentive spirometry.  WBCs are coming down.  * Pulmonary fibrosis  She is on at trial medication, continue that, she will bring from home.  no need for home oxygen. Continue nebulizers.   Will advise pulm clinic visit.  * Hyperlipidemia  Continues statin.  * Generalized weakness  PT suggest HHA and PT.  DISCHARGE CONDITIONS:   Stable.  CONSULTS OBTAINED:     DRUG ALLERGIES:   Allergies  Allergen Reactions  . Plaquenil [Hydroxychloroquine Sulfate] Rash    DISCHARGE MEDICATIONS:   Current Discharge Medication List    START taking these medications   Details  azithromycin (ZITHROMAX) 250 MG tablet Take 1 tablet (250 mg total) by mouth daily. Qty: 5 each, Refills: 0    cefUROXime (CEFTIN) 250 MG tablet Take 1 tablet (250 mg total) by mouth 2 (two) times daily with a meal. Qty: 10 tablet, Refills: 0    feeding supplement (BOOST /  RESOURCE BREEZE) LIQD Take 1 Container by mouth 3 (three) times daily between meals. Qty: 237 mL, Refills: 0      CONTINUE these medications which have NOT CHANGED   Details  acetaminophen (TYLENOL) 500 MG tablet Take 500 mg by mouth every 6 (six) hours as needed.     alendronate (FOSAMAX) 70 MG tablet Take 1 tablet by mouth once a week.    aspirin EC 81 MG tablet Take 81 mg by mouth daily.    azelastine (ASTELIN) 0.1 % nasal spray Place 1 spray into both nostrils 2 (two) times daily as needed.     benzonatate (TESSALON) 100 MG capsule Take 100 mg by mouth every 8 (eight) hours as needed for cough.    glipiZIDE (GLUCOTROL) 5 MG tablet Take 2.5 mg by mouth daily before breakfast. (Dr wrote for 5mg  daily but patient decreased to 2.5mg  daily)    mirtazapine (REMERON) 15 MG tablet Take 1 tablet (15 mg total) by mouth at bedtime. Qty: 90 tablet, Refills: 1    OFEV 150 MG CAPS Take 150 mg by mouth 2 (two) times daily.     pantoprazole (PROTONIX) 20 MG tablet Take 1 tablet (20 mg total) by mouth daily. Qty: 90 tablet, Refills: 8   Associated Diagnoses: Gastroesophageal reflux disease without esophagitis    predniSONE (DELTASONE) 5 MG tablet Take 5 mg by mouth daily with breakfast. Take 1 to 1.5 by mouth daily for joint pain    simvastatin (ZOCOR) 40 MG tablet Take 1 tablet (40 mg total) by  mouth at bedtime. Qty: 90 tablet, Refills: 3    SYMBICORT 160-4.5 MCG/ACT inhaler Inhale 2 puffs into the lungs 2 (two) times daily.     traMADol (ULTRAM) 50 MG tablet Take 50 mg by mouth at bedtime as needed.     VENTOLIN HFA 108 (90 Base) MCG/ACT inhaler Inhale 2 puffs into the lungs every 6 (six) hours as needed.          DISCHARGE INSTRUCTIONS:    Follow with Pulm clinic in 2 weeks.  If you experience worsening of your admission symptoms, develop shortness of breath, life threatening emergency, suicidal or homicidal thoughts you must seek medical attention immediately by calling 911 or  calling your MD immediately  if symptoms less severe.  You Must read complete instructions/literature along with all the possible adverse reactions/side effects for all the Medicines you take and that have been prescribed to you. Take any new Medicines after you have completely understood and accept all the possible adverse reactions/side effects.   Please note  You were cared for by a hospitalist during your hospital stay. If you have any questions about your discharge medications or the care you received while you were in the hospital after you are discharged, you can call the unit and asked to speak with the hospitalist on call if the hospitalist that took care of you is not available. Once you are discharged, your primary care physician will handle any further medical issues. Please note that NO REFILLS for any discharge medications will be authorized once you are discharged, as it is imperative that you return to your primary care physician (or establish a relationship with a primary care physician if you do not have one) for your aftercare needs so that they can reassess your need for medications and monitor your lab values.    Today   CHIEF COMPLAINT:   Chief Complaint  Patient presents with  . Headache  . Abdominal Pain    HISTORY OF PRESENT ILLNESS:  Anita Atkins  is a 81 y.o. female with a known history of Diabetes, high cholesterol, pulmonary fibrosis, shingles, status post bariatric surgery with malnutrition- started having cough and chest pain for last 7-8 days which is gradually getting worse associated with shortness of breath. She decided to come to emergency room today and found to have right lower lobe pneumonia with tachycardia and tachypnea and elevated white blood cell count.   VITAL SIGNS:  Blood pressure 127/63, pulse 101, temperature 98 F (36.7 C), temperature source Oral, resp. rate 18, height 5' 1.5" (1.562 m), weight 40.824 kg (90 lb), SpO2 98 %.  I/O:    Intake/Output Summary (Last 24 hours) at 03/08/16 0844 Last data filed at 03/08/16 0720  Gross per 24 hour  Intake    480 ml  Output   1750 ml  Net  -1270 ml    PHYSICAL EXAMINATION:   GENERAL: 81 y.o.-year-old thin patient lying in the bed with no acute distress.  EYES: Pupils equal, round, reactive to light and accommodation. No scleral icterus. Extraocular muscles intact.  HEENT: Head atraumatic, normocephalic. Oropharynx and nasopharynx clear.  NECK: Supple, no jugular venous distention. No thyroid enlargement, no tenderness.  LUNGS: Normal breath sounds bilaterally, mild wheezing, some crepitation. No use of accessory muscles of respiration.  CARDIOVASCULAR: S1, S2 normal. No murmurs, rubs, or gallops.  ABDOMEN: Soft, nontender, nondistended. Bowel sounds present. No organomegaly or mass. Scar of surgery present. EXTREMITIES: No pedal edema, cyanosis, or clubbing.  NEUROLOGIC: Cranial nerves  II through XII are intact. Muscle strength 5/5 in all extremities. Sensation intact. Gait not checked.  PSYCHIATRIC: The patient is alert and oriented x 3.  SKIN: No obvious rash, lesion, or ulcer.   DATA REVIEW:   CBC  Recent Labs Lab 03/08/16 0633  WBC 9.3  HGB 11.3*  HCT 31.6*  PLT 428    Chemistries   Recent Labs Lab 03/06/16 0758 03/07/16 0529  NA 130* 134*  K 4.1 3.5  CL 95* 104  CO2 22 25  GLUCOSE 161* 261*  BUN 17 14  CREATININE 0.68 0.48  CALCIUM 8.2* 7.6*  AST 24  --   ALT 15  --   ALKPHOS 121  --   BILITOT 1.2  --     Cardiac Enzymes No results for input(s): TROPONINI in the last 168 hours.  Microbiology Results  Results for orders placed or performed during the hospital encounter of 03/06/16  Blood Culture (routine x 2)     Status: None (Preliminary result)   Collection Time: 03/06/16  7:58 AM  Result Value Ref Range Status   Specimen Description BLOOD RIGHT ASSIST CONTROL  Final   Special Requests BOTTLES DRAWN AEROBIC AND ANAEROBIC   [place]  Final   Culture NO GROWTH 1 DAY  Final   Report Status PENDING  Incomplete  Blood Culture (routine x 2)     Status: None (Preliminary result)   Collection Time: 03/06/16  7:59 AM  Result Value Ref Range Status   Specimen Description BLOOD LEFT ASSIST CONTROL  Final   Special Requests BOTTLES DRAWN AEROBIC AND ANAEROBIC  [place]  Final   Culture NO GROWTH 1 DAY  Final   Report Status PENDING  Incomplete  Urine culture     Status: None   Collection Time: 03/06/16 12:06 PM  Result Value Ref Range Status   Specimen Description URINE, RANDOM  Final   Special Requests NONE  Final   Culture NO GROWTH Performed at [facility]   Final   Report Status 03/07/2016 FINAL  Final    RADIOLOGY:  No results found.  EKG:   Orders placed or performed during the hospital encounter of 03/06/16  . EKG 12-Lead  . EKG 12-Lead      Management plans discussed with the patient, family and they are in agreement.  CODE STATUS:     Code Status Orders        Start     Ordered   03/06/16 0951  Full code   Continuous     03/06/16 0950    Code Status History    Date Active Date Inactive Code Status Order ID Comments User Context   This patient has a current code status but no historical code status.      TOTAL TIME TAKING CARE OF THIS PATIENT: 35 minutes.    Vaughan Basta M.D on 03/08/2016 at 8:44 AM  Between 7am to 6pm - Pager - [phone]  After 6pm go to [email or web address] - password [email or web address] [place] [facility]  Office  [phone]  CC: Primary care physician; Enid Derry, MD   Note: This dictation was prepared with Dragon dictation along with smaller phrase technology. Any transcriptional errors that result from this process are unintentional.

## 2016-03-08 NOTE — Discharge Instructions (Signed)

## 2016-03-08 NOTE — Progress Notes (Signed)
PHARMACIST - PHYSICIAN COMMUNICATION DR:   Anselm Jungling CONCERNING: Antibiotic IV to Oral Route Change Policy  RECOMMENDATION: This patient is receiving Azithromycin  by the intravenous route.  Based on criteria approved by the Pharmacy and Therapeutics Committee, the antibiotic(s) is/are being converted to the equivalent oral dose form(s).   DESCRIPTION: These criteria include:  Patient being treated for a respiratory tract infection, urinary tract infection, cellulitis or clostridium difficile associated diarrhea if on metronidazole  The patient is not neutropenic and does not exhibit a GI malabsorption state  The patient is eating (either orally or via tube) and/or has been taking other orally administered medications for a least 24 hours  The patient is improving clinically and has a Tmax < 100.5  If you have questions about this conversion, please contact the Pharmacy Department  []   [phone] )  [facility] [x]   [phone] )  [facility] []   [phone] )  [facility] []   [phone] )  [facility] []   [phone] )  [facility] Puna    [place], PharmD, BCPS Clinical Pharmacist

## 2016-03-09 DIAGNOSIS — E43 Unspecified severe protein-calorie malnutrition: Secondary | ICD-10-CM | POA: Diagnosis not present

## 2016-03-09 DIAGNOSIS — E119 Type 2 diabetes mellitus without complications: Secondary | ICD-10-CM | POA: Diagnosis not present

## 2016-03-09 DIAGNOSIS — Z9884 Bariatric surgery status: Secondary | ICD-10-CM | POA: Diagnosis not present

## 2016-03-09 DIAGNOSIS — D472 Monoclonal gammopathy: Secondary | ICD-10-CM | POA: Diagnosis not present

## 2016-03-09 DIAGNOSIS — Z7984 Long term (current) use of oral hypoglycemic drugs: Secondary | ICD-10-CM | POA: Diagnosis not present

## 2016-03-09 DIAGNOSIS — J189 Pneumonia, unspecified organism: Secondary | ICD-10-CM | POA: Diagnosis not present

## 2016-03-09 DIAGNOSIS — Z7982 Long term (current) use of aspirin: Secondary | ICD-10-CM | POA: Diagnosis not present

## 2016-03-09 DIAGNOSIS — J8410 Pulmonary fibrosis, unspecified: Secondary | ICD-10-CM | POA: Diagnosis not present

## 2016-03-09 DIAGNOSIS — M069 Rheumatoid arthritis, unspecified: Secondary | ICD-10-CM | POA: Diagnosis not present

## 2016-03-11 LAB — CULTURE, BLOOD (ROUTINE X 2)
CULTURE: NO GROWTH
CULTURE: NO GROWTH

## 2016-03-12 ENCOUNTER — Telehealth: Payer: Self-pay

## 2016-03-12 ENCOUNTER — Ambulatory Visit
Admission: RE | Admit: 2016-03-12 | Discharge: 2016-03-12 | Disposition: A | Discharge status: Home / Self Care | Payer: Commercial Managed Care - HMO | Source: Ambulatory Visit | Attending: Family Medicine | Admitting: Family Medicine

## 2016-03-12 ENCOUNTER — Encounter: Payer: Self-pay | Admitting: Family Medicine

## 2016-03-12 ENCOUNTER — Ambulatory Visit (INDEPENDENT_AMBULATORY_CARE_PROVIDER_SITE_OTHER): Payer: Commercial Managed Care - HMO | Admitting: Family Medicine

## 2016-03-12 VITALS — BP 116/72 | HR 99 | Temp 97.6°F | Resp 14 | Wt 89.0 lb

## 2016-03-12 DIAGNOSIS — M810 Age-related osteoporosis without current pathological fracture: Secondary | ICD-10-CM

## 2016-03-12 DIAGNOSIS — J189 Pneumonia, unspecified organism: Secondary | ICD-10-CM | POA: Diagnosis not present

## 2016-03-12 DIAGNOSIS — J8410 Pulmonary fibrosis, unspecified: Secondary | ICD-10-CM | POA: Diagnosis not present

## 2016-03-12 DIAGNOSIS — M069 Rheumatoid arthritis, unspecified: Secondary | ICD-10-CM | POA: Diagnosis not present

## 2016-03-12 DIAGNOSIS — E43 Unspecified severe protein-calorie malnutrition: Secondary | ICD-10-CM

## 2016-03-12 DIAGNOSIS — J188 Other pneumonia, unspecified organism: Secondary | ICD-10-CM | POA: Diagnosis present

## 2016-03-12 DIAGNOSIS — J181 Lobar pneumonia, unspecified organism: Principal | ICD-10-CM

## 2016-03-12 MED ORDER — MAGIC MOUTHWASH: 5.0000 mL | Freq: Four times a day (QID) | ORAL | Status: DC | PRN

## 2016-03-12 MED ORDER — LEVOFLOXACIN 500 MG PO TABS: 500.0000 mg | ORAL_TABLET | Freq: Every day | ORAL | Status: AC

## 2016-03-12 NOTE — Assessment & Plan Note (Signed)
Managed by Dr. Annalee Genta

## 2016-03-12 NOTE — Telephone Encounter (Signed)
Pt needs refill on test strips uses accu check

## 2016-03-12 NOTE — Progress Notes (Signed)
BP 116/72 mmHg  Pulse 99  Temp(Src) 97.6 F (36.4 C) (Oral)  Resp 14  Wt 89 lb (40.37 kg)  SpO2 98%   Subjective:    Patient ID: Anita Atkins, female    DOB: 1934/11/11, 81 y.o.   MRN: [id number]  HPI: Anita Atkins is a 81 y.o. female  Chief Complaint  Patient presents with  . Hospitalization Follow-up   Here for hospital follow-up; see discharge summary in Epic [facility]), reviewed Patient was feeling really badly; July 11th, found pneumonia; hx of pneumonia in the past Discharge home on July 13th Community-acquired pneumonia; no visits to nursing homes prior to getting sick to her recollection She was also diagnosed with severe protein-calorie malnutrition; she is trying to gain weight, eating more meat Traveled to [facility] April to June 6th; got shingles in [place] reviewed: WBC was 34k; down 19.5k and then 9.3k 4 days ago Calcium 7.6 in the hospital; taking vit D 2000 iu daily; toes cramps up Normal lactic acid Never able to submit sputum culture, never coughed up anything Albumin is 2.5; calcium was 8.2, but corrected was normal No cough, no fevers; checked temp on Sunday and normal Her voice is getting more strained and hoarse 3 days of severe headaches, in the hospital No energy  Depression screen [facility] 2/9 02/13/2016 06/15/2015  Decreased Interest 0 0  Down, Depressed, Hopeless 1 1  PHQ - 2 Score 1 1   Relevant past medical, surgical, family and social history reviewed Past Medical History  Diagnosis Date  . Diabetes mellitus without complication ([place])   . High cholesterol   . RA (rheumatoid arthritis) ([place])   . Pulmonary fibrosis ([place])   . Monoclonal paraproteinemia 01/31/2016  . Shingles   . Status post bariatric surgery 02/13/2016   Past Surgical History  Procedure Laterality Date  . Gastric bypass  2007  . Cardiac surgery    . Exploration post operative open heart    . Cholecystectomy     Family History  Problem Relation Age of Onset  .  Heart disease Mother   . Stroke Mother   . Diabetes Sister   . Hyperlipidemia Sister   . Cancer Maternal Aunt     female  . COPD Neg Hx   . Hypertension Neg Hx    Social History  Substance Use Topics  . Smoking status: Never Smoker   . Smokeless tobacco: Never Used  . Alcohol Use: No   Interim medical history since last visit reviewed. Allergies and medications reviewed  Review of Systems Per HPI unless specifically indicated above     Objective:    BP 116/72 mmHg  Pulse 99  Temp(Src) 97.6 F (36.4 C) (Oral)  Resp 14  Wt 89 lb (40.37 kg)  SpO2 98%  Wt Readings from Last 3 Encounters:  03/16/16 90 lb (40.824 kg)  03/12/16 89 lb (40.37 kg)  03/06/16 90 lb (40.824 kg)    Physical Exam  Constitutional: She appears well-developed. No distress.  Thin, cachectic; nontoxic  HENT:  Temporal wasting  Neck: No JVD present.  Cardiovascular: Normal rate and regular rhythm.   Pulmonary/Chest: Effort normal. She has decreased breath sounds. She has rhonchi (right lower lateral aspect).  Abdominal:  Scaphoid  Musculoskeletal:       Right shoulder: She exhibits normal range of motion.       Cervical back: She exhibits decreased range of motion. She exhibits no tenderness, no bony tenderness and no swelling.  Lymphadenopathy:  She has no cervical adenopathy.  Neurological: She is alert.  Skin: Ecchymosis (extensor surfaces arms) noted. She is not diaphoretic. No pallor.  Psychiatric: Her mood appears anxious (slightly anxious).   Results for orders placed or performed during the hospital encounter of 03/06/16  Blood Culture (routine x 2)  Result Value Ref Range   Specimen Description BLOOD LEFT ASSIST CONTROL    Special Requests BOTTLES DRAWN AEROBIC AND ANAEROBIC  [place]    Culture NO GROWTH 5 DAYS    Report Status 03/11/2016 FINAL   Blood Culture (routine x 2)  Result Value Ref Range   Specimen Description BLOOD RIGHT ASSIST CONTROL    Special Requests BOTTLES DRAWN  AEROBIC AND ANAEROBIC  [place]    Culture NO GROWTH 5 DAYS    Report Status 03/11/2016 FINAL   Urine culture  Result Value Ref Range   Specimen Description URINE, RANDOM    Special Requests NONE    Culture NO GROWTH Performed at [facility]     Report Status 03/07/2016 FINAL   CBC  Result Value Ref Range   WBC 34.4 (H) 3.6 - 11.0 K/uL   RBC 4.07 3.80 - 5.20 MIL/uL   Hemoglobin 13.6 12.0 - 16.0 g/dL   HCT 40.0 35.0 - 47.0 %   MCV 98.3 80.0 - 100.0 fL   MCH 33.4 26.0 - 34.0 pg   MCHC 34.0 32.0 - 36.0 g/dL   RDW 14.8 (H) 11.5 - 14.5 %   Platelets 413 150 - 440 K/uL  Comprehensive metabolic panel  Result Value Ref Range   Sodium 130 (L) 135 - 145 mmol/L   Potassium 4.1 3.5 - 5.1 mmol/L   Chloride 95 (L) 101 - 111 mmol/L   CO2 22 22 - 32 mmol/L   Glucose, Bld 161 (H) 65 - 99 mg/dL   BUN 17 6 - 20 mg/dL   Creatinine, Ser 0.68 0.44 - 1.00 mg/dL   Calcium 8.2 (L) 8.9 - 10.3 mg/dL   Total Protein 7.5 6.5 - 8.1 g/dL   Albumin 2.5 (L) 3.5 - 5.0 g/dL   AST 24 15 - 41 U/L   ALT 15 14 - 54 U/L   Alkaline Phosphatase 121 38 - 126 U/L   Total Bilirubin 1.2 0.3 - 1.2 mg/dL   GFR calc non Af Amer >60 >60 mL/min   GFR calc Af Amer >60 >60 mL/min   Anion gap 13 5 - 15  Lipase, blood  Result Value Ref Range   Lipase 13 11 - 51 U/L  Urinalysis complete, with microscopic ([facility] only)  Result Value Ref Range   Color, Urine YELLOW (A) YELLOW   APPearance CLEAR (A) CLEAR   Glucose, UA 150 (A) NEGATIVE mg/dL   Bilirubin Urine NEGATIVE NEGATIVE   Ketones, ur 1+ (A) NEGATIVE mg/dL   Specific Gravity, Urine 1.014 1.005 - 1.030   Hgb urine dipstick NEGATIVE NEGATIVE   pH 6.0 5.0 - 8.0   Protein, ur NEGATIVE NEGATIVE mg/dL   Nitrite NEGATIVE NEGATIVE   Leukocytes, UA NEGATIVE NEGATIVE   RBC / HPF NONE SEEN 0 - 5 RBC/hpf   WBC, UA 0-5 0 - 5 WBC/hpf   Bacteria, UA NONE SEEN NONE SEEN   Squamous Epithelial / LPF 0-5 (A) NONE SEEN   Mucous PRESENT   Lactic acid, plasma  Result Value Ref  Range   Lactic Acid, Venous 1.5 0.5 - 1.9 mmol/L  Lactic acid, plasma  Result Value Ref Range   Lactic Acid, Venous  1.4 0.5 - 1.9 mmol/L  Basic metabolic panel  Result Value Ref Range   Sodium 134 (L) 135 - 145 mmol/L   Potassium 3.5 3.5 - 5.1 mmol/L   Chloride 104 101 - 111 mmol/L   CO2 25 22 - 32 mmol/L   Glucose, Bld 261 (H) 65 - 99 mg/dL   BUN 14 6 - 20 mg/dL   Creatinine, Ser 0.48 0.44 - 1.00 mg/dL   Calcium 7.6 (L) 8.9 - 10.3 mg/dL   GFR calc non Af Amer >60 >60 mL/min   GFR calc Af Amer >60 >60 mL/min   Anion gap 5 5 - 15  CBC  Result Value Ref Range   WBC 19.5 (H) 3.6 - 11.0 K/uL   RBC 3.31 (L) 3.80 - 5.20 MIL/uL   Hemoglobin 11.1 (L) 12.0 - 16.0 g/dL   HCT 32.6 (L) 35.0 - 47.0 %   MCV 98.4 80.0 - 100.0 fL   MCH 33.6 26.0 - 34.0 pg   MCHC 34.1 32.0 - 36.0 g/dL   RDW 14.7 (H) 11.5 - 14.5 %   Platelets 352 150 - 440 K/uL  Glucose, capillary  Result Value Ref Range   Glucose-Capillary 305 (H) 65 - 99 mg/dL  Glucose, capillary  Result Value Ref Range   Glucose-Capillary 243 (H) 65 - 99 mg/dL  CBC  Result Value Ref Range   WBC 9.3 3.6 - 11.0 K/uL   RBC 3.27 (L) 3.80 - 5.20 MIL/uL   Hemoglobin 11.3 (L) 12.0 - 16.0 g/dL   HCT 31.6 (L) 35.0 - 47.0 %   MCV 96.5 80.0 - 100.0 fL   MCH 34.5 (H) 26.0 - 34.0 pg   MCHC 35.7 32.0 - 36.0 g/dL   RDW 14.9 (H) 11.5 - 14.5 %   Platelets 428 150 - 440 K/uL  Glucose, capillary  Result Value Ref Range   Glucose-Capillary 144 (H) 65 - 99 mg/dL  Glucose, capillary  Result Value Ref Range   Glucose-Capillary 143 (H) 65 - 99 mg/dL   Comment 1 Notify RN    Comment 2 Document in Chart       Assessment & Plan:   Problem List Items Addressed This Visit      Respiratory   Right lower lobe pneumonia - Primary   Relevant Medications   magic mouthwash SOLN   levofloxacin (LEVAQUIN) 500 MG tablet   Other Relevant Orders   Ambulatory referral to Pulmonology   DG Chest 2 View (Completed)   Pulmonary fibrosis (HCC) (Chronic)     Seeing pulmonologist      Relevant Orders   Ambulatory referral to Pulmonology   DG Chest 2 View (Completed)     Musculoskeletal and Integument   Rheumatoid arthritis (HCC) (Chronic)    Managed by rheum, referral entered      Relevant Orders   Ambulatory referral to Rheumatology   Osteoporosis (Chronic)    Managed by Dr. Annalee Genta      Relevant Orders   Ambulatory referral to Rheumatology     Other   Protein-calorie malnutrition, severe    Underweight; low albumin; encouraged magic cup         Follow up plan: No Follow-up on file.  An after-visit summary was printed and given to the patient at [place].  Please see the patient instructions which may contain other information and recommendations beyond what is mentioned above in the assessment and plan.  Meds ordered this encounter  Medications  . magic mouthwash SOLN  Sig: Take 5 mLs by mouth 4 (four) times daily as needed for mouth pain.    Dispense:  280 mL    Refill:  0  . levofloxacin (LEVAQUIN) 500 MG tablet    Sig: Take 1 tablet (500 mg total) by mouth daily. This replaces the other antibiotics    Dispense:  7 tablet    Refill:  0   Orders Placed This Encounter  Procedures  . DG Chest 2 View  . Ambulatory referral to Pulmonology  . Ambulatory referral to Rheumatology

## 2016-03-12 NOTE — Assessment & Plan Note (Signed)
Seeing pulmonologist 

## 2016-03-12 NOTE — Patient Instructions (Addendum)
Lay off the tessalon perles for a few days and see if you are able to expectorate anything Use the magic mouthwash WITHOUT lidocaine Try Magic Cup or Mighty Shakes for albumin/nutrition Stop the current antibiotics Start the levaquin Stop your sugar pill (glipizide) while on the levaquin Call me with update tomorrow

## 2016-03-12 NOTE — Assessment & Plan Note (Signed)
Underweight; low albumin; encouraged magic cup

## 2016-03-12 NOTE — Assessment & Plan Note (Signed)
Managed by rheum, referral entered

## 2016-03-13 ENCOUNTER — Telehealth: Payer: Self-pay | Admitting: Family Medicine

## 2016-03-13 DIAGNOSIS — D472 Monoclonal gammopathy: Secondary | ICD-10-CM | POA: Diagnosis not present

## 2016-03-13 DIAGNOSIS — E119 Type 2 diabetes mellitus without complications: Secondary | ICD-10-CM | POA: Diagnosis not present

## 2016-03-13 DIAGNOSIS — Z9884 Bariatric surgery status: Secondary | ICD-10-CM | POA: Diagnosis not present

## 2016-03-13 DIAGNOSIS — M069 Rheumatoid arthritis, unspecified: Secondary | ICD-10-CM | POA: Diagnosis not present

## 2016-03-13 DIAGNOSIS — J8410 Pulmonary fibrosis, unspecified: Secondary | ICD-10-CM | POA: Diagnosis not present

## 2016-03-13 DIAGNOSIS — Z7984 Long term (current) use of oral hypoglycemic drugs: Secondary | ICD-10-CM | POA: Diagnosis not present

## 2016-03-13 DIAGNOSIS — E43 Unspecified severe protein-calorie malnutrition: Secondary | ICD-10-CM | POA: Diagnosis not present

## 2016-03-13 DIAGNOSIS — J189 Pneumonia, unspecified organism: Secondary | ICD-10-CM | POA: Diagnosis not present

## 2016-03-13 DIAGNOSIS — Z7982 Long term (current) use of aspirin: Secondary | ICD-10-CM | POA: Diagnosis not present

## 2016-03-13 MED ORDER — GLUCOSE BLOOD VI STRP: ORAL_STRIP | Status: DC

## 2016-03-13 NOTE — Telephone Encounter (Signed)
I"m not sure what this is for

## 2016-03-13 NOTE — Telephone Encounter (Signed)
Omega from [facility] calling pertaining to referral appointment. Patient refused appointment states that it was to far for her to go [phone]

## 2016-03-14 DIAGNOSIS — M0579 Rheumatoid arthritis with rheumatoid factor of multiple sites without organ or systems involvement: Secondary | ICD-10-CM | POA: Diagnosis not present

## 2016-03-14 DIAGNOSIS — D472 Monoclonal gammopathy: Secondary | ICD-10-CM | POA: Diagnosis not present

## 2016-03-14 DIAGNOSIS — J8410 Pulmonary fibrosis, unspecified: Secondary | ICD-10-CM | POA: Diagnosis not present

## 2016-03-14 DIAGNOSIS — M810 Age-related osteoporosis without current pathological fracture: Secondary | ICD-10-CM | POA: Diagnosis not present

## 2016-03-14 DIAGNOSIS — Z79899 Other long term (current) drug therapy: Secondary | ICD-10-CM | POA: Diagnosis not present

## 2016-03-14 DIAGNOSIS — E119 Type 2 diabetes mellitus without complications: Secondary | ICD-10-CM | POA: Diagnosis not present

## 2016-03-15 DIAGNOSIS — Z7984 Long term (current) use of oral hypoglycemic drugs: Secondary | ICD-10-CM | POA: Diagnosis not present

## 2016-03-15 DIAGNOSIS — D472 Monoclonal gammopathy: Secondary | ICD-10-CM | POA: Diagnosis not present

## 2016-03-15 DIAGNOSIS — E43 Unspecified severe protein-calorie malnutrition: Secondary | ICD-10-CM | POA: Diagnosis not present

## 2016-03-15 DIAGNOSIS — M069 Rheumatoid arthritis, unspecified: Secondary | ICD-10-CM | POA: Diagnosis not present

## 2016-03-15 DIAGNOSIS — J8410 Pulmonary fibrosis, unspecified: Secondary | ICD-10-CM | POA: Diagnosis not present

## 2016-03-15 DIAGNOSIS — E119 Type 2 diabetes mellitus without complications: Secondary | ICD-10-CM | POA: Diagnosis not present

## 2016-03-15 DIAGNOSIS — J189 Pneumonia, unspecified organism: Secondary | ICD-10-CM | POA: Diagnosis not present

## 2016-03-15 DIAGNOSIS — Z9884 Bariatric surgery status: Secondary | ICD-10-CM | POA: Diagnosis not present

## 2016-03-15 DIAGNOSIS — Z7982 Long term (current) use of aspirin: Secondary | ICD-10-CM | POA: Diagnosis not present

## 2016-03-15 NOTE — Telephone Encounter (Signed)
Im not either neither does pt?

## 2016-03-16 ENCOUNTER — Encounter: Payer: Self-pay | Admitting: Internal Medicine

## 2016-03-16 ENCOUNTER — Ambulatory Visit (INDEPENDENT_AMBULATORY_CARE_PROVIDER_SITE_OTHER): Payer: Commercial Managed Care - HMO | Admitting: Internal Medicine

## 2016-03-16 VITALS — BP 120/66 | HR 87 | Ht 61.5 in | Wt 90.0 lb

## 2016-03-16 DIAGNOSIS — J189 Pneumonia, unspecified organism: Secondary | ICD-10-CM

## 2016-03-16 DIAGNOSIS — J181 Lobar pneumonia, unspecified organism: Principal | ICD-10-CM

## 2016-03-16 NOTE — Patient Instructions (Signed)
Follow up in 4 weeks with CXR  Continue antibiotics as prescribed Continue inhaled medications as prescribed  Pulmonary fibrosisPulmonary Fibrosis Pulmonary fibrosis is a type of lung disease that causes scarring. Over time, the scar tissue (fibrosis) builds up in the air sacs of your lungs. This makes it hard for you to breathe. Less oxygen can get into your blood. Scarring from pulmonary fibrosis gets worse over time. This damage is permanent. Having damaged lungs may make it more likely that you will have heart problems as well. CAUSES Usually, the cause of pulmonary fibrosis is not known (idiopathic pulmonary fibrosis). However, pulmonary fibrosis can be caused by:   Exposure to occupational and environmental toxins. These include asbestos, silica, and metal dusts.  Inhaling moldy hay. This can cause an allergic reaction in the lung (farmer's lung) that can lead to pulmonary fibrosis.  Inhaling toxic fumes.  Certain medicines. These include drugs used in radiation therapy or used to treat seizures, heart problems, and some infections.  Autoimmune diseases, such as rheumatoid arthritis, systemic sclerosis, and connective tissue diseases.  Sarcoidosis. In this disease, areas of inflammatory cells (granulomas) form and most often affect the lungs.  Genes. Some cases of pulmonary fibrosis may be passed down through families. RISK FACTORS You may be at a higher risk for developing pulmonary fibrosis if:  You have a family history of the disease.  You have an autoimmune disease or another condition linked to pulmonary fibrosis.  You are exposed to certain substances or fumes found in agricultural, farm, Holiday representative, or factory work.  You take certain medicines. SIGNS AND SYMPTOMS Symptoms may include:  Difficulty breathing that gets worse with activity.  Dry, hacking cough.  Rapid, shallow breathing during exercise or while at rest.  Shortness of breath that gets worse  (dyspnea).  Bluish skin and lips.  Loss of appetite.  Loss of strength.  Weight loss and fatigue.  Rounded and enlarged fingertips (clubbing). DIAGNOSIS Your health care provider may suspect pulmonary fibrosis based on your symptoms and medical history. Diagnosis may include a physical exam. Your health care provider will check for signs that strongly suggest that you have pulmonary fibrosis, such as:  Blue skin around your fingernails or mouth from reduced oxygen.  Clubbing around the ends of your fingers.  A crackling sound when you breathe. Your health care provider may also do tests to confirm the diagnosis. These may include:  Looking inside your lungs with an instrument (bronchoscopy).  Imaging studies of your lungs and heart using:  X-rays.  CT scan.  Sound waves (echocardiogram).  Tests to measure how well you are breathing (pulmonary function tests).  Exercise testing to see how well your lungs work while you are walking.  Blood tests.  A procedure to remove a small piece of lung tissue to examine in a lab (biopsy). TREATMENT There is no cure for pulmonary fibrosis. Treatments focus on managing symptoms and preventing scarring from getting worse. This can include:  Medicines.  You may take steroids to prevent permanent lung changes. Your health care provider may put you on a high dose at first, then on lower dosages for the long term.  Medicines to suppress your body's defense (immune) system. These can have serious side effects.  You may be monitored with X-rays and laboratory work.  Oxygen therapy may be helpful if oxygen in your blood is low.  Surgery. In some cases, a lung transplant is an option. HOME CARE INSTRUCTIONS  Take medicines only as directed by  your health care provider.  Keep your vaccinations up to date as recommended by your health care provider.  Do not use any tobacco products, including cigarettes, chewing tobacco, or electronic  cigarettes. If you need help quitting, ask your health care provider.  Get regular exercise, but do not overexert yourself. Ask your health care provider to suggest some activities that are safe for you to do. Walking and chair exercises can help if you have physical limitations.  Consider joining a pulmonary rehabilitation program or a support group for people with pulmonary fibrosis.  Eat small meals often so you do not get too full. Overeating can make breathing trouble worse.  Maintain a healthy weight. Lose weight if you need to.  Do breathing exercises as directed by your health care provider.  Keep all follow-up visits as directed by your health care provider. This is important. SEEK MEDICAL CARE IF:  You are not able to be as active as usual.  You have a long-lasting (chronic) cough.  You are often short of breath.  You have a fever or chills. SEEK IMMEDIATE MEDICAL CARE IF:  You have chest pain.  Your breathing is much worse.  You cannot take a deep breath.  You have blue skin around your mouth or fingers.  You have clubbing of your fingers.  You cough up mucus that is dark in color.  You have a lot of headaches.  You get very confused or sleepy.   This information is not intended to replace advice given to you by your health care provider. Make sure you discuss any questions you have with your health care provider.   Document Released: 11/03/2003 Document Revised: 09/03/2014 Document Reviewed: 01/21/2014 Elsevier Interactive Patient Education [employer].

## 2016-03-16 NOTE — Progress Notes (Signed)
[facility]      Date: 03/16/2016,   MRN# [id number] Anita [facility] [birth year] Code Status:  Hosp day:@LENGTHOFSTAYDAYS @ Referring MD: @ATDPROV @     PCP:      AdmissionWeight: 90 lb (40.824 kg)                 CurrentWeight: 90 lb (40.824 kg) Anita [facility] is a 81 y.o. old female seen in [facility] for pulmonary fibrosis     CHIEF COMPLAINT:  Follow Chronic SOB and WOB, follow up pneumonia H/o pulmonary fibrosis    HISTORY OF PRESENT ILLNESS  81 yo white female seen today for previous dx of pulm fibrosis apporx 10-15 years ago Her symptoms are SOB and DOE chronic, associated with weakness and palpitations with exertion, Patient risk factors includes heavy second hand smoke exposure,  She was recently admitted for acute pneumonia CXR RLL pneumonia Has re-started  OFEV 1  Patient has no fevers, chills at this time, no evidence of infection Patient sitting up and breathing comfortably   6MWT 1000 feet no oxygen desats PFT 07/08/15 Ratio 92%  FVC 2.3 L 51% TLC 4.6 58% DLCO 48%  ONO 06/2015 negative       Current Medication:  Current outpatient prescriptions:  .  acetaminophen (TYLENOL) 500 MG tablet, Take 500 mg by mouth every 6 (six) hours as needed. , Disp: , Rfl:  .  alendronate (FOSAMAX) 70 MG tablet, Take 1 tablet by mouth once a week., Disp: , Rfl:  .  aspirin EC 81 MG tablet, Take 81 mg by mouth daily., Disp: , Rfl:  .  azelastine (ASTELIN) 0.1 % nasal spray, Place 1 spray into both nostrils 2 (two) times daily as needed. , Disp: , Rfl:  .  benzonatate (TESSALON) 100 MG capsule, Take 100 mg by mouth every 8 (eight) hours as needed for cough. Reported on 03/16/2016, Disp: , Rfl:  .  feeding supplement (BOOST / RESOURCE BREEZE) LIQD, Take 1 Container by mouth 3 (three) times daily between meals., Disp: 237 mL, Rfl: 0 .  glipiZIDE (GLUCOTROL) 5 MG tablet, Take 2.5 mg by mouth daily before breakfast. (Dr wrote for 5mg  daily but  patient decreased to 2.5mg  daily), Disp: , Rfl:  .  glucose blood (ACCU-CHEK AVIVA) test strip, Use as instructed to check sugars once a day; E11.9, LON 99 months, Disp: 100 each, Rfl: 1 .  levofloxacin (LEVAQUIN) 500 MG tablet, Take 1 tablet (500 mg total) by mouth daily. This replaces the other antibiotics, Disp: 7 tablet, Rfl: 0 .  magic mouthwash SOLN, Take 5 mLs by mouth 4 (four) times daily as needed for mouth pain., Disp: 280 mL, Rfl: 0 .  mirtazapine (REMERON) 15 MG tablet, Take 1 tablet (15 mg total) by mouth at bedtime., Disp: 90 tablet, Rfl: 1 .  OFEV 150 MG CAPS, Take 150 mg by mouth 2 (two) times daily. , Disp: , Rfl:  .  pantoprazole (PROTONIX) 20 MG tablet, Take 1 tablet (20 mg total) by mouth daily., Disp: 90 tablet, Rfl: 8 .  predniSONE (DELTASONE) 5 MG tablet, Take 5 mg by mouth daily with breakfast. Take 1 to 1.5 by mouth daily for joint pain, Disp: , Rfl:  .  simvastatin (ZOCOR) 40 MG tablet, Take 1 tablet (40 mg total) by mouth at bedtime., Disp: 90 tablet, Rfl: 3 .  SYMBICORT 160-4.5 MCG/ACT inhaler, Inhale 2 puffs into the lungs 2 (two) times daily. , Disp: , Rfl:  .  traMADol (ULTRAM) 50 MG  tablet, Take 1 tablet (50 mg total) by mouth every 12 (twelve) hours as needed for moderate pain or severe pain., Disp: 20 tablet, Rfl: 0 .  VENTOLIN HFA 108 (90 Base) MCG/ACT inhaler, Inhale 2 puffs into the lungs every 6 (six) hours as needed. , Disp: , Rfl:     ALLERGIES   Plaquenil     REVIEW OF SYSTEMS   Review of Systems  Constitutional: Negative for fever, chills, weight loss and malaise/fatigue.  HENT: Negative for congestion.   Eyes: Negative for blurred vision and double vision.  Respiratory: Positive for shortness of breath. Negative for cough.   Cardiovascular: Negative for chest pain, palpitations and leg swelling.  Gastrointestinal: Negative for heartburn.  Genitourinary: Negative.   Skin: Negative for rash.  Neurological: Negative for dizziness, weakness and  headaches.  All other systems reviewed and are negative.    VS: BP 120/66 mmHg  Pulse 87  Ht 5' 1.5" (1.562 m)  Wt 90 lb (40.824 kg)  BMI 16.73 kg/m2  SpO2 95%     PHYSICAL EXAM  Physical Exam  Constitutional: She is oriented to person, place, and time. No distress.  Thin and cachectic  Eyes: EOM are normal.  Cardiovascular: Normal rate, regular rhythm and normal heart sounds.   No murmur heard. Pulmonary/Chest: No stridor. No respiratory distress. She has no wheezes. She has rales.  Musculoskeletal: Normal range of motion. She exhibits no edema.  Neurological: She is alert and oriented to person, place, and time.  Skin: She is not diaphoretic.  Psychiatric: She has a normal mood and affect.         CXR on 03/12/16 Images reviewed on 03/16/2016 RLL opacity seen       ASSESSMENT/PLAN   81 yo very pleasant white female with Pulmonary Fibrosis(no biopsy) for many years with chronic SOB and DOE with chronic cough and intermittent chest tightness with recent RLL pneumonia  1.continue inhaled symbicort, albuterol as needed 2.contiue PPI 3..continue cough suppressant as needed 4..follow up with cardiology as needed with dr Rockey Situ as needed 5.follow up GI consults for h/o gastric bypass as needed 6.continue OFEV and prednisone as prescribed 7.finish rest of abx as prescribed 8.follow up CXR in 4 weeks   Follow up in 4 weeks after CXR completed, if remains abnormal, will obtain Ct chest   I have personally obtained a history, examined the patient, evaluated laboratory and independently reviewed imaging results, formulated the assessment and plan and placed orders.  The Patient requires high complexity decision making for assessment and support, frequent evaluation and titration of therapies, application of advanced monitoring technologies and extensive interpretation of multiple databases.  Patient satisfied with Plan of action and management. All questions  answered  Corrin Parker, M.D.  [facility]  Medical Director [place] Director [facility] Cardio-Pulmonary Department

## 2016-03-19 DIAGNOSIS — J189 Pneumonia, unspecified organism: Secondary | ICD-10-CM | POA: Diagnosis not present

## 2016-03-19 DIAGNOSIS — Z7984 Long term (current) use of oral hypoglycemic drugs: Secondary | ICD-10-CM | POA: Diagnosis not present

## 2016-03-19 DIAGNOSIS — Z7982 Long term (current) use of aspirin: Secondary | ICD-10-CM | POA: Diagnosis not present

## 2016-03-19 DIAGNOSIS — M069 Rheumatoid arthritis, unspecified: Secondary | ICD-10-CM | POA: Diagnosis not present

## 2016-03-19 DIAGNOSIS — E43 Unspecified severe protein-calorie malnutrition: Secondary | ICD-10-CM | POA: Diagnosis not present

## 2016-03-19 DIAGNOSIS — Z9884 Bariatric surgery status: Secondary | ICD-10-CM | POA: Diagnosis not present

## 2016-03-19 DIAGNOSIS — E119 Type 2 diabetes mellitus without complications: Secondary | ICD-10-CM | POA: Diagnosis not present

## 2016-03-19 DIAGNOSIS — J8410 Pulmonary fibrosis, unspecified: Secondary | ICD-10-CM | POA: Diagnosis not present

## 2016-03-19 DIAGNOSIS — D472 Monoclonal gammopathy: Secondary | ICD-10-CM | POA: Diagnosis not present

## 2016-03-20 ENCOUNTER — Ambulatory Visit (INDEPENDENT_AMBULATORY_CARE_PROVIDER_SITE_OTHER): Payer: Commercial Managed Care - HMO | Admitting: Cardiovascular Disease

## 2016-03-20 ENCOUNTER — Encounter: Payer: Self-pay | Admitting: Cardiovascular Disease

## 2016-03-20 ENCOUNTER — Encounter (INDEPENDENT_AMBULATORY_CARE_PROVIDER_SITE_OTHER): Payer: Self-pay

## 2016-03-20 VITALS — BP 140/70 | HR 93 | Ht 61.5 in | Wt 91.8 lb

## 2016-03-20 DIAGNOSIS — R0602 Shortness of breath: Secondary | ICD-10-CM

## 2016-03-20 DIAGNOSIS — I2510 Atherosclerotic heart disease of native coronary artery without angina pectoris: Secondary | ICD-10-CM

## 2016-03-20 DIAGNOSIS — E7800 Pure hypercholesterolemia, unspecified: Secondary | ICD-10-CM | POA: Diagnosis not present

## 2016-03-20 DIAGNOSIS — J8410 Pulmonary fibrosis, unspecified: Secondary | ICD-10-CM | POA: Diagnosis not present

## 2016-03-20 DIAGNOSIS — J189 Pneumonia, unspecified organism: Secondary | ICD-10-CM

## 2016-03-20 DIAGNOSIS — E119 Type 2 diabetes mellitus without complications: Secondary | ICD-10-CM

## 2016-03-20 DIAGNOSIS — J181 Lobar pneumonia, unspecified organism: Secondary | ICD-10-CM

## 2016-03-20 NOTE — Patient Instructions (Addendum)
More food, Vit D 5 K a day For anemia, take iron Please decrease the simvastatin down to 20 mg daily    Follow-Up: It was a pleasure seeing you in the office today. Please call us if you have new issues that need to be addressed before your next appt.  [phone]  Your physician wants you to follow-up in: 6 months.  You will receive a reminder letter in the mail two months in advance. If you don't receive a letter, please call our office to schedule the follow-up appointment.  If you need a refill on your cardiac medications before your next appointment, please call your pharmacy.

## 2016-03-20 NOTE — Progress Notes (Signed)
Cardiology Office Note  Date:  03/20/2016   ID:  Fleda Boga, DOB 1934/11/24, MRN [id number]  PCP:  Enid Derry, MD   Chief Complaint  Patient presents with  . Other    6 month follow up. Meds reviewed by the patient verbally. Pt. c/o shortness of breath.     HPI:  Ms. Lacharite  Is a very pleasant 81 year old woman with history of pulmonary fibrosis, chronic shortness of breath,  Followed by pulmonary,  Diabetes, history of gastric bypass, coronary artery disease with proximal LAD stenosis, CABG with vein graft to the LAD per the patient ( records have been requested),  Who presents for routine follow-up of her coronary artery disease.  Since her last clinic visit, she has lost 10 pounds She developed right chest pain, general malaise Recent hospital admission for pneumonia Today reports she is starting to feel better, has been weak, anorexia  She denies any recent symptoms concerning for angina  Also developed shingles at the end of May 2017 Currently not on oxygen, reports that she does not qualify but did feel better on oxygen in the hospital  She is on OFEV  Twice a day which she reports helps her breathing She is taking simvastatin 40 mg daily, previously was on 80 mg, followed by 20 mg on her last clinic visit  No regular exercise program, limited by her shortness of breath   EKG on today's visit shows normal sinus rhythm with rate 95 bpm, no significant ST or T-wave changes  Other past medical history Cardiac surgery 08/2012 at [facility] in [place], [place].  Typical anginal symptoms included chest tightness radiating up to her jaw and left arm.   she was on diabetes medications in the past and she stopped these.   PMH:   has a past medical history of Diabetes mellitus without complication ([place]); High cholesterol; Monoclonal paraproteinemia (01/31/2016); Pulmonary fibrosis ([place]); RA (rheumatoid arthritis) ([place]); Shingles; and Status post bariatric  surgery (02/13/2016).  PSH:    Past Surgical History:  Procedure Laterality Date  . CARDIAC SURGERY    . CHOLECYSTECTOMY    . EXPLORATION POST OPERATIVE OPEN HEART    . GASTRIC BYPASS  2007    Current Outpatient Prescriptions  Medication Sig Dispense Refill  . acetaminophen (TYLENOL) 500 MG tablet Take 500 mg by mouth every 6 (six) hours as needed.     Marland Kitchen alendronate (FOSAMAX) 70 MG tablet Take 1 tablet by mouth once a week.    Marland Kitchen aspirin EC 81 MG tablet Take 81 mg by mouth daily.    Marland Kitchen azelastine (ASTELIN) 0.1 % nasal spray Place 1 spray into both nostrils 2 (two) times daily as needed.     . benzonatate (TESSALON) 100 MG capsule Take 100 mg by mouth every 8 (eight) hours as needed for cough. Reported on 03/16/2016    . feeding supplement (BOOST / RESOURCE BREEZE) LIQD Take 1 Container by mouth 3 (three) times daily between meals. 237 mL 0  . glipiZIDE (GLUCOTROL) 5 MG tablet Take 2.5 mg by mouth daily before breakfast. (Dr wrote for 5mg  daily but patient decreased to 2.5mg  daily)    . glucose blood (ACCU-CHEK AVIVA) test strip Use as instructed to check sugars once a day; E11.9, LON 99 months 100 each 1  . magic mouthwash SOLN Take 5 mLs by mouth 4 (four) times daily as needed for mouth pain. 280 mL 0  . mirtazapine (REMERON) 15 MG tablet Take 1 tablet (15 mg  total) by mouth at bedtime. 90 tablet 1  . OFEV 150 MG CAPS Take 150 mg by mouth 2 (two) times daily.     . pantoprazole (PROTONIX) 20 MG tablet Take 1 tablet (20 mg total) by mouth daily. 90 tablet 8  . predniSONE (DELTASONE) 5 MG tablet Take 5 mg by mouth daily with breakfast. Take 1 to 1.5 by mouth daily for joint pain    . simvastatin (ZOCOR) 40 MG tablet Take 1 tablet (40 mg total) by mouth at bedtime. 90 tablet 3  . SYMBICORT 160-4.5 MCG/ACT inhaler Inhale 2 puffs into the lungs 2 (two) times daily.     . traMADol (ULTRAM) 50 MG tablet Take 1 tablet (50 mg total) by mouth every 12 (twelve) hours as needed for moderate pain or  severe pain. 20 tablet 0  . VENTOLIN HFA 108 (90 Base) MCG/ACT inhaler Inhale 2 puffs into the lungs every 6 (six) hours as needed.      No current facility-administered medications for this visit.      Allergies:   Plaquenil [hydroxychloroquine sulfate]   Social History:  The patient  reports that she has never smoked. She has never used smokeless tobacco. She reports that she does not drink alcohol or use drugs.   Family History:   family history includes Cancer in her maternal aunt; Diabetes in her sister; Heart disease in her mother; Hyperlipidemia in her sister; Stroke in her mother.    Review of Systems: Review of Systems  Constitutional: Positive for malaise/fatigue and weight loss.  Respiratory: Positive for shortness of breath.   Cardiovascular: Negative.   Gastrointestinal: Negative.   Musculoskeletal:       Unsteady gait  Neurological: Negative.   Psychiatric/Behavioral: Negative.   All other systems reviewed and are negative.    PHYSICAL EXAM: VS:  BP 140/70 (BP Location: Left Arm, Patient Position: Sitting, Cuff Size: Normal)   Pulse 93   Ht 5' 1.5" (1.562 m)   Wt 91 lb 12 oz (41.6 kg)   BMI 17.06 kg/m  , BMI Body mass index is 17.06 kg/m. GEN: Thin,  in no acute distress  HEENT: normal  Neck: no JVD, carotid bruits, or masses Cardiac: RRR; no murmurs, rubs, or gallops,no edema  Respiratory:  Diffuse crackles bilaterally appreciated anterior and posterior, mild increased work of breathing GI: soft, nontender, nondistended, + BS MS: no deformity or atrophy  Skin: warm and dry, no rash Neuro:  Strength and sensation are intact Psych: euthymic mood, full affect    Recent Labs: 02/13/2016: TSH 0.86 03/06/2016: ALT 15 03/07/2016: BUN 14; Creatinine, Ser 0.48; Potassium 3.5; Sodium 134 03/08/2016: Hemoglobin 11.3; Platelets 428    Lipid Panel Lab Results  Component Value Date   CHOL 138 02/13/2016   HDL 67 02/13/2016   LDLCALC 45 02/13/2016   TRIG 132  02/13/2016      Wt Readings from Last 3 Encounters:  03/20/16 91 lb 12 oz (41.6 kg)  03/16/16 90 lb (40.8 kg)  03/12/16 89 lb (40.4 kg)       ASSESSMENT AND PLAN:  Coronary artery disease involving native coronary artery of native heart without angina pectoris - Plan: EKG 12-Lead Currently with no symptoms of angina. No further workup at this time. Continue current medication regimen. Long discussion concerning her previous anginal symptoms Currently with no chest pain, no radiation to her jaw  Pulmonary fibrosis (HCC) Chronic shortness of breath, Slow steady progression, followed by pulmonary  High blood cholesterol Recommended she  decrease her simvastatin down to 20 minute grams daily given recent 10 pound weight loss  Type 2 diabetes mellitus without complication, without long-term current use of insulin (HCC) Hemoglobin A1c 7.2 Difficult situation given 10 pound weight loss, long discussion concerning her diet, need to not miss meals, avoid further weight loss  Right lower lobe pneumonia Hospital records reviewed Recovered after recent hospitalization. Still feels weak, better today than previous several days    Total encounter time more than 25 minutes  Greater than 50% was spent in counseling and coordination of care with the patient   Disposition:   F/U  6 months   Orders Placed This Encounter  Procedures  . EKG 12-Lead     Signed, Esmond Plants, M.D., Ph.D. 03/20/2016  [place], [place]

## 2016-03-22 ENCOUNTER — Telehealth: Payer: Self-pay | Admitting: Family Medicine

## 2016-03-22 DIAGNOSIS — J8410 Pulmonary fibrosis, unspecified: Secondary | ICD-10-CM | POA: Diagnosis not present

## 2016-03-22 DIAGNOSIS — E119 Type 2 diabetes mellitus without complications: Secondary | ICD-10-CM | POA: Diagnosis not present

## 2016-03-22 DIAGNOSIS — H269 Unspecified cataract: Secondary | ICD-10-CM

## 2016-03-22 DIAGNOSIS — Z9884 Bariatric surgery status: Secondary | ICD-10-CM | POA: Diagnosis not present

## 2016-03-22 DIAGNOSIS — D472 Monoclonal gammopathy: Secondary | ICD-10-CM | POA: Diagnosis not present

## 2016-03-22 DIAGNOSIS — M069 Rheumatoid arthritis, unspecified: Secondary | ICD-10-CM | POA: Diagnosis not present

## 2016-03-22 DIAGNOSIS — E43 Unspecified severe protein-calorie malnutrition: Secondary | ICD-10-CM | POA: Diagnosis not present

## 2016-03-22 DIAGNOSIS — Z7984 Long term (current) use of oral hypoglycemic drugs: Secondary | ICD-10-CM | POA: Diagnosis not present

## 2016-03-22 DIAGNOSIS — J189 Pneumonia, unspecified organism: Secondary | ICD-10-CM | POA: Diagnosis not present

## 2016-03-22 DIAGNOSIS — Z7982 Long term (current) use of aspirin: Secondary | ICD-10-CM | POA: Diagnosis not present

## 2016-03-22 NOTE — Telephone Encounter (Signed)
Not sure of reason; reviewed prob list and I see diabetes and cataracts so I'll enter both of those

## 2016-03-22 NOTE — Assessment & Plan Note (Signed)
Refer to [facility] requested by patient

## 2016-03-22 NOTE — Assessment & Plan Note (Signed)
Patient requested eye visit to [facility]

## 2016-03-23 DIAGNOSIS — J8410 Pulmonary fibrosis, unspecified: Secondary | ICD-10-CM | POA: Diagnosis not present

## 2016-03-23 DIAGNOSIS — E119 Type 2 diabetes mellitus without complications: Secondary | ICD-10-CM | POA: Diagnosis not present

## 2016-03-23 DIAGNOSIS — E43 Unspecified severe protein-calorie malnutrition: Secondary | ICD-10-CM | POA: Diagnosis not present

## 2016-03-23 DIAGNOSIS — Z7982 Long term (current) use of aspirin: Secondary | ICD-10-CM | POA: Diagnosis not present

## 2016-03-23 DIAGNOSIS — J189 Pneumonia, unspecified organism: Secondary | ICD-10-CM | POA: Diagnosis not present

## 2016-03-23 DIAGNOSIS — Z7984 Long term (current) use of oral hypoglycemic drugs: Secondary | ICD-10-CM | POA: Diagnosis not present

## 2016-03-23 DIAGNOSIS — D472 Monoclonal gammopathy: Secondary | ICD-10-CM | POA: Diagnosis not present

## 2016-03-23 DIAGNOSIS — M069 Rheumatoid arthritis, unspecified: Secondary | ICD-10-CM | POA: Diagnosis not present

## 2016-03-23 DIAGNOSIS — Z9884 Bariatric surgery status: Secondary | ICD-10-CM | POA: Diagnosis not present

## 2016-03-28 DIAGNOSIS — D472 Monoclonal gammopathy: Secondary | ICD-10-CM | POA: Diagnosis not present

## 2016-03-28 DIAGNOSIS — Z9884 Bariatric surgery status: Secondary | ICD-10-CM | POA: Diagnosis not present

## 2016-03-28 DIAGNOSIS — J8410 Pulmonary fibrosis, unspecified: Secondary | ICD-10-CM | POA: Diagnosis not present

## 2016-03-28 DIAGNOSIS — Z79899 Other long term (current) drug therapy: Secondary | ICD-10-CM | POA: Diagnosis not present

## 2016-03-28 DIAGNOSIS — Z7982 Long term (current) use of aspirin: Secondary | ICD-10-CM | POA: Diagnosis not present

## 2016-03-28 DIAGNOSIS — M069 Rheumatoid arthritis, unspecified: Secondary | ICD-10-CM | POA: Diagnosis not present

## 2016-03-28 DIAGNOSIS — E119 Type 2 diabetes mellitus without complications: Secondary | ICD-10-CM | POA: Diagnosis not present

## 2016-03-28 DIAGNOSIS — J189 Pneumonia, unspecified organism: Secondary | ICD-10-CM | POA: Diagnosis not present

## 2016-03-28 DIAGNOSIS — E43 Unspecified severe protein-calorie malnutrition: Secondary | ICD-10-CM | POA: Diagnosis not present

## 2016-03-28 DIAGNOSIS — Z7984 Long term (current) use of oral hypoglycemic drugs: Secondary | ICD-10-CM | POA: Diagnosis not present

## 2016-03-29 DIAGNOSIS — E43 Unspecified severe protein-calorie malnutrition: Secondary | ICD-10-CM | POA: Diagnosis not present

## 2016-03-29 DIAGNOSIS — J189 Pneumonia, unspecified organism: Secondary | ICD-10-CM | POA: Diagnosis not present

## 2016-03-29 DIAGNOSIS — Z7984 Long term (current) use of oral hypoglycemic drugs: Secondary | ICD-10-CM | POA: Diagnosis not present

## 2016-03-29 DIAGNOSIS — M069 Rheumatoid arthritis, unspecified: Secondary | ICD-10-CM | POA: Diagnosis not present

## 2016-03-29 DIAGNOSIS — E119 Type 2 diabetes mellitus without complications: Secondary | ICD-10-CM | POA: Diagnosis not present

## 2016-03-29 DIAGNOSIS — D472 Monoclonal gammopathy: Secondary | ICD-10-CM | POA: Diagnosis not present

## 2016-03-29 DIAGNOSIS — J8410 Pulmonary fibrosis, unspecified: Secondary | ICD-10-CM | POA: Diagnosis not present

## 2016-03-29 DIAGNOSIS — Z7982 Long term (current) use of aspirin: Secondary | ICD-10-CM | POA: Diagnosis not present

## 2016-03-29 DIAGNOSIS — Z9884 Bariatric surgery status: Secondary | ICD-10-CM | POA: Diagnosis not present

## 2016-03-30 DIAGNOSIS — Z7982 Long term (current) use of aspirin: Secondary | ICD-10-CM | POA: Diagnosis not present

## 2016-03-30 DIAGNOSIS — J189 Pneumonia, unspecified organism: Secondary | ICD-10-CM | POA: Diagnosis not present

## 2016-03-30 DIAGNOSIS — D472 Monoclonal gammopathy: Secondary | ICD-10-CM | POA: Diagnosis not present

## 2016-03-30 DIAGNOSIS — M069 Rheumatoid arthritis, unspecified: Secondary | ICD-10-CM | POA: Diagnosis not present

## 2016-03-30 DIAGNOSIS — J8410 Pulmonary fibrosis, unspecified: Secondary | ICD-10-CM | POA: Diagnosis not present

## 2016-03-30 DIAGNOSIS — Z9884 Bariatric surgery status: Secondary | ICD-10-CM | POA: Diagnosis not present

## 2016-03-30 DIAGNOSIS — E119 Type 2 diabetes mellitus without complications: Secondary | ICD-10-CM | POA: Diagnosis not present

## 2016-03-30 DIAGNOSIS — E43 Unspecified severe protein-calorie malnutrition: Secondary | ICD-10-CM | POA: Diagnosis not present

## 2016-03-30 DIAGNOSIS — Z7984 Long term (current) use of oral hypoglycemic drugs: Secondary | ICD-10-CM | POA: Diagnosis not present

## 2016-04-03 ENCOUNTER — Emergency Department
Admission: EM | Admit: 2016-04-03 | Discharge: 2016-04-03 | Disposition: A | Discharge status: Home / Self Care | Payer: Commercial Managed Care - HMO | Attending: Student in an Organized Health Care Education/Training Program | Admitting: Student in an Organized Health Care Education/Training Program

## 2016-04-03 ENCOUNTER — Emergency Department: Payer: Commercial Managed Care - HMO

## 2016-04-03 ENCOUNTER — Encounter: Payer: Self-pay | Admitting: Family Medicine

## 2016-04-03 ENCOUNTER — Ambulatory Visit
Admission: RE | Admit: 2016-04-03 | Discharge: 2016-04-03 | Disposition: A | Discharge status: Home / Self Care | Payer: Commercial Managed Care - HMO | Source: Ambulatory Visit | Attending: Internal Medicine | Admitting: Internal Medicine

## 2016-04-03 ENCOUNTER — Encounter: Payer: Self-pay | Admitting: Emergency Medicine

## 2016-04-03 ENCOUNTER — Ambulatory Visit (INDEPENDENT_AMBULATORY_CARE_PROVIDER_SITE_OTHER): Payer: Commercial Managed Care - HMO | Admitting: Family Medicine

## 2016-04-03 DIAGNOSIS — E860 Dehydration: Secondary | ICD-10-CM

## 2016-04-03 DIAGNOSIS — Z8701 Personal history of pneumonia (recurrent): Secondary | ICD-10-CM

## 2016-04-03 DIAGNOSIS — D472 Monoclonal gammopathy: Secondary | ICD-10-CM | POA: Diagnosis not present

## 2016-04-03 DIAGNOSIS — J189 Pneumonia, unspecified organism: Secondary | ICD-10-CM | POA: Diagnosis not present

## 2016-04-03 DIAGNOSIS — R531 Weakness: Secondary | ICD-10-CM | POA: Diagnosis not present

## 2016-04-03 DIAGNOSIS — Z7982 Long term (current) use of aspirin: Secondary | ICD-10-CM | POA: Diagnosis not present

## 2016-04-03 DIAGNOSIS — J8410 Pulmonary fibrosis, unspecified: Secondary | ICD-10-CM

## 2016-04-03 DIAGNOSIS — Z9884 Bariatric surgery status: Secondary | ICD-10-CM | POA: Diagnosis not present

## 2016-04-03 DIAGNOSIS — J181 Lobar pneumonia, unspecified organism: Principal | ICD-10-CM

## 2016-04-03 DIAGNOSIS — E119 Type 2 diabetes mellitus without complications: Secondary | ICD-10-CM | POA: Diagnosis not present

## 2016-04-03 DIAGNOSIS — Z7984 Long term (current) use of oral hypoglycemic drugs: Secondary | ICD-10-CM | POA: Diagnosis not present

## 2016-04-03 DIAGNOSIS — I2510 Atherosclerotic heart disease of native coronary artery without angina pectoris: Secondary | ICD-10-CM | POA: Insufficient documentation

## 2016-04-03 DIAGNOSIS — E43 Unspecified severe protein-calorie malnutrition: Secondary | ICD-10-CM | POA: Diagnosis not present

## 2016-04-03 DIAGNOSIS — M069 Rheumatoid arthritis, unspecified: Secondary | ICD-10-CM | POA: Diagnosis not present

## 2016-04-03 DIAGNOSIS — Z09 Encounter for follow-up examination after completed treatment for conditions other than malignant neoplasm: Secondary | ICD-10-CM

## 2016-04-03 LAB — COMPREHENSIVE METABOLIC PANEL
ALT: 22 U/L (ref 14–54)
AST: 39 U/L (ref 15–41)
Albumin: 3.3 g/dL — ABNORMAL LOW (ref 3.5–5.0)
Alkaline Phosphatase: 104 U/L (ref 38–126)
Anion gap: 8 (ref 5–15)
BUN: 14 mg/dL (ref 6–20)
CHLORIDE: 100 mmol/L — AB (ref 101–111)
CO2: 27 mmol/L (ref 22–32)
Calcium: 9 mg/dL (ref 8.9–10.3)
Creatinine, Ser: 0.49 mg/dL (ref 0.44–1.00)
Glucose, Bld: 263 mg/dL — ABNORMAL HIGH (ref 65–99)
POTASSIUM: 4.1 mmol/L (ref 3.5–5.1)
Sodium: 135 mmol/L (ref 135–145)
Total Bilirubin: 0.4 mg/dL (ref 0.3–1.2)
Total Protein: 7.4 g/dL (ref 6.5–8.1)

## 2016-04-03 LAB — CBC
HCT: 40.4 % (ref 35.0–47.0)
Hemoglobin: 14.3 g/dL (ref 12.0–16.0)
MCH: 34.8 pg — AB (ref 26.0–34.0)
MCHC: 35.3 g/dL (ref 32.0–36.0)
MCV: 98.6 fL (ref 80.0–100.0)
PLATELETS: 308 10*3/uL (ref 150–440)
RBC: 4.1 MIL/uL (ref 3.80–5.20)
RDW: 16.8 % — ABNORMAL HIGH (ref 11.5–14.5)
WBC: 10.9 10*3/uL (ref 3.6–11.0)

## 2016-04-03 MED ORDER — ALBUTEROL SULFATE (2.5 MG/3ML) 0.083% IN NEBU
2.5000 mg | INHALATION_SOLUTION | Freq: Once | RESPIRATORY_TRACT | Status: AC
  Administered 2016-04-03: 2.5 mg via RESPIRATORY_TRACT
  Filled 2016-04-03: qty 3

## 2016-04-03 MED ORDER — SODIUM CHLORIDE 0.9 % IV BOLUS (SEPSIS)
500.0000 mL | Freq: Once | INTRAVENOUS | Status: AC
  Administered 2016-04-03: 500 mL via INTRAVENOUS

## 2016-04-03 MED ORDER — ALBUMIN HUMAN 25 % IV SOLN
12.5000 g | Freq: Once | INTRAVENOUS | Status: AC
  Administered 2016-04-03: 12.5 g via INTRAVENOUS
  Filled 2016-04-03: qty 50

## 2016-04-03 NOTE — ED Notes (Signed)
Pt able to ambulate w/ resp distress. SPO2 >95%

## 2016-04-03 NOTE — ED Notes (Signed)
Pt. returned from XR. 

## 2016-04-03 NOTE — ED Provider Notes (Signed)
[facility] Emergency Department Provider Note    First MD Initiated Contact with Patient 04/03/16 1719     (approximate)  I have reviewed the triage vital signs and the nursing notes.   HISTORY  Chief Complaint Pneumonia and Dehydration    HPI Anita Atkins is a 81 y.o. female history of pulmonary fibrosis as well as rheumatoid arthritis status post admission for pneumonia presents with chief complaint of dehydration and persistent dyspnea. Patient completed 20 day course of antibiotics but still feels dehydrated and weak. Denies any chest pain. Denies any productive sputum at this time. She does not wear any home oxygen and she states that she's never qualified for home O2 but does feel much better with oxygen on. She was seen at her PCPs office today where labs were drawn and a chest x-ray was performed but due to her frail. She was sent to the emergency department for further evaluation and management.  She is also status post CABG but denies any weight gain, lower extremity edema, orthopnea or chest pain. She states that she's been taking her medications as directed.   Past Medical History:  Diagnosis Date  . Diabetes mellitus without complication ([place])   . High cholesterol   . Monoclonal paraproteinemia 01/31/2016  . Pulmonary fibrosis ([place])   . RA (rheumatoid arthritis) ([place])   . Shingles   . Status post bariatric surgery 02/13/2016    Patient Active Problem List   Diagnosis Date Noted  . Right lower lobe pneumonia 03/12/2016  . Pneumonia 03/06/2016  . Protein-calorie malnutrition, severe 03/06/2016  . Status post bariatric surgery 02/13/2016  . Monoclonal paraproteinemia 01/31/2016  . Elevated sedimentation rate 08/15/2015  . History of ITP 07/18/2015  . Abnormal SPEP 07/18/2015  . Diabetic neuropathy ([place]) 07/13/2015  . Macrocytosis 07/13/2015  . Depression, major, single episode, mild ([place]) 07/13/2015  . Chronic diarrhea 07/13/2015  .  Cataracts, bilateral 06/20/2015  . Hyperkalemia 06/17/2015  . Cough, persistent 06/15/2015  . Rheumatoid arthritis ([place]) 06/15/2015  . Osteoporosis 06/15/2015  . Pulmonary fibrosis ([place]) 06/15/2015  . Type 2 diabetes mellitus without complication, without long-term current use of insulin ([place]) 06/15/2015  . Coronary artery disease involving native coronary artery of native heart without angina pectoris 06/15/2015  . Vitamin D deficiency 06/15/2015  . High blood cholesterol 06/15/2015  . Fatigue 06/15/2015  . Bariatric surgery status 06/15/2015  . Needs flu shot 06/15/2015  . Need for prophylactic vaccination with Streptococcus pneumoniae (Pneumococcus) and Influenza vaccines 06/15/2015    Past Surgical History:  Procedure Laterality Date  . CARDIAC SURGERY    . CHOLECYSTECTOMY    . EXPLORATION POST OPERATIVE OPEN HEART    . GASTRIC BYPASS  2007    Prior to Admission medications   Medication Sig Start Date End Date Taking? Authorizing Provider  acetaminophen (TYLENOL) 500 MG tablet Take 500 mg by mouth every 6 (six) hours as needed.     Historical Provider, MD  alendronate (FOSAMAX) 70 MG tablet Take 1 tablet by mouth once a week. 11/11/15   Historical Provider, MD  aspirin EC 81 MG tablet Take 81 mg by mouth daily.    Historical Provider, MD  azaTHIOprine (IMURAN) 50 MG tablet Take 50 mg by mouth daily. 03/21/16   Historical Provider, MD  azelastine (ASTELIN) 0.1 % nasal spray Place 1 spray into both nostrils 2 (two) times daily as needed.  03/16/15   Historical Provider, MD  benzonatate (TESSALON) 100 MG capsule Take 100 mg by  mouth every 8 (eight) hours as needed for cough. Reported on 03/16/2016    Historical Provider, MD  feeding supplement (BOOST / RESOURCE BREEZE) LIQD Take 1 Container by mouth 3 (three) times daily between meals. 03/08/16   Vaughan Basta, MD  glucose blood (ACCU-CHEK AVIVA) test strip Use as instructed to check sugars once a day; E11.9, LON 99 months  03/13/16   Arnetha Courser, MD  magic mouthwash SOLN Take 5 mLs by mouth 4 (four) times daily as needed for mouth pain. 03/12/16   Arnetha Courser, MD  mirtazapine (REMERON) 15 MG tablet Take 1 tablet (15 mg total) by mouth at bedtime. 02/13/16   Arnetha Courser, MD  OFEV 150 MG CAPS Take 150 mg by mouth 2 (two) times daily.  07/25/15   Historical Provider, MD  pantoprazole (PROTONIX) 20 MG tablet Take 1 tablet (20 mg total) by mouth daily. 12/07/15 12/06/16  Flora Lipps, MD  predniSONE (DELTASONE) 5 MG tablet Take 5 mg by mouth daily with breakfast. Take 1 to 1.5 by mouth daily for joint pain    Historical Provider, MD  simvastatin (ZOCOR) 40 MG tablet Take 1 tablet (40 mg total) by mouth at bedtime. 08/17/15   Minna Merritts, MD  SYMBICORT 160-4.5 MCG/ACT inhaler Inhale 2 puffs into the lungs 2 (two) times daily.  10/20/15   Historical Provider, MD  traMADol (ULTRAM) 50 MG tablet Take 1 tablet (50 mg total) by mouth every 12 (twelve) hours as needed for moderate pain or severe pain. 03/08/16   Vaughan Basta, MD  VENTOLIN HFA 108 (90 Base) MCG/ACT inhaler Inhale 2 puffs into the lungs every 6 (six) hours as needed.  10/20/15   Historical Provider, MD    Allergies Plaquenil [hydroxychloroquine sulfate]  Family History  Problem Relation Age of Onset  . Heart disease Mother   . Stroke Mother   . Diabetes Sister   . Hyperlipidemia Sister   . Cancer Maternal Aunt     female  . COPD Neg Hx   . Hypertension Neg Hx     Social History Social History  Substance Use Topics  . Smoking status: Never Smoker  . Smokeless tobacco: Never Used  . Alcohol use No    Review of Systems Patient denies headaches, rhinorrhea, blurry vision, numbness, shortness of breath, chest pain, edema, cough, abdominal pain, nausea, vomiting, diarrhea, dysuria, fevers, rashes or hallucinations unless otherwise stated above in HPI. ____________________________________________   PHYSICAL EXAM:  VITAL  SIGNS: Vitals:   04/03/16 1723 04/03/16 1724  BP: (!) 150/91   Pulse:  99  Resp:    Temp:      Constitutional: Alert and oriented. Frail and cachectic appearing no respiratory distress. Eyes: Conjunctivae are normal. PERRL. EOMI. Head: Atraumatic. Nose: No congestion/rhinnorhea. Mouth/Throat: Mucous membranes are moist.  Oropharynx non-erythematous. Neck: No stridor. Painless ROM. No cervical spine tenderness to palpation Hematological/Lymphatic/Immunilogical: No cervical lymphadenopathy. Cardiovascular: Normal rate, regular rhythm. Grossly normal heart sounds.  Good peripheral circulation. Respiratory: Normal respiratory effort.  No retractions. Diffuse inspiratory crackles and posterior and anterior lung fields Gastrointestinal: Soft and nontender. No distention. No abdominal bruits. No CVA tenderness. Genitourinary:  Musculoskeletal: No lower extremity tenderness nor edema.  No joint effusions. Neurologic:  Normal speech and language. No gross focal neurologic deficits are appreciated. No gait instability. Skin:  Skin is warm, dry and intact. Skin tenting noted Psychiatric: Mood and affect are normal. Speech and behavior are normal.  ____________________________________________   LABS (all labs ordered are  listed, but only abnormal results are displayed)  Results for orders placed or performed during the hospital encounter of 04/03/16 (from the past 24 hour(s))  Comprehensive metabolic panel     Status: Abnormal   Collection Time: 04/03/16  2:54 PM  Result Value Ref Range   Sodium 135 135 - 145 mmol/L   Potassium 4.1 3.5 - 5.1 mmol/L   Chloride 100 (L) 101 - 111 mmol/L   CO2 27 22 - 32 mmol/L   Glucose, Bld 263 (H) 65 - 99 mg/dL   BUN 14 6 - 20 mg/dL   Creatinine, Ser 0.49 0.44 - 1.00 mg/dL   Calcium 9.0 8.9 - 10.3 mg/dL   Total Protein 7.4 6.5 - 8.1 g/dL   Albumin 3.3 (L) 3.5 - 5.0 g/dL   AST 39 15 - 41 U/L   ALT 22 14 - 54 U/L   Alkaline Phosphatase 104 38 - 126 U/L    Total Bilirubin 0.4 0.3 - 1.2 mg/dL   GFR calc non Af Amer >60 >60 mL/min   GFR calc Af Amer >60 >60 mL/min   Anion gap 8 5 - 15  CBC     Status: Abnormal   Collection Time: 04/03/16  2:54 PM  Result Value Ref Range   WBC 10.9 3.6 - 11.0 K/uL   RBC 4.10 3.80 - 5.20 MIL/uL   Hemoglobin 14.3 12.0 - 16.0 g/dL   HCT 40.4 35.0 - 47.0 %   MCV 98.6 80.0 - 100.0 fL   MCH 34.8 (H) 26.0 - 34.0 pg   MCHC 35.3 32.0 - 36.0 g/dL   RDW 16.8 (H) 11.5 - 14.5 %   Platelets 308 150 - 440 K/uL   ____________________________________________   ____________________________________________  RADIOLOGY  reviewed ____________________________________________   PROCEDURES  Procedure(s) performed: none    Critical Care performed: no ____________________________________________   INITIAL IMPRESSION / ASSESSMENT AND PLAN / ED COURSE  Pertinent labs & imaging results that were available during my care of the patient were reviewed by me and considered in my medical decision making (see chart for details).  DDX: Pulmonary fibrosis, dehydration, pneumonia, ACS, heart failure, anemia  Anita Atkins is a 81 y.o. who presents to the ED with concern for dehydration and weakness status post recent stent treatment for pneumonia. Patient arrives afebrile and without acute hypoxia. The patient does have a history of fibrosis as well as rheumatoid arthritis on immunosuppressive therapy. Patient does have symptomatically improvement with nasal cannula but is not hypoxic at rest. She denies any chest pain or pressure. She has no physical findings to suggest congestive heart failure. Chest x-ray ordered at her PCPs office shows no new infiltrate. Only chronic fibrotic changes. No evidence of congestive heart failure. Patient provided with IV fluids with improvement in symptoms. Patient was able to ambulate with a steady gait and she did not have any desaturations while ambulating. Do not feel the patient is in need of  restarting antibiotics at this time. No evidence of acute infectious process or renal dysfunction.. Patient stable for outpatient follow-up.  Have discussed with the patient and available family all diagnostics and treatments performed thus far and all questions were answered to the best of my ability. The patient demonstrates understanding and agreement with plan.   Clinical Course     ____________________________________________   FINAL CLINICAL IMPRESSION(S) / ED DIAGNOSES  Final diagnoses:  Dehydration  Weakness      NEW MEDICATIONS STARTED DURING THIS VISIT:  Discharge Medication List as of  04/03/2016  8:35 PM       Note:  This document was prepared using Dragon voice recognition software and may include unintentional dictation errors.      Merlyn Lot, MD 04/03/16 2137

## 2016-04-03 NOTE — Patient Instructions (Signed)
It sounds like you have orthostatic hypotension and may need IV fluids You can go to the ER and have them evaluate you and contact Dr. Mortimer Fries if needed based on your chest xray findings Your calcium level was low, and it may be reflective of your low albumin Also your hemoglobin had dropped in the hospital, so you have anemia

## 2016-04-03 NOTE — ED Triage Notes (Signed)
Pt presents with daughter from PCP for questionable lung sounds and possible dehydration. Pt was seen for f/u Pneumonia; had chest xray done today.

## 2016-04-03 NOTE — Progress Notes (Signed)
BP 132/80   Pulse 99   Temp 97.6 F (36.4 C) (Oral)   Resp 16   Wt 90 lb 8 oz (41.1 kg)   SpO2 94%   BMI 16.82 kg/m    Subjective:    Patient ID: Anita Atkins, female    DOB: December 11, 1934, 81 y.o.   MRN: [id number]  HPI: Anita Atkins is a 81 y.o. female  Chief Complaint  Patient presents with  . Follow-up   She feels lousy, can't catch her breath; nurse came out this morning She had a big drop in the bottom BP number, dropped to 58; heart rate sped up, 117 when he checked; he said she was orthostatic Coughed up some stuff and it was white and thick; she does feel short of breath Energy is pretty low; not eating well, no appetite, remeron is not working; actually got in 1400 calories yesterday, but was sick the day before that; just had stomach issues; felt lousy Sugars are doing well  Relevant past medical, surgical, family and social history reviewed Past Medical History:  Diagnosis Date  . Diabetes mellitus without complication ([place])   . High cholesterol   . Monoclonal paraproteinemia 01/31/2016  . Pulmonary fibrosis ([place])   . RA (rheumatoid arthritis) ([place])   . Shingles   . Status post bariatric surgery 02/13/2016   Past Surgical History:  Procedure Laterality Date  . CARDIAC SURGERY    . CHOLECYSTECTOMY    . EXPLORATION POST OPERATIVE OPEN HEART    . GASTRIC BYPASS  2007   Social History  Substance Use Topics  . Smoking status: Never Smoker  . Smokeless tobacco: Never Used  . Alcohol use No   Interim medical history since last visit reviewed. Allergies and medications reviewed  Review of Systems Per HPI unless specifically indicated above     Objective:    BP 132/80   Pulse 99   Temp 97.6 F (36.4 C) (Oral)   Resp 16   Wt 90 lb 8 oz (41.1 kg)   SpO2 94%   BMI 16.82 kg/m   Wt Readings from Last 3 Encounters:  04/03/16 90 lb (40.8 kg)  04/03/16 90 lb 8 oz (41.1 kg)  03/20/16 91 lb 12 oz (41.6 kg)    Physical Exam  Constitutional: No distress (but  appears weak, frail, ill).  Weak and frail, appears chronically ill  HENT:  Head: Normocephalic and atraumatic.  Cardiovascular: Regular rhythm.   Borderline tachycardia  Pulmonary/Chest: Effort normal. No respiratory distress.  Loud crackles at left lateral base  Abdominal: She exhibits no distension.  Skin: No pallor.  Psychiatric: She has a normal mood and affect.    Results for orders placed or performed during the hospital encounter of 03/06/16  Blood Culture (routine x 2)  Result Value Ref Range   Specimen Description BLOOD LEFT ASSIST CONTROL    Special Requests BOTTLES DRAWN AEROBIC AND ANAEROBIC  [place]    Culture NO GROWTH 5 DAYS    Report Status 03/11/2016 FINAL   Blood Culture (routine x 2)  Result Value Ref Range   Specimen Description BLOOD RIGHT ASSIST CONTROL    Special Requests BOTTLES DRAWN AEROBIC AND ANAEROBIC  [place]    Culture NO GROWTH 5 DAYS    Report Status 03/11/2016 FINAL   Urine culture  Result Value Ref Range   Specimen Description URINE, RANDOM    Special Requests NONE    Culture NO GROWTH Performed at [facility]  Report Status 03/07/2016 FINAL   CBC  Result Value Ref Range   WBC 34.4 (H) 3.6 - 11.0 K/uL   RBC 4.07 3.80 - 5.20 MIL/uL   Hemoglobin 13.6 12.0 - 16.0 g/dL   HCT 40.0 35.0 - 47.0 %   MCV 98.3 80.0 - 100.0 fL   MCH 33.4 26.0 - 34.0 pg   MCHC 34.0 32.0 - 36.0 g/dL   RDW 14.8 (H) 11.5 - 14.5 %   Platelets 413 150 - 440 K/uL  Comprehensive metabolic panel  Result Value Ref Range   Sodium 130 (L) 135 - 145 mmol/L   Potassium 4.1 3.5 - 5.1 mmol/L   Chloride 95 (L) 101 - 111 mmol/L   CO2 22 22 - 32 mmol/L   Glucose, Bld 161 (H) 65 - 99 mg/dL   BUN 17 6 - 20 mg/dL   Creatinine, Ser 0.68 0.44 - 1.00 mg/dL   Calcium 8.2 (L) 8.9 - 10.3 mg/dL   Total Protein 7.5 6.5 - 8.1 g/dL   Albumin 2.5 (L) 3.5 - 5.0 g/dL   AST 24 15 - 41 U/L   ALT 15 14 - 54 U/L   Alkaline Phosphatase 121 38 - 126 U/L   Total Bilirubin 1.2 0.3 - 1.2  mg/dL   GFR calc non Af Amer >60 >60 mL/min   GFR calc Af Amer >60 >60 mL/min   Anion gap 13 5 - 15  Lipase, blood  Result Value Ref Range   Lipase 13 11 - 51 U/L  Urinalysis complete, with microscopic ([facility] only)  Result Value Ref Range   Color, Urine YELLOW (A) YELLOW   APPearance CLEAR (A) CLEAR   Glucose, UA 150 (A) NEGATIVE mg/dL   Bilirubin Urine NEGATIVE NEGATIVE   Ketones, ur 1+ (A) NEGATIVE mg/dL   Specific Gravity, Urine 1.014 1.005 - 1.030   Hgb urine dipstick NEGATIVE NEGATIVE   pH 6.0 5.0 - 8.0   Protein, ur NEGATIVE NEGATIVE mg/dL   Nitrite NEGATIVE NEGATIVE   Leukocytes, UA NEGATIVE NEGATIVE   RBC / HPF NONE SEEN 0 - 5 RBC/hpf   WBC, UA 0-5 0 - 5 WBC/hpf   Bacteria, UA NONE SEEN NONE SEEN   Squamous Epithelial / LPF 0-5 (A) NONE SEEN   Mucous PRESENT   Lactic acid, plasma  Result Value Ref Range   Lactic Acid, Venous 1.5 0.5 - 1.9 mmol/L  Lactic acid, plasma  Result Value Ref Range   Lactic Acid, Venous 1.4 0.5 - 1.9 mmol/L  Basic metabolic panel  Result Value Ref Range   Sodium 134 (L) 135 - 145 mmol/L   Potassium 3.5 3.5 - 5.1 mmol/L   Chloride 104 101 - 111 mmol/L   CO2 25 22 - 32 mmol/L   Glucose, Bld 261 (H) 65 - 99 mg/dL   BUN 14 6 - 20 mg/dL   Creatinine, Ser 0.48 0.44 - 1.00 mg/dL   Calcium 7.6 (L) 8.9 - 10.3 mg/dL   GFR calc non Af Amer >60 >60 mL/min   GFR calc Af Amer >60 >60 mL/min   Anion gap 5 5 - 15  CBC  Result Value Ref Range   WBC 19.5 (H) 3.6 - 11.0 K/uL   RBC 3.31 (L) 3.80 - 5.20 MIL/uL   Hemoglobin 11.1 (L) 12.0 - 16.0 g/dL   HCT 32.6 (L) 35.0 - 47.0 %   MCV 98.4 80.0 - 100.0 fL   MCH 33.6 26.0 - 34.0 pg   MCHC 34.1 32.0 -  36.0 g/dL   RDW 14.7 (H) 11.5 - 14.5 %   Platelets 352 150 - 440 K/uL  Glucose, capillary  Result Value Ref Range   Glucose-Capillary 305 (H) 65 - 99 mg/dL  Glucose, capillary  Result Value Ref Range   Glucose-Capillary 243 (H) 65 - 99 mg/dL  CBC  Result Value Ref Range   WBC 9.3 3.6 - 11.0 K/uL    RBC 3.27 (L) 3.80 - 5.20 MIL/uL   Hemoglobin 11.3 (L) 12.0 - 16.0 g/dL   HCT 31.6 (L) 35.0 - 47.0 %   MCV 96.5 80.0 - 100.0 fL   MCH 34.5 (H) 26.0 - 34.0 pg   MCHC 35.7 32.0 - 36.0 g/dL   RDW 14.9 (H) 11.5 - 14.5 %   Platelets 428 150 - 440 K/uL  Glucose, capillary  Result Value Ref Range   Glucose-Capillary 144 (H) 65 - 99 mg/dL  Glucose, capillary  Result Value Ref Range   Glucose-Capillary 143 (H) 65 - 99 mg/dL   Comment 1 Notify RN    Comment 2 Document in Chart      Assessment & Plan:   Problem List Items Addressed This Visit      Respiratory   Right lower lobe pneumonia    Noted in July 2017; had repeat CXR but I do not have those results, ordered by another physical; abnormal breath sounds at right base laterally; immunosuppressed; concerning for incompletely treated pneumonia; sending to ER        Other   Weakness    Patient appears weak; was orthostatic this morning during home health visit; has adventitious breath sounds at site of prior pneumonia; sending to ER for evaluation, IVF if they feel indicated       Other Visit Diagnoses   None.     Follow up plan: No Follow-up on file.  An after-visit summary was printed and given to the patient at [place].  Please see the patient instructions which may contain other information and recommendations beyond what is mentioned above in the assessment and plan.  Meds ordered this encounter  Medications  . azaTHIOprine (IMURAN) 50 MG tablet    Sig: Take 50 mg by mouth daily.    No orders of the defined types were placed in this encounter.

## 2016-04-04 DIAGNOSIS — Z7982 Long term (current) use of aspirin: Secondary | ICD-10-CM | POA: Diagnosis not present

## 2016-04-04 DIAGNOSIS — J8410 Pulmonary fibrosis, unspecified: Secondary | ICD-10-CM | POA: Diagnosis not present

## 2016-04-04 DIAGNOSIS — Z7984 Long term (current) use of oral hypoglycemic drugs: Secondary | ICD-10-CM | POA: Diagnosis not present

## 2016-04-04 DIAGNOSIS — E119 Type 2 diabetes mellitus without complications: Secondary | ICD-10-CM | POA: Diagnosis not present

## 2016-04-04 DIAGNOSIS — I2510 Atherosclerotic heart disease of native coronary artery without angina pectoris: Secondary | ICD-10-CM | POA: Diagnosis not present

## 2016-04-04 DIAGNOSIS — M069 Rheumatoid arthritis, unspecified: Secondary | ICD-10-CM | POA: Diagnosis not present

## 2016-04-04 DIAGNOSIS — Z9884 Bariatric surgery status: Secondary | ICD-10-CM | POA: Diagnosis not present

## 2016-04-04 DIAGNOSIS — D472 Monoclonal gammopathy: Secondary | ICD-10-CM | POA: Diagnosis not present

## 2016-04-04 DIAGNOSIS — J189 Pneumonia, unspecified organism: Secondary | ICD-10-CM | POA: Diagnosis not present

## 2016-04-04 DIAGNOSIS — R531 Weakness: Secondary | ICD-10-CM | POA: Diagnosis not present

## 2016-04-04 DIAGNOSIS — E43 Unspecified severe protein-calorie malnutrition: Secondary | ICD-10-CM | POA: Diagnosis not present

## 2016-04-04 DIAGNOSIS — R269 Unspecified abnormalities of gait and mobility: Secondary | ICD-10-CM | POA: Diagnosis not present

## 2016-04-06 DIAGNOSIS — Z9884 Bariatric surgery status: Secondary | ICD-10-CM | POA: Diagnosis not present

## 2016-04-06 DIAGNOSIS — J8410 Pulmonary fibrosis, unspecified: Secondary | ICD-10-CM | POA: Diagnosis not present

## 2016-04-06 DIAGNOSIS — E119 Type 2 diabetes mellitus without complications: Secondary | ICD-10-CM | POA: Diagnosis not present

## 2016-04-06 DIAGNOSIS — M069 Rheumatoid arthritis, unspecified: Secondary | ICD-10-CM | POA: Diagnosis not present

## 2016-04-06 DIAGNOSIS — Z7984 Long term (current) use of oral hypoglycemic drugs: Secondary | ICD-10-CM | POA: Diagnosis not present

## 2016-04-06 DIAGNOSIS — J189 Pneumonia, unspecified organism: Secondary | ICD-10-CM | POA: Diagnosis not present

## 2016-04-06 DIAGNOSIS — E43 Unspecified severe protein-calorie malnutrition: Secondary | ICD-10-CM | POA: Diagnosis not present

## 2016-04-06 DIAGNOSIS — D472 Monoclonal gammopathy: Secondary | ICD-10-CM | POA: Diagnosis not present

## 2016-04-06 DIAGNOSIS — R531 Weakness: Secondary | ICD-10-CM | POA: Insufficient documentation

## 2016-04-06 DIAGNOSIS — Z7982 Long term (current) use of aspirin: Secondary | ICD-10-CM | POA: Diagnosis not present

## 2016-04-06 NOTE — Assessment & Plan Note (Signed)
Noted in July 2017; had repeat CXR but I do not have those results, ordered by another physical; abnormal breath sounds at right base laterally; immunosuppressed; concerning for incompletely treated pneumonia; sending to ER

## 2016-04-06 NOTE — Assessment & Plan Note (Signed)
Patient appears weak; was orthostatic this morning during home health visit; has adventitious breath sounds at site of prior pneumonia; sending to ER for evaluation, IVF if they feel indicated

## 2016-04-10 DIAGNOSIS — Z7984 Long term (current) use of oral hypoglycemic drugs: Secondary | ICD-10-CM | POA: Diagnosis not present

## 2016-04-10 DIAGNOSIS — Z9884 Bariatric surgery status: Secondary | ICD-10-CM | POA: Diagnosis not present

## 2016-04-10 DIAGNOSIS — E43 Unspecified severe protein-calorie malnutrition: Secondary | ICD-10-CM | POA: Diagnosis not present

## 2016-04-10 DIAGNOSIS — Z7982 Long term (current) use of aspirin: Secondary | ICD-10-CM | POA: Diagnosis not present

## 2016-04-10 DIAGNOSIS — E119 Type 2 diabetes mellitus without complications: Secondary | ICD-10-CM | POA: Diagnosis not present

## 2016-04-10 DIAGNOSIS — J8410 Pulmonary fibrosis, unspecified: Secondary | ICD-10-CM | POA: Diagnosis not present

## 2016-04-10 DIAGNOSIS — D472 Monoclonal gammopathy: Secondary | ICD-10-CM | POA: Diagnosis not present

## 2016-04-10 DIAGNOSIS — J189 Pneumonia, unspecified organism: Secondary | ICD-10-CM | POA: Diagnosis not present

## 2016-04-10 DIAGNOSIS — M069 Rheumatoid arthritis, unspecified: Secondary | ICD-10-CM | POA: Diagnosis not present

## 2016-04-11 DIAGNOSIS — E43 Unspecified severe protein-calorie malnutrition: Secondary | ICD-10-CM | POA: Diagnosis not present

## 2016-04-11 DIAGNOSIS — D472 Monoclonal gammopathy: Secondary | ICD-10-CM | POA: Diagnosis not present

## 2016-04-11 DIAGNOSIS — Z9884 Bariatric surgery status: Secondary | ICD-10-CM | POA: Diagnosis not present

## 2016-04-11 DIAGNOSIS — J189 Pneumonia, unspecified organism: Secondary | ICD-10-CM | POA: Diagnosis not present

## 2016-04-11 DIAGNOSIS — M069 Rheumatoid arthritis, unspecified: Secondary | ICD-10-CM | POA: Diagnosis not present

## 2016-04-11 DIAGNOSIS — Z7984 Long term (current) use of oral hypoglycemic drugs: Secondary | ICD-10-CM | POA: Diagnosis not present

## 2016-04-11 DIAGNOSIS — J8410 Pulmonary fibrosis, unspecified: Secondary | ICD-10-CM | POA: Diagnosis not present

## 2016-04-11 DIAGNOSIS — Z7982 Long term (current) use of aspirin: Secondary | ICD-10-CM | POA: Diagnosis not present

## 2016-04-11 DIAGNOSIS — E119 Type 2 diabetes mellitus without complications: Secondary | ICD-10-CM | POA: Diagnosis not present

## 2016-04-12 ENCOUNTER — Encounter: Payer: Self-pay | Admitting: Internal Medicine

## 2016-04-12 ENCOUNTER — Ambulatory Visit (INDEPENDENT_AMBULATORY_CARE_PROVIDER_SITE_OTHER): Payer: Commercial Managed Care - HMO | Admitting: Internal Medicine

## 2016-04-12 VITALS — BP 108/60 | HR 102 | Wt 89.0 lb

## 2016-04-12 DIAGNOSIS — J189 Pneumonia, unspecified organism: Secondary | ICD-10-CM | POA: Diagnosis not present

## 2016-04-12 DIAGNOSIS — J8410 Pulmonary fibrosis, unspecified: Secondary | ICD-10-CM

## 2016-04-12 MED ORDER — GUAIFENESIN-CODEINE 100-10 MG/5ML PO SYRP: 5.0000 mL | ORAL_SOLUTION | Freq: Three times a day (TID) | ORAL | 0 refills | Status: DC | PRN

## 2016-04-12 MED ORDER — SYMBICORT 160-4.5 MCG/ACT IN AERO: 2.0000 | INHALATION_SPRAY | Freq: Two times a day (BID) | RESPIRATORY_TRACT | 5 refills | Status: AC

## 2016-04-12 MED ORDER — VENTOLIN HFA 108 (90 BASE) MCG/ACT IN AERS: 2.0000 | INHALATION_SPRAY | Freq: Four times a day (QID) | RESPIRATORY_TRACT | 2 refills | Status: AC | PRN

## 2016-04-12 NOTE — Progress Notes (Signed)
[facility] Consultation      Date: 04/12/2016,   MRN# [id number] Anita Atkins 1934/09/07 Code Status:  Hosp day:@LENGTHOFSTAYDAYS @ Referring MD: @ATDPROV @     PCP:      Admission                  Current  Anita Atkins is a 81 y.o. old female seen in consultation for pulmonary fibrosis     CHIEF COMPLAINT:  Follow Chronic SOB and WOB, follow up pneumonia H/o pulmonary fibrosis    HISTORY OF PRESENT ILLNESS  81 yo white female follow up for for previous dx of pulm fibrosis apporx 10-15 years ago Recent dx of RLL pneumonia-cxr shows improvement-however patient still with pleuritic chest pain Feels very tired all the time very fatigued Her symptoms are SOB and DOE chronic, associated with weakness and palpitations with exertion, Patient risk factors includes heavy second hand smoke exposure,  Has re-started  OFEV 1  Patient has no fevers, chills at this time, no evidence of infection Patient sitting up and breathing comfortably, looks ill   6MWT 1000 feet no oxygen desats PFT 07/08/15 Ratio 92%  FVC 2.3 L 51% TLC 4.6 58% DLCO 48%  ONO 06/2015 negative       Current Medication:  Current Outpatient Prescriptions:  .  acetaminophen (TYLENOL) 500 MG tablet, Take 500 mg by mouth every 6 (six) hours as needed. , Disp: , Rfl:  .  alendronate (FOSAMAX) 70 MG tablet, Take 1 tablet by mouth once a week., Disp: , Rfl:  .  aspirin EC 81 MG tablet, Take 81 mg by mouth daily., Disp: , Rfl:  .  azaTHIOprine (IMURAN) 50 MG tablet, Take 50 mg by mouth daily., Disp: , Rfl:  .  azelastine (ASTELIN) 0.1 % nasal spray, Place 1 spray into both nostrils 2 (two) times daily as needed. , Disp: , Rfl:  .  benzonatate (TESSALON) 100 MG capsule, Take 100 mg by mouth every 8 (eight) hours as needed for cough. Reported on 03/16/2016, Disp: , Rfl:  .  feeding supplement (BOOST / RESOURCE BREEZE) LIQD, Take 1 Container by mouth 3 (three) times daily between meals., Disp: 237  mL, Rfl: 0 .  glucose blood (ACCU-CHEK AVIVA) test strip, Use as instructed to check sugars once a day; E11.9, LON 99 months, Disp: 100 each, Rfl: 1 .  magic mouthwash SOLN, Take 5 mLs by mouth 4 (four) times daily as needed for mouth pain., Disp: 280 mL, Rfl: 0 .  mirtazapine (REMERON) 15 MG tablet, Take 1 tablet (15 mg total) by mouth at bedtime., Disp: 90 tablet, Rfl: 1 .  OFEV 150 MG CAPS, Take 150 mg by mouth 2 (two) times daily. , Disp: , Rfl:  .  pantoprazole (PROTONIX) 20 MG tablet, Take 1 tablet (20 mg total) by mouth daily., Disp: 90 tablet, Rfl: 8 .  predniSONE (DELTASONE) 5 MG tablet, Take 5 mg by mouth daily with breakfast. Take 1 to 1.5 by mouth daily for joint pain, Disp: , Rfl:  .  simvastatin (ZOCOR) 40 MG tablet, Take 1 tablet (40 mg total) by mouth at bedtime., Disp: 90 tablet, Rfl: 3 .  SYMBICORT 160-4.5 MCG/ACT inhaler, Inhale 2 puffs into the lungs 2 (two) times daily. , Disp: , Rfl:  .  traMADol (ULTRAM) 50 MG tablet, Take 1 tablet (50 mg total) by mouth every 12 (twelve) hours as needed for moderate pain or severe pain., Disp: 20 tablet, Rfl: 0 .  VENTOLIN HFA 108 (  90 Base) MCG/ACT inhaler, Inhale 2 puffs into the lungs every 6 (six) hours as needed. , Disp: , Rfl:     ALLERGIES   Plaquenil [hydroxychloroquine sulfate]     REVIEW OF SYSTEMS   Review of Systems  Constitutional: Negative for chills, fever, malaise/fatigue and weight loss.  HENT: Negative for congestion.   Eyes: Negative for blurred vision and double vision.  Respiratory: Positive for shortness of breath. Negative for cough.   Cardiovascular: Negative for chest pain, palpitations and leg swelling.  Gastrointestinal: Negative for heartburn.  Genitourinary: Negative.   Skin: Negative for rash.  Neurological: Negative for dizziness, weakness and headaches.  All other systems reviewed and are negative.    VS:BP 108/60 (BP Location: Right Arm, Cuff Size: Normal)   Pulse (!) 102   Wt 89 lb (40.4  kg)   SpO2 98%   BMI 16.82 kg/m     PHYSICAL EXAM  Physical Exam  Constitutional: She is oriented to person, place, and time. No distress.  Thin and cachectic  Eyes: EOM are normal.  Cardiovascular: Normal rate, regular rhythm and normal heart sounds.   No murmur heard. Pulmonary/Chest: No stridor. No respiratory distress. She has no wheezes. She has rales.  Musculoskeletal: Normal range of motion. She exhibits no edema.  Neurological: She is alert and oriented to person, place, and time.  Skin: She is not diaphoretic.  Psychiatric: She has a normal mood and affect.         CXR on 03/12/16 Images reviewed on 04/12/2016 RLL opacity seen   CXR 8/7 Images reviewed 04/12/2016 Previous RLL opacity has resolved      ASSESSMENT/PLAN   81 yo very pleasant white female with Pulmonary Fibrosis(no biopsy) for many years with chronic SOB and DOE with chronic cough and intermittent chest tightness with recent RLL pneumonia, now with persistent pleuritic chest pain and fatigue  1.continue inhaled symbicort, albuterol as needed 2.contiue PPI 3..continue cough suppressant as needed 4..follow up with cardiology as needed with dr Rockey Situ as needed 5.follow up GI consults for h/o gastric bypass as needed 6.continue OFEV and prednisone as prescribed 7.will obtain CT chest to assess lung fields and assess for lung mass 8.check PFT and 6MWT  9.check ONO   Follow up in 4 weeks after Tests completed. Will discuss end of life goals of care at next OV, prognosis is very poor.    The Patient requires high complexity decision making for assessment and support, frequent evaluation and titration of therapies, application of advanced monitoring technologies and extensive interpretation of multiple databases.  Patient satisfied with Plan of action and management. All questions answered  Corrin Parker, M.D.  [facility]  Medical Director [place] Director [facility] Cardio-Pulmonary Department

## 2016-04-12 NOTE — Patient Instructions (Signed)
1.recommend CT chest to assess Lungs and look for mass 2.recommend PFT and 6MWT 3.check overnight pulse ox  Pulmonary Fibrosis Pulmonary fibrosis is a type of lung disease that causes scarring. Over time, the scar tissue (fibrosis) builds up in the air sacs of your lungs. This makes it hard for you to breathe. Less oxygen can get into your blood. Scarring from pulmonary fibrosis gets worse over time. This damage is permanent. Having damaged lungs may make it more likely that you will have heart problems as well. CAUSES Usually, the cause of pulmonary fibrosis is not known (idiopathic pulmonary fibrosis). However, pulmonary fibrosis can be caused by:   Exposure to occupational and environmental toxins. These include asbestos, silica, and metal dusts.  Inhaling moldy hay. This can cause an allergic reaction in the lung (farmer's lung) that can lead to pulmonary fibrosis.  Inhaling toxic fumes.  Certain medicines. These include drugs used in radiation therapy or used to treat seizures, heart problems, and some infections.  Autoimmune diseases, such as rheumatoid arthritis, systemic sclerosis, and connective tissue diseases.  Sarcoidosis. In this disease, areas of inflammatory cells (granulomas) form and most often affect the lungs.  Genes. Some cases of pulmonary fibrosis may be passed down through families. RISK FACTORS You may be at a higher risk for developing pulmonary fibrosis if:  You have a family history of the disease.  You have an autoimmune disease or another condition linked to pulmonary fibrosis.  You are exposed to certain substances or fumes found in agricultural, farm, Architect, or factory work.  You take certain medicines. SIGNS AND SYMPTOMS Symptoms may include:  Difficulty breathing that gets worse with activity.  Dry, hacking cough.  Rapid, shallow breathing during exercise or while at rest.  Shortness of breath that gets worse (dyspnea).  Bluish skin  and lips.  Loss of appetite.  Loss of strength.  Weight loss and fatigue.  Rounded and enlarged fingertips (clubbing). DIAGNOSIS Your health care provider may suspect pulmonary fibrosis based on your symptoms and medical history. Diagnosis may include a physical exam. Your health care provider will check for signs that strongly suggest that you have pulmonary fibrosis, such as:  Blue skin around your fingernails or mouth from reduced oxygen.  Clubbing around the ends of your fingers.  A crackling sound when you breathe. Your health care provider may also do tests to confirm the diagnosis. These may include:  Looking inside your lungs with an instrument (bronchoscopy).  Imaging studies of your lungs and heart using:  X-rays.  CT scan.  Sound waves (echocardiogram).  Tests to measure how well you are breathing (pulmonary function tests).  Exercise testing to see how well your lungs work while you are walking.  Blood tests.  A procedure to remove a small piece of lung tissue to examine in a lab (biopsy). TREATMENT There is no cure for pulmonary fibrosis. Treatments focus on managing symptoms and preventing scarring from getting worse. This can include:  Medicines.  You may take steroids to prevent permanent lung changes. Your health care provider may put you on a high dose at first, then on lower dosages for the long term.  Medicines to suppress your body's defense (immune) system. These can have serious side effects.  You may be monitored with X-rays and laboratory work.  Oxygen therapy may be helpful if oxygen in your blood is low.  Surgery. In some cases, a lung transplant is an option. HOME CARE INSTRUCTIONS  Take medicines only as directed by  your health care provider.  Keep your vaccinations up to date as recommended by your health care provider.  Do not use any tobacco products, including cigarettes, chewing tobacco, or electronic cigarettes. If you need  help quitting, ask your health care provider.  Get regular exercise, but do not overexert yourself. Ask your health care provider to suggest some activities that are safe for you to do. Walking and chair exercises can help if you have physical limitations.  Consider joining a pulmonary rehabilitation program or a support group for people with pulmonary fibrosis.  Eat small meals often so you do not get too full. Overeating can make breathing trouble worse.  Maintain a healthy weight. Lose weight if you need to.  Do breathing exercises as directed by your health care provider.  Keep all follow-up visits as directed by your health care provider. This is important. SEEK MEDICAL CARE IF:  You are not able to be as active as usual.  You have a long-lasting (chronic) cough.  You are often short of breath.  You have a fever or chills. SEEK IMMEDIATE MEDICAL CARE IF:  You have chest pain.  Your breathing is much worse.  You cannot take a deep breath.  You have blue skin around your mouth or fingers.  You have clubbing of your fingers.  You cough up mucus that is dark in color.  You have a lot of headaches.  You get very confused or sleepy.   This information is not intended to replace advice given to you by your health care provider. Make sure you discuss any questions you have with your health care provider.   Document Released: 11/03/2003 Document Revised: 09/03/2014 Document Reviewed: 01/21/2014 Elsevier Interactive Patient Education [employer].

## 2016-04-12 NOTE — Addendum Note (Signed)
Addended by: Oscar La R on: 04/12/2016 11:37 AM   Modules accepted: Orders

## 2016-04-12 NOTE — Addendum Note (Signed)
Addended by: Oscar La R on: 04/12/2016 11:41 AM   Modules accepted: Orders

## 2016-04-12 NOTE — Addendum Note (Signed)
Addended by: Oscar La R on: 04/12/2016 11:55 AM   Modules accepted: Orders

## 2016-04-16 ENCOUNTER — Encounter: Payer: Self-pay | Admitting: Internal Medicine

## 2016-04-18 DIAGNOSIS — D472 Monoclonal gammopathy: Secondary | ICD-10-CM | POA: Diagnosis not present

## 2016-04-18 DIAGNOSIS — Z9884 Bariatric surgery status: Secondary | ICD-10-CM | POA: Diagnosis not present

## 2016-04-18 DIAGNOSIS — E119 Type 2 diabetes mellitus without complications: Secondary | ICD-10-CM | POA: Diagnosis not present

## 2016-04-18 DIAGNOSIS — M069 Rheumatoid arthritis, unspecified: Secondary | ICD-10-CM | POA: Diagnosis not present

## 2016-04-18 DIAGNOSIS — Z7982 Long term (current) use of aspirin: Secondary | ICD-10-CM | POA: Diagnosis not present

## 2016-04-18 DIAGNOSIS — E43 Unspecified severe protein-calorie malnutrition: Secondary | ICD-10-CM | POA: Diagnosis not present

## 2016-04-18 DIAGNOSIS — J189 Pneumonia, unspecified organism: Secondary | ICD-10-CM | POA: Diagnosis not present

## 2016-04-18 DIAGNOSIS — Z7984 Long term (current) use of oral hypoglycemic drugs: Secondary | ICD-10-CM | POA: Diagnosis not present

## 2016-04-18 DIAGNOSIS — J8410 Pulmonary fibrosis, unspecified: Secondary | ICD-10-CM | POA: Diagnosis not present

## 2016-04-19 DIAGNOSIS — J8410 Pulmonary fibrosis, unspecified: Secondary | ICD-10-CM | POA: Diagnosis not present

## 2016-04-19 DIAGNOSIS — D472 Monoclonal gammopathy: Secondary | ICD-10-CM | POA: Diagnosis not present

## 2016-04-19 DIAGNOSIS — Z9884 Bariatric surgery status: Secondary | ICD-10-CM | POA: Diagnosis not present

## 2016-04-19 DIAGNOSIS — E119 Type 2 diabetes mellitus without complications: Secondary | ICD-10-CM | POA: Diagnosis not present

## 2016-04-19 DIAGNOSIS — M069 Rheumatoid arthritis, unspecified: Secondary | ICD-10-CM | POA: Diagnosis not present

## 2016-04-19 DIAGNOSIS — Z7984 Long term (current) use of oral hypoglycemic drugs: Secondary | ICD-10-CM | POA: Diagnosis not present

## 2016-04-19 DIAGNOSIS — J189 Pneumonia, unspecified organism: Secondary | ICD-10-CM | POA: Diagnosis not present

## 2016-04-19 DIAGNOSIS — E43 Unspecified severe protein-calorie malnutrition: Secondary | ICD-10-CM | POA: Diagnosis not present

## 2016-04-19 DIAGNOSIS — Z7982 Long term (current) use of aspirin: Secondary | ICD-10-CM | POA: Diagnosis not present

## 2016-04-20 ENCOUNTER — Telehealth: Payer: Self-pay

## 2016-04-20 NOTE — Telephone Encounter (Signed)
Pt daughter Ellen(dpr) is made aware of negative ONO results.  Nothing further needed.

## 2016-04-23 ENCOUNTER — Other Ambulatory Visit: Payer: Self-pay | Admitting: Family Medicine

## 2016-04-23 NOTE — Telephone Encounter (Signed)
Please ask her to get this from her pulmonologist moving forward; there may be something even better than I don't know about; we'll want him to manage all of her pulmonary concerns; thank you

## 2016-04-24 ENCOUNTER — Other Ambulatory Visit: Payer: Self-pay | Admitting: Family Medicine

## 2016-04-24 DIAGNOSIS — Z79899 Other long term (current) drug therapy: Secondary | ICD-10-CM | POA: Diagnosis not present

## 2016-04-24 NOTE — Telephone Encounter (Signed)
Please see previous note; I'd like patient to request this from her pulmonologist; this is for cough; there may be something better or he may want to adjust something else

## 2016-04-24 NOTE — Telephone Encounter (Signed)
Left voice mail

## 2016-04-26 DIAGNOSIS — M069 Rheumatoid arthritis, unspecified: Secondary | ICD-10-CM | POA: Diagnosis not present

## 2016-04-26 DIAGNOSIS — D472 Monoclonal gammopathy: Secondary | ICD-10-CM | POA: Diagnosis not present

## 2016-04-26 DIAGNOSIS — Z9884 Bariatric surgery status: Secondary | ICD-10-CM | POA: Diagnosis not present

## 2016-04-26 DIAGNOSIS — Z7984 Long term (current) use of oral hypoglycemic drugs: Secondary | ICD-10-CM | POA: Diagnosis not present

## 2016-04-26 DIAGNOSIS — E119 Type 2 diabetes mellitus without complications: Secondary | ICD-10-CM | POA: Diagnosis not present

## 2016-04-26 DIAGNOSIS — J189 Pneumonia, unspecified organism: Secondary | ICD-10-CM | POA: Diagnosis not present

## 2016-04-26 DIAGNOSIS — Z7982 Long term (current) use of aspirin: Secondary | ICD-10-CM | POA: Diagnosis not present

## 2016-04-26 DIAGNOSIS — J8410 Pulmonary fibrosis, unspecified: Secondary | ICD-10-CM | POA: Diagnosis not present

## 2016-04-26 DIAGNOSIS — E43 Unspecified severe protein-calorie malnutrition: Secondary | ICD-10-CM | POA: Diagnosis not present

## 2016-05-02 ENCOUNTER — Telehealth: Payer: Self-pay | Admitting: Family Medicine

## 2016-05-02 ENCOUNTER — Other Ambulatory Visit: Payer: Self-pay | Admitting: Family Medicine

## 2016-05-02 NOTE — Telephone Encounter (Signed)
Left voicemail, she needs to be getting from pulmonologist

## 2016-05-03 DIAGNOSIS — Z7984 Long term (current) use of oral hypoglycemic drugs: Secondary | ICD-10-CM | POA: Diagnosis not present

## 2016-05-03 DIAGNOSIS — J8410 Pulmonary fibrosis, unspecified: Secondary | ICD-10-CM | POA: Diagnosis not present

## 2016-05-03 DIAGNOSIS — E119 Type 2 diabetes mellitus without complications: Secondary | ICD-10-CM | POA: Diagnosis not present

## 2016-05-03 DIAGNOSIS — D472 Monoclonal gammopathy: Secondary | ICD-10-CM | POA: Diagnosis not present

## 2016-05-03 DIAGNOSIS — Z9884 Bariatric surgery status: Secondary | ICD-10-CM | POA: Diagnosis not present

## 2016-05-03 DIAGNOSIS — Z7982 Long term (current) use of aspirin: Secondary | ICD-10-CM | POA: Diagnosis not present

## 2016-05-03 DIAGNOSIS — M069 Rheumatoid arthritis, unspecified: Secondary | ICD-10-CM | POA: Diagnosis not present

## 2016-05-03 DIAGNOSIS — J189 Pneumonia, unspecified organism: Secondary | ICD-10-CM | POA: Diagnosis not present

## 2016-05-03 DIAGNOSIS — E43 Unspecified severe protein-calorie malnutrition: Secondary | ICD-10-CM | POA: Diagnosis not present

## 2016-05-04 ENCOUNTER — Other Ambulatory Visit: Payer: Self-pay | Admitting: Internal Medicine

## 2016-05-04 ENCOUNTER — Telehealth: Payer: Self-pay | Admitting: *Deleted

## 2016-05-04 ENCOUNTER — Ambulatory Visit (INDEPENDENT_AMBULATORY_CARE_PROVIDER_SITE_OTHER): Payer: Commercial Managed Care - HMO | Admitting: *Deleted

## 2016-05-04 ENCOUNTER — Ambulatory Visit
Admission: RE | Admit: 2016-05-04 | Discharge: 2016-05-04 | Disposition: A | Discharge status: Home / Self Care | Payer: Commercial Managed Care - HMO | Source: Ambulatory Visit | Attending: Internal Medicine | Admitting: Internal Medicine

## 2016-05-04 ENCOUNTER — Other Ambulatory Visit: Payer: Self-pay | Admitting: *Deleted

## 2016-05-04 ENCOUNTER — Other Ambulatory Visit: Payer: Self-pay | Admitting: Family Medicine

## 2016-05-04 DIAGNOSIS — I2510 Atherosclerotic heart disease of native coronary artery without angina pectoris: Secondary | ICD-10-CM | POA: Insufficient documentation

## 2016-05-04 DIAGNOSIS — X58XXXA Exposure to other specified factors, initial encounter: Secondary | ICD-10-CM | POA: Insufficient documentation

## 2016-05-04 DIAGNOSIS — Z951 Presence of aortocoronary bypass graft: Secondary | ICD-10-CM | POA: Diagnosis not present

## 2016-05-04 DIAGNOSIS — J189 Pneumonia, unspecified organism: Secondary | ICD-10-CM | POA: Insufficient documentation

## 2016-05-04 DIAGNOSIS — I700 Atherosclerosis of aorta: Secondary | ICD-10-CM | POA: Insufficient documentation

## 2016-05-04 DIAGNOSIS — J479 Bronchiectasis, uncomplicated: Secondary | ICD-10-CM | POA: Diagnosis not present

## 2016-05-04 DIAGNOSIS — J8410 Pulmonary fibrosis, unspecified: Secondary | ICD-10-CM | POA: Diagnosis not present

## 2016-05-04 DIAGNOSIS — T797XXA Traumatic subcutaneous emphysema, initial encounter: Secondary | ICD-10-CM | POA: Insufficient documentation

## 2016-05-04 MED ORDER — BENZONATATE 100 MG PO CAPS: 100.0000 mg | ORAL_CAPSULE | Freq: Three times a day (TID) | ORAL | 0 refills | Status: DC | PRN

## 2016-05-04 NOTE — Telephone Encounter (Signed)
-----   Message from Laverle Hobby, MD sent at 05/04/2016  4:34 PM EDT ----- Regarding: CT chest Review pt's ct chest images and report, there are finding are severe pulmonary fibrosis, there is also mild pneumomediastinum.   Please inform pt that this can cause chest pain, but by itself if not dangerous. If she develops worsening or severe chest pain she should call 911 or go to the ER as this may indicate that it may have developed into something else.

## 2016-05-04 NOTE — Telephone Encounter (Signed)
I have received this request and denied it already; please have her PULMONOLOGIST address this request; I would like all cough/lung medicines to come from her lung doctor; thank you

## 2016-05-04 NOTE — Progress Notes (Signed)
SMW performed today. 

## 2016-05-04 NOTE — Telephone Encounter (Signed)
Informed pt of results and answered her questions. Informed if or her daughter had any further questions to call back.

## 2016-05-04 NOTE — Telephone Encounter (Signed)
Pt came in for SM[facility] and DR let me do an rx for Tessalon to her pharmacy for the cough. Pt and daughter states she can't take the Hycodan and wants to know if we can do rx of tessalon with refills. Please advise. Thanks.

## 2016-05-14 ENCOUNTER — Other Ambulatory Visit: Payer: Self-pay | Admitting: Internal Medicine

## 2016-05-16 ENCOUNTER — Telehealth: Payer: Self-pay | Admitting: Internal Medicine

## 2016-05-16 DIAGNOSIS — M069 Rheumatoid arthritis, unspecified: Secondary | ICD-10-CM | POA: Diagnosis not present

## 2016-05-16 DIAGNOSIS — E119 Type 2 diabetes mellitus without complications: Secondary | ICD-10-CM | POA: Diagnosis not present

## 2016-05-16 DIAGNOSIS — Z79899 Other long term (current) drug therapy: Secondary | ICD-10-CM | POA: Diagnosis not present

## 2016-05-16 DIAGNOSIS — M0579 Rheumatoid arthritis with rheumatoid factor of multiple sites without organ or systems involvement: Secondary | ICD-10-CM | POA: Diagnosis not present

## 2016-05-16 DIAGNOSIS — D472 Monoclonal gammopathy: Secondary | ICD-10-CM | POA: Diagnosis not present

## 2016-05-16 DIAGNOSIS — J8410 Pulmonary fibrosis, unspecified: Secondary | ICD-10-CM | POA: Diagnosis not present

## 2016-05-16 DIAGNOSIS — M810 Age-related osteoporosis without current pathological fracture: Secondary | ICD-10-CM | POA: Diagnosis not present

## 2016-05-16 NOTE — Telephone Encounter (Signed)
Patient is out of tesslon pearls.  Please send refill to [facility] [address].  Patient takes these tid for cough and will be at the pharmacy picking up other medicines.

## 2016-05-22 ENCOUNTER — Encounter: Payer: Self-pay | Admitting: Internal Medicine

## 2016-05-22 ENCOUNTER — Ambulatory Visit (INDEPENDENT_AMBULATORY_CARE_PROVIDER_SITE_OTHER): Payer: Commercial Managed Care - HMO | Admitting: Internal Medicine

## 2016-05-22 VITALS — BP 106/68 | HR 95 | Wt 91.0 lb

## 2016-05-22 DIAGNOSIS — J8410 Pulmonary fibrosis, unspecified: Secondary | ICD-10-CM | POA: Diagnosis not present

## 2016-05-22 MED ORDER — BENZONATATE 100 MG PO CAPS: 100.0000 mg | ORAL_CAPSULE | Freq: Three times a day (TID) | ORAL | 6 refills | Status: AC | PRN

## 2016-05-22 MED ORDER — IPRATROPIUM-ALBUTEROL 0.5-2.5 (3) MG/3ML IN SOLN: 3.0000 mL | RESPIRATORY_TRACT | 10 refills | Status: AC | PRN

## 2016-05-22 NOTE — Progress Notes (Signed)
[facility] Consultation      Date: 05/22/2016,   MRN# [id number] Anita Atkins Aug 22, 1935 Code Status:  Hosp day:@LENGTHOFSTAYDAYS @ Referring MD: @ATDPROV @     PCP:      AdmissionWeight: 91 lb (41.3 kg)                 CurrentWeight: 91 lb (41.3 kg) Anita Atkins is a 81 y.o. old female seen in consultation for pulmonary fibrosis     CHIEF COMPLAINT:  Follow Chronic SOB and WOB, follow up pneumonia H/o pulmonary fibrosis    HISTORY OF PRESENT ILLNESS  81 yo white female follow up for for previous dx of pulm fibrosis apporx 10-15 years ago CT chest 05/04/16 shows extensive b/l interstitial opacities c/w fibrosis Feels very tired all the time very fatigued Her symptoms are SOB and DOE chronic, associated with weakness and palpitations with exertion, Patient risk factors includes heavy second hand smoke exposure,  Has re-started  OFEV 1  Patient has no fevers, chills at this time, no evidence of infection Patient sitting up and breathing comfortably, looks ill   6MWT 1000 feet no oxygen desats PFT 07/08/15 Ratio 92%  FVC 2.3 L 51% TLC 4.6 58% DLCO 48%   Office [employer] FVC 0.99L 43%predicted  ONO 06/2015 negative ONO on 04/16/16 Negative  Overall prognosis is very poor, I had lengthy discussion about QOL and daily activities, patient may need more help at home ie. Home healthcare and hospice   I have started discussion about DNR status  Overall, prognosis     Current Medication:  Current Outpatient Prescriptions:  .  acetaminophen (TYLENOL) 500 MG tablet, Take 500 mg by mouth every 6 (six) hours as needed. , Disp: , Rfl:  .  alendronate (FOSAMAX) 70 MG tablet, Take 1 tablet by mouth once a week., Disp: , Rfl:  .  aspirin EC 81 MG tablet, Take 81 mg by mouth daily., Disp: , Rfl:  .  azaTHIOprine (IMURAN) 50 MG tablet, Take 50 mg by mouth daily., Disp: , Rfl:  .  azelastine (ASTELIN) 0.1 % nasal spray, Place 1 spray into both nostrils 2 (two)  times daily as needed. , Disp: , Rfl:  .  benzonatate (TESSALON) 100 MG capsule, TAKE ONE CAPSULE BY MOUTH EVERY 8 HOURS AS NEEDED FOR COUGH, Disp: 30 capsule, Rfl: 0 .  feeding supplement (BOOST / RESOURCE BREEZE) LIQD, Take 1 Container by mouth 3 (three) times daily between meals., Disp: 237 mL, Rfl: 0 .  glucose blood (ACCU-CHEK AVIVA) test strip, Use as instructed to check sugars once a day; E11.9, LON 99 months, Disp: 100 each, Rfl: 1 .  guaiFENesin-codeine (ROBITUSSIN AC) 100-10 MG/5ML syrup, Take 5 mLs by mouth 3 (three) times daily as needed for cough., Disp: 120 mL, Rfl: 0 .  magic mouthwash SOLN, Take 5 mLs by mouth 4 (four) times daily as needed for mouth pain., Disp: 280 mL, Rfl: 0 .  mirtazapine (REMERON) 15 MG tablet, Take 1 tablet (15 mg total) by mouth at bedtime., Disp: 90 tablet, Rfl: 1 .  OFEV 150 MG CAPS, Take 150 mg by mouth 2 (two) times daily. , Disp: , Rfl:  .  pantoprazole (PROTONIX) 20 MG tablet, Take 1 tablet (20 mg total) by mouth daily., Disp: 90 tablet, Rfl: 8 .  predniSONE (DELTASONE) 5 MG tablet, Take 5 mg by mouth daily with breakfast. Take 1 to 1.5 by mouth daily for joint pain, Disp: , Rfl:  .  simvastatin (ZOCOR) 40 MG  tablet, Take 1 tablet (40 mg total) by mouth at bedtime., Disp: 90 tablet, Rfl: 3 .  SYMBICORT 160-4.5 MCG/ACT inhaler, Inhale 2 puffs into the lungs 2 (two) times daily., Disp: 1 Inhaler, Rfl: 5 .  traMADol (ULTRAM) 50 MG tablet, Take 1 tablet (50 mg total) by mouth every 12 (twelve) hours as needed for moderate pain or severe pain., Disp: 20 tablet, Rfl: 0 .  VENTOLIN HFA 108 (90 Base) MCG/ACT inhaler, Inhale 2 puffs into the lungs every 6 (six) hours as needed., Disp: 1 Inhaler, Rfl: 2    ALLERGIES   Plaquenil [hydroxychloroquine sulfate]     REVIEW OF SYSTEMS   Review of Systems  Constitutional: Negative for chills, fever, malaise/fatigue and weight loss.  HENT: Negative for congestion.   Eyes: Negative for blurred vision and double  vision.  Respiratory: Positive for shortness of breath. Negative for cough.   Cardiovascular: Negative for chest pain, palpitations and leg swelling.  Gastrointestinal: Negative for heartburn.  Genitourinary: Negative.   Skin: Negative for rash.  Neurological: Negative for dizziness, weakness and headaches.  All other systems reviewed and are negative.    VS:BP 106/68 (BP Location: Right Arm, Cuff Size: Normal)   Pulse 95   Wt 91 lb (41.3 kg)   SpO2 94%   BMI 17.19 kg/m     PHYSICAL EXAM  Physical Exam  Constitutional: She is oriented to person, place, and time. No distress.  Thin and cachectic  Eyes: EOM are normal.  Cardiovascular: Normal rate, regular rhythm and normal heart sounds.   No murmur heard. Pulmonary/Chest: No stridor. No respiratory distress. She has no wheezes. She has rales.  Musculoskeletal: Normal range of motion. She exhibits no edema.  Neurological: She is alert and oriented to person, place, and time.  Skin: She is not diaphoretic.  Psychiatric: She has a normal mood and affect.         CXR on 03/12/16 Images reviewed RLL opacity seen   CXR 8/7 Images Previous RLL opacity has resolved  CT chest 05/04/16 extensive b/l honeycomb pattern Images reviewed 05/22/2016 no lung masses       ASSESSMENT/PLAN   81 yo very pleasant white female with Pulmonary Fibrosis(no biopsy) for many years with chronic SOB and DOE with chronic cough   1.continue inhaled symbicort, albuterol as needed 2.contiue PPI 3..continue cough suppressant as needed with tessolon perles 4..follow up with cardiology as needed with dr Rockey Situ as needed 5.follow up GI consults for h/o gastric bypass as needed 6.continue OFEV and prednisone as prescribed 7.start Combivent Nebs every 4 hrs as needed for excessive bronchospasms and coughing spells    End of life goals of care discussed, patient is aware of poor resp status, daughter at visit Patient understands that she needs help  and family is supportive. SHe has stated that she does not want to lie in hospital bed and struggling to breathe. Patient was tearful, sad. But understands her breathing has compromised her well beinig I will need to confirm DNR status at next OV.      The Patient requires high complexity decision making for assessment and support, frequent evaluation and titration of therapies, application of advanced monitoring technologies and extensive interpretation of multiple databases.  Patient satisfied with Plan of action and management. All questions answered  Corrin Parker, M.D.  [facility]  Medical Director [place] Director [facility] Cardio-Pulmonary Department

## 2016-05-22 NOTE — Patient Instructions (Signed)
combivent neb therapy every 4 hrs as needed  Pulmonary Fibrosis Pulmonary fibrosis is a type of lung disease that causes scarring. Over time, the scar tissue (fibrosis) builds up in the air sacs of your lungs. This makes it hard for you to breathe. Less oxygen can get into your blood. Scarring from pulmonary fibrosis gets worse over time. This damage is permanent. Having damaged lungs may make it more likely that you will have heart problems as well. CAUSES Usually, the cause of pulmonary fibrosis is not known (idiopathic pulmonary fibrosis). However, pulmonary fibrosis can be caused by:   Exposure to occupational and environmental toxins. These include asbestos, silica, and metal dusts.  Inhaling moldy hay. This can cause an allergic reaction in the lung (farmer's lung) that can lead to pulmonary fibrosis.  Inhaling toxic fumes.  Certain medicines. These include drugs used in radiation therapy or used to treat seizures, heart problems, and some infections.  Autoimmune diseases, such as rheumatoid arthritis, systemic sclerosis, and connective tissue diseases.  Sarcoidosis. In this disease, areas of inflammatory cells (granulomas) form and most often affect the lungs.  Genes. Some cases of pulmonary fibrosis may be passed down through families. RISK FACTORS You may be at a higher risk for developing pulmonary fibrosis if:  You have a family history of the disease.  You have an autoimmune disease or another condition linked to pulmonary fibrosis.  You are exposed to certain substances or fumes found in agricultural, farm, Architect, or factory work.  You take certain medicines. SIGNS AND SYMPTOMS Symptoms may include:  Difficulty breathing that gets worse with activity.  Dry, hacking cough.  Rapid, shallow breathing during exercise or while at rest.  Shortness of breath that gets worse (dyspnea).  Bluish skin and lips.  Loss of appetite.  Loss of strength.  Weight  loss and fatigue.  Rounded and enlarged fingertips (clubbing). DIAGNOSIS Your health care provider may suspect pulmonary fibrosis based on your symptoms and medical history. Diagnosis may include a physical exam. Your health care provider will check for signs that strongly suggest that you have pulmonary fibrosis, such as:  Blue skin around your fingernails or mouth from reduced oxygen.  Clubbing around the ends of your fingers.  A crackling sound when you breathe. Your health care provider may also do tests to confirm the diagnosis. These may include:  Looking inside your lungs with an instrument (bronchoscopy).  Imaging studies of your lungs and heart using:  X-rays.  CT scan.  Sound waves (echocardiogram).  Tests to measure how well you are breathing (pulmonary function tests).  Exercise testing to see how well your lungs work while you are walking.  Blood tests.  A procedure to remove a small piece of lung tissue to examine in a lab (biopsy). TREATMENT There is no cure for pulmonary fibrosis. Treatments focus on managing symptoms and preventing scarring from getting worse. This can include:  Medicines.  You may take steroids to prevent permanent lung changes. Your health care provider may put you on a high dose at first, then on lower dosages for the long term.  Medicines to suppress your body's defense (immune) system. These can have serious side effects.  You may be monitored with X-rays and laboratory work.  Oxygen therapy may be helpful if oxygen in your blood is low.  Surgery. In some cases, a lung transplant is an option. HOME CARE INSTRUCTIONS  Take medicines only as directed by your health care provider.  Keep your vaccinations up to  date as recommended by your health care provider.  Do not use any tobacco products, including cigarettes, chewing tobacco, or electronic cigarettes. If you need help quitting, ask your health care provider.  Get regular  exercise, but do not overexert yourself. Ask your health care provider to suggest some activities that are safe for you to do. Walking and chair exercises can help if you have physical limitations.  Consider joining a pulmonary rehabilitation program or a support group for people with pulmonary fibrosis.  Eat small meals often so you do not get too full. Overeating can make breathing trouble worse.  Maintain a healthy weight. Lose weight if you need to.  Do breathing exercises as directed by your health care provider.  Keep all follow-up visits as directed by your health care provider. This is important. SEEK MEDICAL CARE IF:  You are not able to be as active as usual.  You have a long-lasting (chronic) cough.  You are often short of breath.  You have a fever or chills. SEEK IMMEDIATE MEDICAL CARE IF:  You have chest pain.  Your breathing is much worse.  You cannot take a deep breath.  You have blue skin around your mouth or fingers.  You have clubbing of your fingers.  You cough up mucus that is dark in color.  You have a lot of headaches.  You get very confused or sleepy.   This information is not intended to replace advice given to you by your health care provider. Make sure you discuss any questions you have with your health care provider.   Document Released: 11/03/2003 Document Revised: 09/03/2014 Document Reviewed: 01/21/2014 Elsevier Interactive Patient Education [employer].

## 2016-05-23 DIAGNOSIS — R0602 Shortness of breath: Secondary | ICD-10-CM | POA: Diagnosis not present

## 2016-05-23 DIAGNOSIS — J8410 Pulmonary fibrosis, unspecified: Secondary | ICD-10-CM | POA: Diagnosis not present

## 2016-06-12 DIAGNOSIS — M0579 Rheumatoid arthritis with rheumatoid factor of multiple sites without organ or systems involvement: Secondary | ICD-10-CM | POA: Diagnosis not present

## 2016-06-12 DIAGNOSIS — E119 Type 2 diabetes mellitus without complications: Secondary | ICD-10-CM | POA: Diagnosis not present

## 2016-06-12 DIAGNOSIS — J8410 Pulmonary fibrosis, unspecified: Secondary | ICD-10-CM | POA: Diagnosis not present

## 2016-06-12 DIAGNOSIS — M069 Rheumatoid arthritis, unspecified: Secondary | ICD-10-CM | POA: Diagnosis not present

## 2016-06-12 DIAGNOSIS — M810 Age-related osteoporosis without current pathological fracture: Secondary | ICD-10-CM | POA: Diagnosis not present

## 2016-06-12 DIAGNOSIS — Z79899 Other long term (current) drug therapy: Secondary | ICD-10-CM | POA: Diagnosis not present

## 2016-07-09 ENCOUNTER — Encounter: Payer: Self-pay | Admitting: Emergency Medicine

## 2016-07-09 ENCOUNTER — Emergency Department: Payer: Commercial Managed Care - HMO

## 2016-07-09 ENCOUNTER — Emergency Department
Admission: EM | Admit: 2016-07-09 | Discharge: 2016-07-09 | Disposition: A | Discharge status: Home / Self Care | Payer: Commercial Managed Care - HMO | Attending: Emergency Medicine | Admitting: Emergency Medicine

## 2016-07-09 DIAGNOSIS — W1809XA Striking against other object with subsequent fall, initial encounter: Secondary | ICD-10-CM | POA: Diagnosis not present

## 2016-07-09 DIAGNOSIS — E119 Type 2 diabetes mellitus without complications: Secondary | ICD-10-CM | POA: Insufficient documentation

## 2016-07-09 DIAGNOSIS — M25511 Pain in right shoulder: Secondary | ICD-10-CM | POA: Diagnosis not present

## 2016-07-09 DIAGNOSIS — Z79899 Other long term (current) drug therapy: Secondary | ICD-10-CM | POA: Diagnosis not present

## 2016-07-09 DIAGNOSIS — Y999 Unspecified external cause status: Secondary | ICD-10-CM | POA: Insufficient documentation

## 2016-07-09 DIAGNOSIS — Z7982 Long term (current) use of aspirin: Secondary | ICD-10-CM | POA: Insufficient documentation

## 2016-07-09 DIAGNOSIS — Y92009 Unspecified place in unspecified non-institutional (private) residence as the place of occurrence of the external cause: Secondary | ICD-10-CM | POA: Diagnosis not present

## 2016-07-09 DIAGNOSIS — W19XXXA Unspecified fall, initial encounter: Secondary | ICD-10-CM

## 2016-07-09 DIAGNOSIS — Y939 Activity, unspecified: Secondary | ICD-10-CM | POA: Diagnosis not present

## 2016-07-09 DIAGNOSIS — I2510 Atherosclerotic heart disease of native coronary artery without angina pectoris: Secondary | ICD-10-CM | POA: Diagnosis not present

## 2016-07-09 DIAGNOSIS — S4991XA Unspecified injury of right shoulder and upper arm, initial encounter: Secondary | ICD-10-CM | POA: Diagnosis not present

## 2016-07-09 MED ORDER — OXYCODONE-ACETAMINOPHEN 5-325 MG PO TABS
2.0000 | ORAL_TABLET | Freq: Once | ORAL | Status: AC
  Administered 2016-07-09: 2 via ORAL
  Filled 2016-07-09: qty 2

## 2016-07-09 MED ORDER — OXYCODONE-ACETAMINOPHEN 5-325 MG PO TABS: 1.0000 | ORAL_TABLET | Freq: Four times a day (QID) | ORAL | 0 refills | Status: DC | PRN

## 2016-07-09 NOTE — ED Notes (Signed)
Pt reports that she fell Sunday at home and injured her right shoulder - she is having pain with ROM to right shoulder and unable to lift right arm without assistance - pt states she cannot pick up items with that arm either - pt has equal hand grips and cap refill is less than 3 sec in bilat fingertips

## 2016-07-09 NOTE — Discharge Instructions (Signed)
As we discussed please take your pain medication as needed, wear shoulder immobilizer for comfort but removed for 2-3 hours per day and move your shoulder around to prevent loss of range of motion.  If the pain is not significantly better in the next several days please call the number provided for orthopedics to arrange a follow-up appointment as soon as possible. Return to the emergency department for any significant worsening of pain, or any other symptoms personally concerning to yourself.

## 2016-07-09 NOTE — ED Provider Notes (Signed)
[facility] Emergency Department Provider Note  Time seen: 3:12 PM  I have reviewed the triage vital signs and the nursing notes.   HISTORY  Chief Complaint Fall and Shoulder Pain    HPI Anita Atkins is a 81 y.o. female with a past medical history of rheumatoid arthritis, hyperlipidemia, diabetes, osteopenia, who presents the emergency department after a fall. According to the patient last night she had a mechanical fall onto her right side. States she did hit her head but denies LOC or vomiting. Denies any headache today. Does not take a blood thinners. Patient's main complaint is of pain in the right shoulder blade. States the pain is much worse with any attempted movement. States she could not sleep last night due to the discomfort so she came to the emergency department today for evaluation. Denies any other pain or injuries. Describes her pain as moderate, constant dull pain with significant sharp pain upon attempted movement  Past Medical History:  Diagnosis Date  . Diabetes mellitus without complication ([place])   . High cholesterol   . Monoclonal paraproteinemia 01/31/2016  . Pulmonary fibrosis ([place])   . RA (rheumatoid arthritis) ([place])   . Shingles   . Status post bariatric surgery 02/13/2016    Patient Active Problem List   Diagnosis Date Noted  . Weakness 04/06/2016  . Right lower lobe pneumonia ([place]) 03/12/2016  . Pneumonia 03/06/2016  . Protein-calorie malnutrition, severe 03/06/2016  . Status post bariatric surgery 02/13/2016  . Monoclonal paraproteinemia 01/31/2016  . Elevated sedimentation rate 08/15/2015  . History of ITP 07/18/2015  . Abnormal SPEP 07/18/2015  . Diabetic neuropathy ([place]) 07/13/2015  . Macrocytosis 07/13/2015  . Depression, major, single episode, mild ([place]) 07/13/2015  . Chronic diarrhea 07/13/2015  . Cataracts, bilateral 06/20/2015  . Hyperkalemia 06/17/2015  . Cough, persistent 06/15/2015  . Rheumatoid arthritis ([place])  06/15/2015  . Osteoporosis 06/15/2015  . Pulmonary fibrosis ([place]) 06/15/2015  . Type 2 diabetes mellitus without complication, without long-term current use of insulin ([place]) 06/15/2015  . Coronary artery disease involving native coronary artery of native heart without angina pectoris 06/15/2015  . Vitamin D deficiency 06/15/2015  . High blood cholesterol 06/15/2015  . Fatigue 06/15/2015  . Bariatric surgery status 06/15/2015  . Needs flu shot 06/15/2015  . Need for prophylactic vaccination with Streptococcus pneumoniae (Pneumococcus) and Influenza vaccines 06/15/2015    Past Surgical History:  Procedure Laterality Date  . CARDIAC SURGERY    . CHOLECYSTECTOMY    . EXPLORATION POST OPERATIVE OPEN HEART    . GASTRIC BYPASS  2007    Prior to Admission medications   Medication Sig Start Date End Date Taking? Authorizing Provider  acetaminophen (TYLENOL) 500 MG tablet Take 500 mg by mouth every 6 (six) hours as needed.     Historical Provider, MD  alendronate (FOSAMAX) 70 MG tablet Take 1 tablet by mouth once a week. 11/11/15   Historical Provider, MD  aspirin EC 81 MG tablet Take 81 mg by mouth daily.    Historical Provider, MD  azaTHIOprine (IMURAN) 50 MG tablet Take 50 mg by mouth daily. 03/21/16   Historical Provider, MD  azelastine (ASTELIN) 0.1 % nasal spray Place 1 spray into both nostrils 2 (two) times daily as needed.  03/16/15   Historical Provider, MD  benzonatate (TESSALON) 100 MG capsule Take 1 capsule (100 mg total) by mouth 3 (three) times daily as needed for cough. 05/22/16   Flora Lipps, MD  feeding supplement (BOOST / RESOURCE BREEZE)  LIQD Take 1 Container by mouth 3 (three) times daily between meals. 03/08/16   Vaughan Basta, MD  glucose blood (ACCU-CHEK AVIVA) test strip Use as instructed to check sugars once a day; E11.9, LON 99 months 03/13/16   Arnetha Courser, MD  guaiFENesin-codeine (ROBITUSSIN AC) 100-10 MG/5ML syrup Take 5 mLs by mouth 3 (three) times daily as  needed for cough. 04/12/16   Flora Lipps, MD  ipratropium-albuterol (DUONEB) 0.5-2.5 (3) MG/3ML SOLN Take 3 mLs by nebulization every 4 (four) hours as needed. 05/22/16   Flora Lipps, MD  magic mouthwash SOLN Take 5 mLs by mouth 4 (four) times daily as needed for mouth pain. 03/12/16   Arnetha Courser, MD  mirtazapine (REMERON) 15 MG tablet Take 1 tablet (15 mg total) by mouth at bedtime. 02/13/16   Arnetha Courser, MD  OFEV 150 MG CAPS Take 150 mg by mouth 2 (two) times daily.  07/25/15   Historical Provider, MD  pantoprazole (PROTONIX) 20 MG tablet Take 1 tablet (20 mg total) by mouth daily. 12/07/15 12/06/16  Flora Lipps, MD  predniSONE (DELTASONE) 5 MG tablet Take 5 mg by mouth daily with breakfast. Take 1 to 1.5 by mouth daily for joint pain    Historical Provider, MD  simvastatin (ZOCOR) 40 MG tablet Take 1 tablet (40 mg total) by mouth at bedtime. 08/17/15   Minna Merritts, MD  SYMBICORT 160-4.5 MCG/ACT inhaler Inhale 2 puffs into the lungs 2 (two) times daily. 04/12/16   Flora Lipps, MD  traMADol (ULTRAM) 50 MG tablet Take 1 tablet (50 mg total) by mouth every 12 (twelve) hours as needed for moderate pain or severe pain. 03/08/16   Vaughan Basta, MD  VENTOLIN HFA 108 (90 Base) MCG/ACT inhaler Inhale 2 puffs into the lungs every 6 (six) hours as needed. 04/12/16   Flora Lipps, MD    Allergies  Allergen Reactions  . Plaquenil [Hydroxychloroquine Sulfate] Rash    Family History  Problem Relation Age of Onset  . Heart disease Mother   . Stroke Mother   . Diabetes Sister   . Hyperlipidemia Sister   . Cancer Maternal Aunt     female  . COPD Neg Hx   . Hypertension Neg Hx     Social History Social History  Substance Use Topics  . Smoking status: Never Smoker  . Smokeless tobacco: Never Used  . Alcohol use No    Review of Systems Constitutional: Negative for fever. Cardiovascular: Negative for chest pain. Respiratory: Negative for shortness of breath. Gastrointestinal:  Negative for abdominal pain Neurological: Negative for headaches, focal weakness or numbness. 10-point ROS otherwise negative.  ____________________________________________   PHYSICAL EXAM:  VITAL SIGNS: ED Triage Vitals  Enc Vitals Group     BP 07/09/16 1251 (!) 147/85     Pulse Rate 07/09/16 1251 (!) 117     Resp 07/09/16 1251 18     Temp 07/09/16 1251 97.6 F (36.4 C)     Temp Source 07/09/16 1251 Oral     SpO2 07/09/16 1251 94 %     Weight 07/09/16 1251 89 lb (40.4 kg)     Height 07/09/16 1251 5' 1.5" (1.562 m)     Head Circumference --      Peak Flow --      Pain Score 07/09/16 1252 10     Pain Loc --      Pain Edu? --      Excl. in [place]? --     Constitutional:  Alert and oriented. Well appearing and in no distress. Eyes: Normal exam ENT   Head: Normocephalic and atraumatic.   Mouth/Throat: Mucous membranes are moist. Cardiovascular: Normal rate, regular rhythm. No murmur Respiratory: Normal respiratory effort without tachypnea nor retractions. Breath sounds are clear  Gastrointestinal: Soft and nontender. No distention.   Musculoskeletal: Moderate tenderness to palpation of the right scapula, mild tenderness of the right shoulder. Good range of motion in the shoulder. Neurovascularly intact. No C-spine to L-spine tenderness. Extremities otherwise atraumatic. Neurologic:  Normal speech and language. No gross focal neurologic deficits  Skin:  Skin is warm, dry and intact.  Psychiatric: Mood and affect are normal.   ____________________________________________     RADIOLOGY  No definite fracture is observed but the images are limited due to patient positioning and osteopenia. There are degenerative changes manifested by [facility] joint and subacromial subdeltoid space narrowing. If there is strong clinical concern of a scapular fracture, CT scanning would be a useful next step. If rotator cuff or other soft tissue abnormality of the shoulders felt more likely, MRI  would be the most useful next imaging step.  ____________________________________________   INITIAL IMPRESSION / ASSESSMENT AND PLAN / ED COURSE  Pertinent labs & imaging results that were available during my care of the patient were reviewed by me and considered in my medical decision making (see chart for details).  Patient has significant pain in the right scapula, moderate tenderness on exam. Patient is x-ray shows no definitive fracture, but they could not completely rule out a fracture and recommend CT. However the treatment would be mostly the same, I discussed this with the patient and she is in agreement that she would like to hold off on the CT at this time. We'll place the patient in a shoulder immobilizer, provide pain medication and have her follow-up with orthopedics.  ____________________________________________   FINAL CLINICAL IMPRESSION(S) / ED DIAGNOSES  Right shoulder pain Cheral Marker, MD 07/09/16 [phone]

## 2016-07-09 NOTE — ED Triage Notes (Signed)
Pt presents to ED with reports of mechanical fall last night. Pt denies dizziness prior to fall. Pt reports right shoulder pain with difficulty moving arm. Right shoulder appears lower than left. Pt reports hitting back of head. Pt denies LOC.

## 2016-07-10 DIAGNOSIS — M25511 Pain in right shoulder: Secondary | ICD-10-CM | POA: Diagnosis not present

## 2016-07-10 DIAGNOSIS — G8911 Acute pain due to trauma: Secondary | ICD-10-CM | POA: Insufficient documentation

## 2016-07-10 DIAGNOSIS — M0579 Rheumatoid arthritis with rheumatoid factor of multiple sites without organ or systems involvement: Secondary | ICD-10-CM | POA: Diagnosis not present

## 2016-07-10 DIAGNOSIS — M810 Age-related osteoporosis without current pathological fracture: Secondary | ICD-10-CM | POA: Diagnosis not present

## 2016-07-10 DIAGNOSIS — J8410 Pulmonary fibrosis, unspecified: Secondary | ICD-10-CM | POA: Diagnosis not present

## 2016-07-10 DIAGNOSIS — Z79899 Other long term (current) drug therapy: Secondary | ICD-10-CM | POA: Diagnosis not present

## 2016-07-26 DIAGNOSIS — M810 Age-related osteoporosis without current pathological fracture: Secondary | ICD-10-CM | POA: Diagnosis not present

## 2016-08-03 ENCOUNTER — Telehealth: Payer: Self-pay | Admitting: Internal Medicine

## 2016-08-03 NOTE — Telephone Encounter (Signed)
Please call patient's daughter Dorian Pod as she is worried about her mother. Daughter states that her mother has diarrhea as well. She is needing a pain patch for the pain in her rib are. She is in a lot of pain when breathing.

## 2016-08-03 NOTE — Telephone Encounter (Signed)
Called and spoke with pts daughter and she is aware of MW recs.  She stated that she will watch her over the weekend and take her to the ER is needed.  She stated that the pt has an appt with Dr. Mortimer Fries next week.

## 2016-08-03 NOTE — Telephone Encounter (Signed)
Pt daughter states yesterday afternoon pt started having left rib pain, last time this occurred pt had PNA. Pt has had increased weakness over the last week, no other symptoms . Pt daughter is requesting recommendations.  DS please advise. Thanks.

## 2016-08-03 NOTE — Telephone Encounter (Signed)
DS please advise. thanks

## 2016-08-03 NOTE — Telephone Encounter (Signed)
We can't rx with pain patches over the phone and best to have this eval by ER since office closing early due to weather

## 2016-08-03 NOTE — Telephone Encounter (Signed)
MW could you please advise on this message.

## 2016-08-03 NOTE — Telephone Encounter (Signed)
Pt daughter calling stating pt is having pain in her left side Last time this happened on her right side, they found out it was Pneumonia  Would like to know what can be done  Please advise

## 2016-08-06 NOTE — Telephone Encounter (Signed)
Spoke with pt daughter, who states pt seemed to be feeling a little better yesterday. Pt scheduled to f/u with DK on 08/09/16, pt daughter has requested a sooner appt. Pt scheduled for 08-07-16 @ 10:30am. Nothing further needed.

## 2016-08-06 NOTE — Telephone Encounter (Signed)
  LM for pt daughter. Will await call back

## 2016-08-07 ENCOUNTER — Ambulatory Visit (INDEPENDENT_AMBULATORY_CARE_PROVIDER_SITE_OTHER): Payer: Commercial Managed Care - HMO | Admitting: Internal Medicine

## 2016-08-07 ENCOUNTER — Ambulatory Visit
Admission: RE | Admit: 2016-08-07 | Discharge: 2016-08-07 | Disposition: A | Discharge status: Home / Self Care | Payer: Commercial Managed Care - HMO | Source: Ambulatory Visit | Attending: Internal Medicine | Admitting: Internal Medicine

## 2016-08-07 ENCOUNTER — Encounter: Payer: Self-pay | Admitting: Internal Medicine

## 2016-08-07 ENCOUNTER — Inpatient Hospital Stay: Payer: Commercial Managed Care - HMO | Attending: Oncology

## 2016-08-07 VITALS — BP 120/70 | HR 111 | Ht 60.5 in | Wt 88.0 lb

## 2016-08-07 DIAGNOSIS — M069 Rheumatoid arthritis, unspecified: Secondary | ICD-10-CM | POA: Insufficient documentation

## 2016-08-07 DIAGNOSIS — J181 Lobar pneumonia, unspecified organism: Principal | ICD-10-CM

## 2016-08-07 DIAGNOSIS — Z79899 Other long term (current) drug therapy: Secondary | ICD-10-CM | POA: Diagnosis not present

## 2016-08-07 DIAGNOSIS — J189 Pneumonia, unspecified organism: Secondary | ICD-10-CM

## 2016-08-07 DIAGNOSIS — I700 Atherosclerosis of aorta: Secondary | ICD-10-CM | POA: Diagnosis not present

## 2016-08-07 DIAGNOSIS — E7800 Pure hypercholesterolemia, unspecified: Secondary | ICD-10-CM | POA: Diagnosis not present

## 2016-08-07 DIAGNOSIS — Z9884 Bariatric surgery status: Secondary | ICD-10-CM | POA: Diagnosis not present

## 2016-08-07 DIAGNOSIS — R634 Abnormal weight loss: Secondary | ICD-10-CM | POA: Insufficient documentation

## 2016-08-07 DIAGNOSIS — J8410 Pulmonary fibrosis, unspecified: Secondary | ICD-10-CM | POA: Insufficient documentation

## 2016-08-07 DIAGNOSIS — E119 Type 2 diabetes mellitus without complications: Secondary | ICD-10-CM | POA: Insufficient documentation

## 2016-08-07 DIAGNOSIS — R630 Anorexia: Secondary | ICD-10-CM | POA: Diagnosis not present

## 2016-08-07 DIAGNOSIS — D472 Monoclonal gammopathy: Secondary | ICD-10-CM | POA: Diagnosis not present

## 2016-08-07 DIAGNOSIS — Z7982 Long term (current) use of aspirin: Secondary | ICD-10-CM | POA: Insufficient documentation

## 2016-08-07 DIAGNOSIS — R778 Other specified abnormalities of plasma proteins: Secondary | ICD-10-CM

## 2016-08-07 DIAGNOSIS — R0602 Shortness of breath: Secondary | ICD-10-CM | POA: Diagnosis not present

## 2016-08-07 LAB — CBC WITH DIFFERENTIAL/PLATELET
BASOS PCT: 3 %
Basophils Absolute: 0.2 10*3/uL — ABNORMAL HIGH (ref 0–0.1)
Eosinophils Absolute: 0.3 10*3/uL (ref 0–0.7)
Eosinophils Relative: 3 %
HEMATOCRIT: 37.5 % (ref 35.0–47.0)
HEMOGLOBIN: 13.2 g/dL (ref 12.0–16.0)
LYMPHS ABS: 1 10*3/uL (ref 1.0–3.6)
Lymphocytes Relative: 11 %
MCH: 36 pg — AB (ref 26.0–34.0)
MCHC: 35.1 g/dL (ref 32.0–36.0)
MCV: 102.5 fL — ABNORMAL HIGH (ref 80.0–100.0)
MONOS PCT: 5 %
Monocytes Absolute: 0.4 10*3/uL (ref 0.2–0.9)
NEUTROS ABS: 7.5 10*3/uL — AB (ref 1.4–6.5)
NEUTROS PCT: 80 %
Platelets: 332 10*3/uL (ref 150–440)
RBC: 3.66 MIL/uL — AB (ref 3.80–5.20)
RDW: 13.3 % (ref 11.5–14.5)
WBC: 9.5 10*3/uL (ref 3.6–11.0)

## 2016-08-07 LAB — BASIC METABOLIC PANEL
Anion gap: 8 (ref 5–15)
BUN: 10 mg/dL (ref 6–20)
CHLORIDE: 96 mmol/L — AB (ref 101–111)
CO2: 29 mmol/L (ref 22–32)
CREATININE: 0.53 mg/dL (ref 0.44–1.00)
Calcium: 8.8 mg/dL — ABNORMAL LOW (ref 8.9–10.3)
GFR calc non Af Amer: >60 mL/min (ref >60)
Glucose, Bld: 224 mg/dL — ABNORMAL HIGH (ref 65–99)
POTASSIUM: 3.5 mmol/L (ref 3.5–5.1)
SODIUM: 133 mmol/L — AB (ref 135–145)

## 2016-08-07 MED ORDER — AZITHROMYCIN 250 MG PO TABS: ORAL_TABLET | ORAL | 0 refills | Status: DC

## 2016-08-07 MED ORDER — OXYCODONE-ACETAMINOPHEN 5-325 MG PO TABS: 1.0000 | ORAL_TABLET | ORAL | 0 refills | Status: DC | PRN

## 2016-08-07 NOTE — Patient Instructions (Addendum)
Start Z pak Pain meds as needed Chest X ray today

## 2016-08-07 NOTE — Progress Notes (Signed)
[facility] Consultation      Date: 08/07/2016,   MRN# [id number] Anita Atkins 11-Aug-1935 Code Status:  Hosp day:@LENGTHOFSTAYDAYS @ Referring MD: @ATDPROV @     PCP:      AdmissionWeight: 88 lb (39.9 kg)                 CurrentWeight: 88 lb (39.9 kg) Anita Atkins is a 81 y.o. old female seen in consultation for pulmonary fibrosis     CHIEF COMPLAINT:  Follow Chronic SOB and WOB, follow up pneumonia H/o pulmonary fibrosis    HISTORY OF PRESENT ILLNESS  82 yo white female follow up for for previous dx of pulm fibrosis apporx 10-15 years ago CT chest 05/04/16 shows extensive b/l interstitial opacities c/w fibrosis Feels very tired all the time very fatigued Her symptoms are SOB and DOE chronic, associated with weakness and palpitations with exertion, Patient risk factors includes heavy second hand smoke exposure,  Has re-started  OFEV 1  Patient has no fevers, chills at this time, no evidence of infection Patient sitting up and breathing comfortably, looks ill   6MWT 1000 feet no oxygen desats PFT 07/08/15 Ratio 92%  FVC 2.3 L 51% TLC 4.6 58% DLCO 48%   Office Spiro FVC 0.99L 43%predicted  ONO 06/2015 negative ONO on 04/16/16 Negative   Patient not feeling well has left sided pleuritic chest pain Last time she had this, she ws dx with pneumonia   Overall prognosis is very poor, I had lengthy discussion about QOL and daily activities, patient may need more help at home ie. Home healthcare and hospice   I have started discussion about DNR status  Overall, prognosis     Current Medication:  Current Outpatient Prescriptions:  .  acetaminophen (TYLENOL) 500 MG tablet, Take 500 mg by mouth every 6 (six) hours as needed. , Disp: , Rfl:  .  alendronate (FOSAMAX) 70 MG tablet, Take 1 tablet by mouth once a week., Disp: , Rfl:  .  aspirin EC 81 MG tablet, Take 81 mg by mouth daily., Disp: , Rfl:  .  azaTHIOprine (IMURAN) 50 MG tablet, Take 50 mg  by mouth daily., Disp: , Rfl:  .  azelastine (ASTELIN) 0.1 % nasal spray, Place 1 spray into both nostrils 2 (two) times daily as needed. , Disp: , Rfl:  .  benzonatate (TESSALON) 100 MG capsule, Take 1 capsule (100 mg total) by mouth 3 (three) times daily as needed for cough., Disp: 120 capsule, Rfl: 6 .  feeding supplement (BOOST / RESOURCE BREEZE) LIQD, Take 1 Container by mouth 3 (three) times daily between meals., Disp: 237 mL, Rfl: 0 .  glucose blood (ACCU-CHEK AVIVA) test strip, Use as instructed to check sugars once a day; E11.9, LON 99 months, Disp: 100 each, Rfl: 1 .  ipratropium-albuterol (DUONEB) 0.5-2.5 (3) MG/3ML SOLN, Take 3 mLs by nebulization every 4 (four) hours as needed., Disp: 360 mL, Rfl: 10 .  magic mouthwash SOLN, Take 5 mLs by mouth 4 (four) times daily as needed for mouth pain., Disp: 280 mL, Rfl: 0 .  mirtazapine (REMERON) 15 MG tablet, Take 1 tablet (15 mg total) by mouth at bedtime., Disp: 90 tablet, Rfl: 1 .  OFEV 150 MG CAPS, Take 150 mg by mouth 2 (two) times daily. , Disp: , Rfl:  .  oxyCODONE-acetaminophen (ROXICET) 5-325 MG tablet, Take 1 tablet by mouth every 6 (six) hours as needed., Disp: 20 tablet, Rfl: 0 .  pantoprazole (PROTONIX) 20 MG tablet, Take  1 tablet (20 mg total) by mouth daily., Disp: 90 tablet, Rfl: 8 .  predniSONE (DELTASONE) 5 MG tablet, Take 5 mg by mouth daily with breakfast. Take 1 to 1.5 by mouth daily for joint pain, Disp: , Rfl:  .  simvastatin (ZOCOR) 40 MG tablet, Take 1 tablet (40 mg total) by mouth at bedtime., Disp: 90 tablet, Rfl: 3 .  SYMBICORT 160-4.5 MCG/ACT inhaler, Inhale 2 puffs into the lungs 2 (two) times daily., Disp: 1 Inhaler, Rfl: 5 .  traMADol (ULTRAM) 50 MG tablet, Take 1 tablet (50 mg total) by mouth every 12 (twelve) hours as needed for moderate pain or severe pain., Disp: 20 tablet, Rfl: 0 .  VENTOLIN HFA 108 (90 Base) MCG/ACT inhaler, Inhale 2 puffs into the lungs every 6 (six) hours as needed., Disp: 1 Inhaler, Rfl:  2    ALLERGIES   Plaquenil [hydroxychloroquine sulfate]     REVIEW OF SYSTEMS   Review of Systems  Constitutional: Negative for chills, fever, malaise/fatigue and weight loss.  HENT: Negative for congestion.   Eyes: Negative for blurred vision and double vision.  Respiratory: Positive for shortness of breath. Negative for cough.   Cardiovascular: Negative for chest pain, palpitations and leg swelling.  Gastrointestinal: Negative for heartburn.  Genitourinary: Negative.   Skin: Negative for rash.  Neurological: Negative for dizziness, weakness and headaches.  All other systems reviewed and are negative.    VS:BP 120/70 (BP Location: Left Arm, Cuff Size: Normal)   Pulse (!) 111   Ht 5' 0.5" (1.537 m)   Wt 88 lb (39.9 kg)   SpO2 94%   BMI 16.90 kg/m     PHYSICAL EXAM  Physical Exam  Constitutional: She is oriented to person, place, and time. No distress.  Thin and cachectic  Eyes: EOM are normal.  Cardiovascular: Normal rate, regular rhythm and normal heart sounds.   No murmur heard. Pulmonary/Chest: No stridor. No respiratory distress. She has no wheezes. She has rales.  Musculoskeletal: Normal range of motion. She exhibits no edema.  Neurological: She is alert and oriented to person, place, and time.  Skin: She is not diaphoretic.  Psychiatric: She has a normal mood and affect.         CXR on 03/12/16 Images reviewed RLL opacity seen   CXR 8/7 Images Previous RLL opacity has resolved  CT chest 05/04/16 extensive b/l honeycomb pattern Images reviewed  no lung masses       ASSESSMENT/PLAN   81 yo very pleasant white female with Pulmonary Fibrosis(no biopsy) for many years with chronic SOB and DOE with chronic cough  Very poor prognosis and very poor resp status  1.continue inhaled symbicort, albuterol as needed 2.contiue PPI 3..continue cough suppressant as needed with tessolon perles 4..follow up with cardiology as needed with dr Rockey Situ as  needed 5.follow up GI consults for h/o gastric bypass as needed 6.continue OFEV and prednisone as prescribed 7. Combivent Nebs every 4 hrs as needed for excessive bronchospasms and coughing spells 8.check CXR assess for pneumonia 9.start Z pak 10. Percocet as needed   End of life goals of care discussed, patient is aware of poor resp status, daughter at visit Patient understands that she needs help and family is supportive. SHe has stated that she does not want to lie in hospital bed and struggling to breathe. Patient was tearful, sad. But understands her breathing has compromised her well beinig I will need to confirm DNR status at next OV.  The Patient requires high complexity decision making for assessment and support, frequent evaluation and titration of therapies, application of advanced monitoring technologies and extensive interpretation of multiple databases.  Patient satisfied with Plan of action and management. All questions answered  Corrin Parker, M.D.  [facility]  Medical Director [place] Director [facility] Cardio-Pulmonary Department

## 2016-08-08 LAB — KAPPA/LAMBDA LIGHT CHAINS
KAPPA, LAMDA LIGHT CHAIN RATIO: 0.84 (ref 0.26–1.65)
Kappa free light chain: 27 mg/L — ABNORMAL HIGH (ref 3.3–19.4)
Lambda free light chains: 32 mg/L — ABNORMAL HIGH (ref 5.7–26.3)

## 2016-08-09 ENCOUNTER — Inpatient Hospital Stay: Payer: Commercial Managed Care - HMO

## 2016-08-09 ENCOUNTER — Ambulatory Visit: Payer: Commercial Managed Care - HMO | Admitting: Internal Medicine

## 2016-08-09 LAB — PROTEIN ELECTROPHORESIS, SERUM
A/G RATIO SPE: 0.8 (ref 0.7–1.7)
Albumin ELP: 3.1 g/dL (ref 2.9–4.4)
Alpha-1-Globulin: 0.3 g/dL (ref 0.0–0.4)
Alpha-2-Globulin: 1.2 g/dL — ABNORMAL HIGH (ref 0.4–1.0)
Beta Globulin: 1.2 g/dL (ref 0.7–1.3)
GLOBULIN, TOTAL: 3.7 g/dL (ref 2.2–3.9)
Gamma Globulin: 1 g/dL (ref 0.4–1.8)
M-Spike, %: 0.3 g/dL — ABNORMAL HIGH
TOTAL PROTEIN ELP: 6.8 g/dL (ref 6.0–8.5)

## 2016-08-09 LAB — PROTEIN ELECTRO, RANDOM URINE
ALBUMIN ELP UR: 30.9 %
ALPHA-1-GLOBULIN, U: 6 %
Alpha-2-Globulin, U: 18 %
Beta Globulin, U: 18.3 %
Gamma Globulin, U: 26.8 %
PDF: 0
Total Protein, Urine: 37.4 mg/dL

## 2016-08-09 LAB — IMMUNOFIXATION ELECTROPHORESIS
IGG (IMMUNOGLOBIN G), SERUM: 789 mg/dL (ref 700–1600)
IGM, SERUM: 449 mg/dL — AB (ref 26–217)
IgA: 543 mg/dL — ABNORMAL HIGH (ref 64–422)
TOTAL PROTEIN ELP: 7 g/dL (ref 6.0–8.5)

## 2016-08-10 ENCOUNTER — Telehealth: Payer: Self-pay | Admitting: Internal Medicine

## 2016-08-10 ENCOUNTER — Other Ambulatory Visit: Payer: Commercial Managed Care - HMO

## 2016-08-10 NOTE — Telephone Encounter (Signed)
Dr. Mortimer Fries please advise of the results of the cxr that was done on 08/07/16.  The pts daughter is calling for these results.  thanks

## 2016-08-10 NOTE — Telephone Encounter (Signed)
Daughter wants results from xray.

## 2016-08-13 NOTE — Telephone Encounter (Signed)
Called and spoke with pts daughter and she is aware of results per Dr. Mortimer Fries.

## 2016-08-13 NOTE — Telephone Encounter (Signed)
No new findings, just her fibrosis

## 2016-08-14 ENCOUNTER — Telehealth: Payer: Self-pay | Admitting: Family Medicine

## 2016-08-14 NOTE — Telephone Encounter (Signed)
Patient is being seen by Dr Grayland Ormond at the [facility] on 08/16/16. Carmell Austria is needing a [employer] referral. DX CODE:  D47.2 (P) [phone]

## 2016-08-14 NOTE — Telephone Encounter (Signed)
Referral has been approved [id number] from 08/16/16 through 02/11/2017.

## 2016-08-15 NOTE — Progress Notes (Signed)
[facility]  [phone] [phone]) [phone]  ID: Anita Atkins OB: [birth year]  MR#: [id number]  XHB#:[id number]  Patient Care Team: Arnetha Courser, MD as PCP - General (Family Medicine) Lloyd Huger, MD as Consulting Physician (Oncology) Flora Lipps, MD (Pulmonary Disease) Marlowe Sax, MD as Referring Physician (Internal Medicine) Julieanne Manson Leeanne Mannan., MD (Rheumatology) Josefine Class, MD as Referring Physician (Gastroenterology) [place] (Optometry) Minna Merritts, MD as Consulting Physician (Cardiology)  CHIEF COMPLAINT: MGUS  INTERVAL HISTORY: Patient returns to clinic for repeat laboratory work and further evaluation. She is not feeling well today. She is having severe breathing problems secondary to her pulmonary fibrosis. She continues to have pain secondary to her arthritis. She also has decreased appetite and weight loss. She is seeing a pulmonologist for her pulmonary issues and wishes to be considered to have a wheelchair due to deconditioning. She has no neurologic complaints. She denies any recent fevers or illnesses. She has no chest pain. She denies any nausea, vomiting, constipation, or diarrhea. She has no urinary complaints. Patient offers no further specific complaints today.  REVIEW OF SYSTEMS:   Review of Systems  Constitutional: Positive for weight loss. Negative for fever and malaise/fatigue.  Respiratory: Positive for shortness of breath.   Cardiovascular: Negative.  Negative for chest pain.  Gastrointestinal: Negative for abdominal pain and nausea.  Genitourinary: Negative.   Musculoskeletal: Positive for back pain, joint pain, myalgias and neck pain.  Neurological: Negative.  Negative for weakness.  Psychiatric/Behavioral: Negative.     As per HPI. Otherwise, a complete review of systems is negative.  PAST MEDICAL HISTORY: Past Medical History:  Diagnosis Date  . Diabetes mellitus without  complication ([place])   . High cholesterol   . Monoclonal paraproteinemia 01/31/2016  . Pulmonary fibrosis ([place])   . RA (rheumatoid arthritis) ([place])   . Shingles   . Status post bariatric surgery 02/13/2016    PAST SURGICAL HISTORY: Past Surgical History:  Procedure Laterality Date  . CARDIAC SURGERY    . CHOLECYSTECTOMY    . EXPLORATION POST OPERATIVE OPEN HEART    . GASTRIC BYPASS  2007    FAMILY HISTORY Family History  Problem Relation Age of Onset  . Heart disease Mother   . Stroke Mother   . Diabetes Sister   . Hyperlipidemia Sister   . Cancer Maternal Aunt     female  . COPD Neg Hx   . Hypertension Neg Hx        ADVANCED DIRECTIVES:    HEALTH MAINTENANCE: Social History  Substance Use Topics  . Smoking status: Never Smoker  . Smokeless tobacco: Never Used  . Alcohol use No     Colonoscopy:  PAP:  Bone density:  Lipid panel:  Allergies  Allergen Reactions  . Plaquenil [Hydroxychloroquine Sulfate] Rash    Current Outpatient Prescriptions  Medication Sig Dispense Refill  . acetaminophen (TYLENOL) 500 MG tablet Take 500 mg by mouth every 6 (six) hours as needed.     Marland Kitchen alendronate (FOSAMAX) 70 MG tablet Take 1 tablet by mouth once a week.    Marland Kitchen aspirin EC 81 MG tablet Take 81 mg by mouth daily.    Marland Kitchen azaTHIOprine (IMURAN) 50 MG tablet Take 50 mg by mouth daily.    Marland Kitchen azelastine (ASTELIN) 0.1 % nasal spray Place 1 spray into both nostrils 2 (two) times daily as needed.     . benzonatate (TESSALON) 100 MG capsule Take 1 capsule (100 mg total)  by mouth 3 (three) times daily as needed for cough. 120 capsule 6  . feeding supplement (BOOST / RESOURCE BREEZE) LIQD Take 1 Container by mouth 3 (three) times daily between meals. 237 mL 0  . glucose blood (ACCU-CHEK AVIVA) test strip Use as instructed to check sugars once a day; E11.9, LON 99 months 100 each 1  . ipratropium-albuterol (DUONEB) 0.5-2.5 (3) MG/3ML SOLN Take 3 mLs by nebulization every 4 (four) hours as  needed. 360 mL 10  . mirtazapine (REMERON) 15 MG tablet Take 1 tablet (15 mg total) by mouth at bedtime. 90 tablet 1  . OFEV 150 MG CAPS Take 150 mg by mouth 2 (two) times daily.     Marland Kitchen oxyCODONE-acetaminophen (ROXICET) 5-325 MG tablet Take 1 tablet by mouth every 4 (four) hours as needed. 30 tablet 0  . pantoprazole (PROTONIX) 20 MG tablet Take 1 tablet (20 mg total) by mouth daily. 90 tablet 8  . predniSONE (DELTASONE) 5 MG tablet Take 5 mg by mouth daily with breakfast. Take 1 to 1.5 by mouth daily for joint pain    . simvastatin (ZOCOR) 40 MG tablet Take 1 tablet (40 mg total) by mouth at bedtime. 90 tablet 3  . SYMBICORT 160-4.5 MCG/ACT inhaler Inhale 2 puffs into the lungs 2 (two) times daily. 1 Inhaler 5  . VENTOLIN HFA 108 (90 Base) MCG/ACT inhaler Inhale 2 puffs into the lungs every 6 (six) hours as needed. 1 Inhaler 2  . azithromycin (ZITHROMAX Z-PAK) 250 MG tablet Take 2 tablets on Day 1 and then 1 tablet daily till gone. (Patient not taking: Reported on 08/16/2016) 6 each 0  . magic mouthwash SOLN Take 5 mLs by mouth 4 (four) times daily as needed for mouth pain. (Patient not taking: Reported on 08/16/2016) 280 mL 0  . megestrol (MEGACE) 40 MG tablet Take 1 tablet (40 mg total) by mouth daily. 30 tablet 0  . traMADol (ULTRAM) 50 MG tablet Take 1 tablet (50 mg total) by mouth every 12 (twelve) hours as needed for moderate pain or severe pain. (Patient not taking: Reported on 08/16/2016) 20 tablet 0   No current facility-administered medications for this visit.     OBJECTIVE: Vitals:   08/16/16 1045  BP: 115/77  Pulse: (!) 111  Resp: 18  Temp: 98.1 F (36.7 C)     Body mass index is 16.94 kg/m.    ECOG FS:0 - Asymptomatic  General: Ill-appearing, no acute distress. Eyes: Pink conjunctiva, anicteric sclera. Lungs: Course breath sounds throughout. Heart: Regular rate and rhythm. No rubs, murmurs, or gallops. Abdomen: Soft, nontender, nondistended. No organomegaly noted,  normoactive bowel sounds. Musculoskeletal: No edema, cyanosis, or clubbing. Neuro: Alert, answering all questions appropriately. Cranial nerves grossly intact. Skin: No rashes or petechiae noted. Psych: Normal affect.  LAB RESULTS:  Lab Results  Component Value Date   NA 133 (L) 08/07/2016   K 3.5 08/07/2016   CL 96 (L) 08/07/2016   CO2 29 08/07/2016   GLUCOSE 224 (H) 08/07/2016   BUN 10 08/07/2016   CREATININE 0.53 08/07/2016   CALCIUM 8.8 (L) 08/07/2016   PROT 7.4 04/03/2016   ALBUMIN 3.3 (L) 04/03/2016   AST 39 04/03/2016   ALT 22 04/03/2016   ALKPHOS 104 04/03/2016   BILITOT 0.4 04/03/2016   GFRNONAA >60 08/07/2016   GFRAA >60 08/07/2016    Lab Results  Component Value Date   WBC 9.5 08/07/2016   NEUTROABS 7.5 (H) 08/07/2016   HGB 13.2 08/07/2016  HCT 37.5 08/07/2016   MCV 102.5 (H) 08/07/2016   PLT 332 08/07/2016   Lab Results  Component Value Date   TOTALPROTELP 6.8 08/07/2016   TOTALPROTELP 7.0 08/07/2016   ALBUMINELP 3.1 08/07/2016   A1GS 0.3 08/07/2016   A2GS 1.2 (H) 08/07/2016   BETS 1.2 08/07/2016   GAMS 1.0 08/07/2016   MSPIKE 0.3 (H) 08/07/2016   SPEI Comment 08/07/2016     STUDIES: Dg Chest 2 View  Result Date: 08/07/2016 CLINICAL DATA:  Chronic fatigue and shortness of breath. History of pulmonary fibrosis. Currently in no acute distress. EXAM: CHEST  2 VIEW COMPARISON:  CT scan chest of May 04, 2016 and PA and lateral chest x-ray of April 03, 2016. FINDINGS: The lungs are well-expanded. The interstitial markings are diffusely increased and stable. No air bronchograms are observed. There is no pleural effusion or pneumothorax. There is apical pleural thickening bilaterally which is stable. The heart and pulmonary vascularity are normal. There is calcification in the wall of the thoracic aorta. The sternal wires are intact. The observed bony thorax is unremarkable. IMPRESSION: Extensive changes of known pulmonary fibrosis. No acute  cardiopulmonary abnormality. Thoracic aortic atherosclerosis. Electronically Signed   By: David  Martinique M.D.   On: 08/07/2016 13:33    ASSESSMENT: MGUS.  PLAN:    1. MGUS: Patient's M spike is stable of 0.3. She does not have an M spike in her urine. The remainder of her laboratory work is either negative or within normal limits. She has no evidence of endorgan damage. No intervention is needed at this time. Patient does not require bone marrow biopsy. She does not need a metastatic bone survey at this time, but will consider one in the future if there is suspicion of progression of disease. Return to clinic in 6 months with repeat laboratory work and further evaluation. 2. Pulmonary fibrosis: Followed by Pulmonologist. Will need to contact to determine eligibility for wheelchair due to worsening breathing issues.  3. Pain: Patient states she has follow up with her Rheumatologist in the next several weeks. 4. Poor appetite: Patient was given a prescription for Megace to help stimulate appetite. Follow up with PCP if megace helps for future prescriptions.  5. Weight loss: Patients has been referred to a dietician by PCP. Continue with boosts and shakes to help with weight loss. Follow-up with PCP to see if Megace is helping.   Patient expressed understanding and was in agreement with this plan. She also understands that She can call clinic at any time with any questions, concerns, or complaints.   Faythe Casa, NP 08/16/2016  Patient was seen and evaluated independently and I agree with the assessment and plan as dictated above.  Lloyd Huger, MD 08/19/16 9:37 AM

## 2016-08-16 ENCOUNTER — Inpatient Hospital Stay (HOSPITAL_BASED_OUTPATIENT_CLINIC_OR_DEPARTMENT_OTHER): Payer: Commercial Managed Care - HMO | Admitting: Oncology

## 2016-08-16 VITALS — BP 115/77 | HR 111 | Temp 98.1°F | Resp 18 | Wt 88.2 lb

## 2016-08-16 DIAGNOSIS — D472 Monoclonal gammopathy: Secondary | ICD-10-CM | POA: Diagnosis not present

## 2016-08-16 DIAGNOSIS — Z79899 Other long term (current) drug therapy: Secondary | ICD-10-CM | POA: Diagnosis not present

## 2016-08-16 DIAGNOSIS — M069 Rheumatoid arthritis, unspecified: Secondary | ICD-10-CM | POA: Diagnosis not present

## 2016-08-16 DIAGNOSIS — R634 Abnormal weight loss: Secondary | ICD-10-CM | POA: Diagnosis not present

## 2016-08-16 DIAGNOSIS — E119 Type 2 diabetes mellitus without complications: Secondary | ICD-10-CM | POA: Diagnosis not present

## 2016-08-16 DIAGNOSIS — Z9884 Bariatric surgery status: Secondary | ICD-10-CM | POA: Diagnosis not present

## 2016-08-16 DIAGNOSIS — R630 Anorexia: Secondary | ICD-10-CM

## 2016-08-16 DIAGNOSIS — E7800 Pure hypercholesterolemia, unspecified: Secondary | ICD-10-CM | POA: Diagnosis not present

## 2016-08-16 DIAGNOSIS — J8410 Pulmonary fibrosis, unspecified: Secondary | ICD-10-CM | POA: Diagnosis not present

## 2016-08-16 MED ORDER — MEGESTROL ACETATE 40 MG PO TABS: 40.0000 mg | ORAL_TABLET | Freq: Every day | ORAL | 0 refills | Status: DC

## 2016-08-16 NOTE — Progress Notes (Signed)
Patient offers no complaints today. 

## 2016-09-11 ENCOUNTER — Telehealth: Payer: Self-pay

## 2016-09-11 ENCOUNTER — Telehealth: Payer: Self-pay | Admitting: Internal Medicine

## 2016-09-11 DIAGNOSIS — J8410 Pulmonary fibrosis, unspecified: Secondary | ICD-10-CM

## 2016-09-11 NOTE — Telephone Encounter (Signed)
This patient's daughter called wanting to renew her mom's referral with the providers listed below since they are about to expire.  Dr. Mortimer Fries (Pulmonologist) & Dr. Rockey Situ (Hematologist)  Please advise

## 2016-09-11 NOTE — Telephone Encounter (Signed)
Spoke with daughter and she is wanting pt to get seen. Weak and pain RT side and cough. Offered an appt today or tomorrow at the [facility] office and daughter refused. Informed if pt got worse she could take her to UC or ER. Did double book for next week on DR schedule. Nothing further needed.

## 2016-09-11 NOTE — Telephone Encounter (Signed)
Pt daughter calling stating pt is sick and has a constant cough  It is getting bad, patient ribs on her right side are hurting because of the cough Please advise.

## 2016-09-12 NOTE — Telephone Encounter (Signed)
I entered a new referral for Dr. Mortimer Fries, thank you She sees Dr. Rockey Situ (cardiology) and Dr. Grayland Ormond (hematology); which does she need? Thank you

## 2016-09-12 NOTE — Assessment & Plan Note (Signed)
Renew referral to Dr. Mortimer Fries, pulm

## 2016-09-15 ENCOUNTER — Encounter: Payer: Self-pay | Admitting: Emergency Medicine

## 2016-09-15 ENCOUNTER — Inpatient Hospital Stay
Admission: EM | Admit: 2016-09-15 | Discharge: 2016-09-18 | DRG: 197 | Disposition: A | Discharge status: Home Health | Payer: Medicare Other | Attending: Internal Medicine | Admitting: Internal Medicine

## 2016-09-15 ENCOUNTER — Emergency Department: Payer: Medicare Other

## 2016-09-15 DIAGNOSIS — R0600 Dyspnea, unspecified: Secondary | ICD-10-CM | POA: Diagnosis not present

## 2016-09-15 DIAGNOSIS — Z7982 Long term (current) use of aspirin: Secondary | ICD-10-CM

## 2016-09-15 DIAGNOSIS — Z7983 Long term (current) use of bisphosphonates: Secondary | ICD-10-CM | POA: Diagnosis not present

## 2016-09-15 DIAGNOSIS — M069 Rheumatoid arthritis, unspecified: Secondary | ICD-10-CM | POA: Diagnosis present

## 2016-09-15 DIAGNOSIS — L8990 Pressure ulcer of unspecified site, unspecified stage: Secondary | ICD-10-CM | POA: Insufficient documentation

## 2016-09-15 DIAGNOSIS — R0781 Pleurodynia: Secondary | ICD-10-CM | POA: Diagnosis not present

## 2016-09-15 DIAGNOSIS — E785 Hyperlipidemia, unspecified: Secondary | ICD-10-CM | POA: Diagnosis present

## 2016-09-15 DIAGNOSIS — E46 Unspecified protein-calorie malnutrition: Secondary | ICD-10-CM | POA: Diagnosis not present

## 2016-09-15 DIAGNOSIS — R627 Adult failure to thrive: Secondary | ICD-10-CM

## 2016-09-15 DIAGNOSIS — E1140 Type 2 diabetes mellitus with diabetic neuropathy, unspecified: Secondary | ICD-10-CM | POA: Diagnosis not present

## 2016-09-15 DIAGNOSIS — R64 Cachexia: Secondary | ICD-10-CM | POA: Diagnosis not present

## 2016-09-15 DIAGNOSIS — R0602 Shortness of breath: Secondary | ICD-10-CM | POA: Diagnosis not present

## 2016-09-15 DIAGNOSIS — J8410 Pulmonary fibrosis, unspecified: Secondary | ICD-10-CM | POA: Diagnosis not present

## 2016-09-15 DIAGNOSIS — J9610 Chronic respiratory failure, unspecified whether with hypoxia or hypercapnia: Secondary | ICD-10-CM | POA: Diagnosis not present

## 2016-09-15 DIAGNOSIS — R262 Difficulty in walking, not elsewhere classified: Secondary | ICD-10-CM

## 2016-09-15 DIAGNOSIS — I959 Hypotension, unspecified: Secondary | ICD-10-CM | POA: Diagnosis not present

## 2016-09-15 DIAGNOSIS — R531 Weakness: Secondary | ICD-10-CM | POA: Diagnosis not present

## 2016-09-15 DIAGNOSIS — I2510 Atherosclerotic heart disease of native coronary artery without angina pectoris: Secondary | ICD-10-CM | POA: Diagnosis present

## 2016-09-15 DIAGNOSIS — Z8249 Family history of ischemic heart disease and other diseases of the circulatory system: Secondary | ICD-10-CM

## 2016-09-15 DIAGNOSIS — Z9884 Bariatric surgery status: Secondary | ICD-10-CM

## 2016-09-15 DIAGNOSIS — Z681 Body mass index (BMI) 19 or less, adult: Secondary | ICD-10-CM

## 2016-09-15 DIAGNOSIS — Z9049 Acquired absence of other specified parts of digestive tract: Secondary | ICD-10-CM

## 2016-09-15 DIAGNOSIS — R269 Unspecified abnormalities of gait and mobility: Secondary | ICD-10-CM | POA: Diagnosis not present

## 2016-09-15 DIAGNOSIS — R000 Tachycardia, unspecified: Secondary | ICD-10-CM | POA: Diagnosis not present

## 2016-09-15 DIAGNOSIS — D472 Monoclonal gammopathy: Secondary | ICD-10-CM | POA: Diagnosis not present

## 2016-09-15 DIAGNOSIS — E876 Hypokalemia: Secondary | ICD-10-CM | POA: Diagnosis present

## 2016-09-15 DIAGNOSIS — R079 Chest pain, unspecified: Secondary | ICD-10-CM

## 2016-09-15 DIAGNOSIS — R0789 Other chest pain: Secondary | ICD-10-CM | POA: Diagnosis not present

## 2016-09-15 DIAGNOSIS — E7800 Pure hypercholesterolemia, unspecified: Secondary | ICD-10-CM | POA: Diagnosis not present

## 2016-09-15 DIAGNOSIS — E1165 Type 2 diabetes mellitus with hyperglycemia: Secondary | ICD-10-CM | POA: Diagnosis present

## 2016-09-15 DIAGNOSIS — Z823 Family history of stroke: Secondary | ICD-10-CM

## 2016-09-15 DIAGNOSIS — Z833 Family history of diabetes mellitus: Secondary | ICD-10-CM

## 2016-09-15 DIAGNOSIS — J9611 Chronic respiratory failure with hypoxia: Secondary | ICD-10-CM | POA: Diagnosis not present

## 2016-09-15 DIAGNOSIS — M6281 Muscle weakness (generalized): Secondary | ICD-10-CM

## 2016-09-15 DIAGNOSIS — Z951 Presence of aortocoronary bypass graft: Secondary | ICD-10-CM

## 2016-09-15 DIAGNOSIS — T380X5A Adverse effect of glucocorticoids and synthetic analogues, initial encounter: Secondary | ICD-10-CM | POA: Diagnosis present

## 2016-09-15 DIAGNOSIS — Z7951 Long term (current) use of inhaled steroids: Secondary | ICD-10-CM

## 2016-09-15 DIAGNOSIS — Z79818 Long term (current) use of other agents affecting estrogen receptors and estrogen levels: Secondary | ICD-10-CM

## 2016-09-15 DIAGNOSIS — R0902 Hypoxemia: Secondary | ICD-10-CM

## 2016-09-15 DIAGNOSIS — Z7952 Long term (current) use of systemic steroids: Secondary | ICD-10-CM

## 2016-09-15 DIAGNOSIS — Z888 Allergy status to other drugs, medicaments and biological substances status: Secondary | ICD-10-CM

## 2016-09-15 DIAGNOSIS — E860 Dehydration: Secondary | ICD-10-CM | POA: Diagnosis present

## 2016-09-15 LAB — BASIC METABOLIC PANEL
Anion gap: 9 (ref 5–15)
BUN: 21 mg/dL — AB (ref 6–20)
CALCIUM: 9.1 mg/dL (ref 8.9–10.3)
CO2: 26 mmol/L (ref 22–32)
CREATININE: 0.44 mg/dL (ref 0.44–1.00)
Chloride: 105 mmol/L (ref 101–111)
GFR calc non Af Amer: >60 mL/min (ref >60)
GLUCOSE: 181 mg/dL — AB (ref 65–99)
Potassium: 3.9 mmol/L (ref 3.5–5.1)
Sodium: 140 mmol/L (ref 135–145)

## 2016-09-15 LAB — CBC
HCT: 36.9 % (ref 35.0–47.0)
Hemoglobin: 13.2 g/dL (ref 12.0–16.0)
MCH: 37 pg — AB (ref 26.0–34.0)
MCHC: 35.9 g/dL (ref 32.0–36.0)
MCV: 103.2 fL — ABNORMAL HIGH (ref 80.0–100.0)
PLATELETS: 241 10*3/uL (ref 150–440)
RBC: 3.58 MIL/uL — AB (ref 3.80–5.20)
RDW: 14.2 % (ref 11.5–14.5)
WBC: 7.4 10*3/uL (ref 3.6–11.0)

## 2016-09-15 LAB — TROPONIN I

## 2016-09-15 NOTE — ED Triage Notes (Signed)
Patient with complaint of shortness of breath and bilateral rib pain that started earlier in the week. Daughter reports that patient has similar pain in the past when she had pneumonia. Daughter states that she checked the patient's orthostatic vitals and the patient became dizzy with standing and systolic bp was int he [id number]. Patient has a history of pulmonary fibrosis.

## 2016-09-16 ENCOUNTER — Inpatient Hospital Stay: Payer: Medicare Other

## 2016-09-16 ENCOUNTER — Encounter: Payer: Self-pay | Admitting: Internal Medicine

## 2016-09-16 DIAGNOSIS — I959 Hypotension, unspecified: Secondary | ICD-10-CM | POA: Diagnosis not present

## 2016-09-16 DIAGNOSIS — E1165 Type 2 diabetes mellitus with hyperglycemia: Secondary | ICD-10-CM | POA: Diagnosis not present

## 2016-09-16 DIAGNOSIS — J8410 Pulmonary fibrosis, unspecified: Secondary | ICD-10-CM | POA: Diagnosis not present

## 2016-09-16 DIAGNOSIS — R0602 Shortness of breath: Secondary | ICD-10-CM

## 2016-09-16 DIAGNOSIS — E1140 Type 2 diabetes mellitus with diabetic neuropathy, unspecified: Secondary | ICD-10-CM | POA: Diagnosis not present

## 2016-09-16 DIAGNOSIS — E46 Unspecified protein-calorie malnutrition: Secondary | ICD-10-CM | POA: Diagnosis not present

## 2016-09-16 DIAGNOSIS — R64 Cachexia: Secondary | ICD-10-CM | POA: Diagnosis not present

## 2016-09-16 DIAGNOSIS — R0600 Dyspnea, unspecified: Secondary | ICD-10-CM | POA: Diagnosis present

## 2016-09-16 DIAGNOSIS — D472 Monoclonal gammopathy: Secondary | ICD-10-CM | POA: Diagnosis not present

## 2016-09-16 DIAGNOSIS — Z681 Body mass index (BMI) 19 or less, adult: Secondary | ICD-10-CM | POA: Diagnosis not present

## 2016-09-16 DIAGNOSIS — J9611 Chronic respiratory failure with hypoxia: Secondary | ICD-10-CM | POA: Diagnosis not present

## 2016-09-16 LAB — CBC
HEMATOCRIT: 37 % (ref 35.0–47.0)
Hemoglobin: 12.8 g/dL (ref 12.0–16.0)
MCH: 36.7 pg — ABNORMAL HIGH (ref 26.0–34.0)
MCHC: 34.6 g/dL (ref 32.0–36.0)
MCV: 106.1 fL — AB (ref 80.0–100.0)
PLATELETS: 200 10*3/uL (ref 150–440)
RBC: 3.49 MIL/uL — AB (ref 3.80–5.20)
RDW: 14.7 % — AB (ref 11.5–14.5)
WBC: 8.6 10*3/uL (ref 3.6–11.0)

## 2016-09-16 LAB — BASIC METABOLIC PANEL
Anion gap: 8 (ref 5–15)
BUN: 18 mg/dL (ref 6–20)
CHLORIDE: 108 mmol/L (ref 101–111)
CO2: 24 mmol/L (ref 22–32)
CREATININE: 0.58 mg/dL (ref 0.44–1.00)
Calcium: 8.3 mg/dL — ABNORMAL LOW (ref 8.9–10.3)
GFR calc Af Amer: >60 mL/min (ref >60)
GFR calc non Af Amer: >60 mL/min (ref >60)
Glucose, Bld: 230 mg/dL — ABNORMAL HIGH (ref 65–99)
POTASSIUM: 3.4 mmol/L — AB (ref 3.5–5.1)
Sodium: 140 mmol/L (ref 135–145)

## 2016-09-16 LAB — GLUCOSE, CAPILLARY
GLUCOSE-CAPILLARY: 98 mg/dL (ref 65–99)
Glucose-Capillary: 154 mg/dL — ABNORMAL HIGH (ref 65–99)
Glucose-Capillary: 323 mg/dL — ABNORMAL HIGH (ref 65–99)
Glucose-Capillary: 320 mg/dL — ABNORMAL HIGH (ref 65–99)
Glucose-Capillary: 200 mg/dL — ABNORMAL HIGH (ref 65–99)

## 2016-09-16 MED ORDER — MIRTAZAPINE 15 MG PO TABS
15.0000 mg | ORAL_TABLET | Freq: Every day | ORAL | Status: DC
  Administered 2016-09-16 – 2016-09-17 (×2): 15 mg via ORAL
  Filled 2016-09-16 (×2): qty 1

## 2016-09-16 MED ORDER — INSULIN ASPART 100 UNIT/ML ~~LOC~~ SOLN
0.0000 [IU] | Freq: Three times a day (TID) | SUBCUTANEOUS | Status: DC
  Administered 2016-09-16 (×2): 15 [IU] via SUBCUTANEOUS
  Administered 2016-09-17: 7 [IU] via SUBCUTANEOUS
  Administered 2016-09-17: 4 [IU] via SUBCUTANEOUS
  Administered 2016-09-17: 3 [IU] via SUBCUTANEOUS
  Administered 2016-09-18 (×2): 4 [IU] via SUBCUTANEOUS
  Filled 2016-09-16: qty 4
  Filled 2016-09-16: qty 7
  Filled 2016-09-16: qty 3
  Filled 2016-09-16: qty 15
  Filled 2016-09-16: qty 4
  Filled 2016-09-16: qty 15
  Filled 2016-09-16: qty 4

## 2016-09-16 MED ORDER — IOPAMIDOL (ISOVUE-370) INJECTION 76%
75.0000 mL | Freq: Once | INTRAVENOUS | Status: AC | PRN
  Administered 2016-09-16: 75 mL via INTRAVENOUS

## 2016-09-16 MED ORDER — IPRATROPIUM-ALBUTEROL 0.5-2.5 (3) MG/3ML IN SOLN
RESPIRATORY_TRACT | Status: AC
  Administered 2016-09-16: 3 mL via RESPIRATORY_TRACT
  Filled 2016-09-16: qty 3

## 2016-09-16 MED ORDER — ALENDRONATE SODIUM 70 MG PO TABS: 70.0000 mg | ORAL_TABLET | ORAL | Status: DC

## 2016-09-16 MED ORDER — IPRATROPIUM-ALBUTEROL 0.5-2.5 (3) MG/3ML IN SOLN
3.0000 mL | Freq: Once | RESPIRATORY_TRACT | Status: AC
  Administered 2016-09-16: 3 mL via RESPIRATORY_TRACT
  Filled 2016-09-16: qty 3

## 2016-09-16 MED ORDER — AZATHIOPRINE 50 MG PO TABS
50.0000 mg | ORAL_TABLET | Freq: Every day | ORAL | Status: DC
  Administered 2016-09-16: 50 mg via ORAL
  Filled 2016-09-16: qty 1

## 2016-09-16 MED ORDER — BOOST / RESOURCE BREEZE PO LIQD
1.0000 | Freq: Three times a day (TID) | ORAL | Status: DC
  Administered 2016-09-16 – 2016-09-17 (×2): 1 via ORAL

## 2016-09-16 MED ORDER — SENNOSIDES-DOCUSATE SODIUM 8.6-50 MG PO TABS: 1.0000 | ORAL_TABLET | Freq: Every evening | ORAL | Status: DC | PRN

## 2016-09-16 MED ORDER — SIMVASTATIN 20 MG PO TABS: 40.0000 mg | ORAL_TABLET | Freq: Every day | ORAL | Status: DC

## 2016-09-16 MED ORDER — INSULIN ASPART 100 UNIT/ML ~~LOC~~ SOLN
0.0000 [IU] | Freq: Every day | SUBCUTANEOUS | Status: DC
  Administered 2016-09-17: 3 [IU] via SUBCUTANEOUS
  Filled 2016-09-16: qty 3

## 2016-09-16 MED ORDER — SODIUM CHLORIDE 0.9 % IV SOLN
INTRAVENOUS | Status: DC
  Administered 2016-09-16: 03:00:00 via INTRAVENOUS

## 2016-09-16 MED ORDER — ACETAMINOPHEN 650 MG RE SUPP: 650.0000 mg | Freq: Four times a day (QID) | RECTAL | Status: DC | PRN

## 2016-09-16 MED ORDER — ONDANSETRON HCL 4 MG/2ML IJ SOLN: 4.0000 mg | Freq: Four times a day (QID) | INTRAMUSCULAR | Status: DC | PRN

## 2016-09-16 MED ORDER — ENOXAPARIN SODIUM 30 MG/0.3ML ~~LOC~~ SOLN
30.0000 mg | Freq: Every day | SUBCUTANEOUS | Status: DC
  Administered 2016-09-16 – 2016-09-18 (×3): 30 mg via SUBCUTANEOUS
  Filled 2016-09-16 (×3): qty 0.3

## 2016-09-16 MED ORDER — MEGESTROL ACETATE 40 MG PO TABS
40.0000 mg | ORAL_TABLET | Freq: Every day | ORAL | Status: DC
  Administered 2016-09-16 – 2016-09-18 (×3): 40 mg via ORAL
  Filled 2016-09-16 (×3): qty 1

## 2016-09-16 MED ORDER — INSULIN ASPART 100 UNIT/ML ~~LOC~~ SOLN
3.0000 [IU] | Freq: Three times a day (TID) | SUBCUTANEOUS | Status: DC
  Administered 2016-09-16 – 2016-09-18 (×7): 3 [IU] via SUBCUTANEOUS
  Filled 2016-09-16 (×6): qty 3

## 2016-09-16 MED ORDER — METHYLPREDNISOLONE SODIUM SUCC 125 MG IJ SOLR
INTRAMUSCULAR | Status: AC
  Administered 2016-09-16: 60 mg via INTRAVENOUS
  Filled 2016-09-16: qty 2

## 2016-09-16 MED ORDER — NINTEDANIB ESYLATE 150 MG PO CAPS
150.0000 mg | ORAL_CAPSULE | Freq: Two times a day (BID) | ORAL | Status: DC
  Administered 2016-09-17 – 2016-09-18 (×2): 150 mg via ORAL
  Filled 2016-09-16 (×3): qty 1

## 2016-09-16 MED ORDER — SODIUM CHLORIDE 0.9 % IV BOLUS (SEPSIS)
500.0000 mL | Freq: Once | INTRAVENOUS | Status: AC
  Administered 2016-09-16: 500 mL via INTRAVENOUS

## 2016-09-16 MED ORDER — OXYCODONE-ACETAMINOPHEN 5-325 MG PO TABS: 1.0000 | ORAL_TABLET | ORAL | Status: DC | PRN

## 2016-09-16 MED ORDER — PANTOPRAZOLE SODIUM 40 MG PO TBEC
40.0000 mg | DELAYED_RELEASE_TABLET | Freq: Every day | ORAL | Status: DC
  Administered 2016-09-16 – 2016-09-18 (×3): 40 mg via ORAL
  Filled 2016-09-16 (×3): qty 1

## 2016-09-16 MED ORDER — AZELASTINE HCL 0.1 % NA SOLN
1.0000 | Freq: Two times a day (BID) | NASAL | Status: DC
  Administered 2016-09-16 – 2016-09-18 (×3): 1 via NASAL
  Filled 2016-09-16 (×2): qty 30

## 2016-09-16 MED ORDER — ONDANSETRON HCL 4 MG PO TABS: 4.0000 mg | ORAL_TABLET | Freq: Four times a day (QID) | ORAL | Status: DC | PRN

## 2016-09-16 MED ORDER — DEXTROSE 5 % IV SOLN
1.0000 g | INTRAVENOUS | Status: DC
  Administered 2016-09-16: 1 g via INTRAVENOUS
  Filled 2016-09-16 (×3): qty 10

## 2016-09-16 MED ORDER — METHYLPREDNISOLONE SODIUM SUCC 125 MG IJ SOLR
60.0000 mg | Freq: Four times a day (QID) | INTRAMUSCULAR | Status: DC
  Administered 2016-09-16 (×2): 60 mg via INTRAVENOUS
  Filled 2016-09-16: qty 2

## 2016-09-16 MED ORDER — METHYLPREDNISOLONE SODIUM SUCC 125 MG IJ SOLR
60.0000 mg | Freq: Every day | INTRAMUSCULAR | Status: DC
  Administered 2016-09-17: 60 mg via INTRAVENOUS
  Filled 2016-09-16: qty 2

## 2016-09-16 MED ORDER — BENZONATATE 100 MG PO CAPS: 100.0000 mg | ORAL_CAPSULE | Freq: Three times a day (TID) | ORAL | Status: DC | PRN

## 2016-09-16 MED ORDER — ACETAMINOPHEN 325 MG PO TABS: 650.0000 mg | ORAL_TABLET | Freq: Four times a day (QID) | ORAL | Status: DC | PRN

## 2016-09-16 MED ORDER — ASPIRIN EC 81 MG PO TBEC
81.0000 mg | DELAYED_RELEASE_TABLET | Freq: Every day | ORAL | Status: DC
Start: 2016-09-16 — End: 2016-09-18
  Administered 2016-09-16 – 2016-09-18 (×3): 81 mg via ORAL
  Filled 2016-09-16 (×3): qty 1

## 2016-09-16 MED ORDER — DILTIAZEM HCL ER COATED BEADS 120 MG PO CP24
120.0000 mg | ORAL_CAPSULE | Freq: Every day | ORAL | Status: DC
  Administered 2016-09-16 – 2016-09-17 (×2): 120 mg via ORAL
  Filled 2016-09-16 (×2): qty 1

## 2016-09-16 MED ORDER — IPRATROPIUM-ALBUTEROL 0.5-2.5 (3) MG/3ML IN SOLN
3.0000 mL | Freq: Four times a day (QID) | RESPIRATORY_TRACT | Status: DC
  Administered 2016-09-16 – 2016-09-18 (×7): 3 mL via RESPIRATORY_TRACT
  Filled 2016-09-16 (×8): qty 3

## 2016-09-16 NOTE — Progress Notes (Signed)
.  Anita Atkins     ASSESSMENT/PLAN   Patient with end-stage pulmonary fibrosis and severe cachexia. Agree with Solu-Medrol, she is presently on OFEV. Would empirically place patient on broad-spectrum antibiotics as she has been on immunosuppressive therapy such as Imuran and immune modulator such as OFEV. Would hold Imuran while she is in the hospital. Has severe pulmonary cachexia, as you're doing nutritional support. We'll be glad to follow with you  Hyperglycemia. Most likely steroid-induced area be managed by hospitalist    Name: Anita Atkins MRN: [id number] DOB: 1935/04/26    ADMISSION DATE:  09/15/2016 [place] DATE:  09/16/16  REFERRING MD :  Dr. Estanislado Pandy  CHIEF COMPLAINT:  Shortness of breath   HISTORY OF PRESENT ILLNESS:  Anita Atkins is a very pleasant 82 year old Caucasian female with a past medical history remarkable for rheumatoid arthritis, coronary artery disease, status post bypass surgery, monoclonal paraproteinemia, hyperlipidemia, diabetes with proximally 4 year history of pulmonary fibrosis. Her present medical regimen includes Nintedanib(OFEV). She presented with increasing shortness of breath, cough, generally dry nonproductive sputum although she did have some sputum productivity last week. She is not on home oxygen therapy she has not qualified in the past. She does complain of significant weight loss and failure to thrive  PAST MEDICAL HISTORY :  Past Medical History:  Diagnosis Date  . Diabetes mellitus without complication ([place])   . High cholesterol   . Monoclonal paraproteinemia 01/31/2016  . Pulmonary fibrosis ([place])   . RA (rheumatoid arthritis) ([place])   . Shingles   . Status post bariatric surgery 02/13/2016   Past Surgical History:  Procedure Laterality Date  . CARDIAC SURGERY    . CHOLECYSTECTOMY    . EXPLORATION POST OPERATIVE OPEN HEART    . GASTRIC BYPASS  2007   Prior to Admission medications   Medication  Sig Start Date End Date Taking? Authorizing Provider  acetaminophen (TYLENOL) 500 MG tablet Take 500 mg by mouth every 6 (six) hours as needed.    Yes Historical Provider, MD  alendronate (FOSAMAX) 70 MG tablet Take 1 tablet by mouth once a week. 11/11/15  Yes Historical Provider, MD  aspirin EC 81 MG tablet Take 81 mg by mouth daily.   Yes Historical Provider, MD  azaTHIOprine (IMURAN) 50 MG tablet Take 50 mg by mouth daily. 03/21/16  Yes Historical Provider, MD  benzonatate (TESSALON) 100 MG capsule Take 1 capsule (100 mg total) by mouth 3 (three) times daily as needed for cough. 05/22/16  Yes Flora Lipps, MD  feeding supplement (BOOST / RESOURCE BREEZE) LIQD Take 1 Container by mouth 3 (three) times daily between meals. 03/08/16  Yes Vaughan Basta, MD  glucose blood (ACCU-CHEK AVIVA) test strip Use as instructed to check sugars once a day; E11.9, LON 99 months 03/13/16  Yes Arnetha Courser, MD  ipratropium-albuterol (DUONEB) 0.5-2.5 (3) MG/3ML SOLN Take 3 mLs by nebulization every 4 (four) hours as needed. 05/22/16  Yes Flora Lipps, MD  megestrol (MEGACE) 40 MG tablet Take 40 mg by mouth daily.   Yes Historical Provider, MD  mirtazapine (REMERON) 15 MG tablet Take 1 tablet (15 mg total) by mouth at bedtime. 02/13/16  Yes Arnetha Courser, MD  OFEV 150 MG CAPS Take 150 mg by mouth 2 (two) times daily.  07/25/15  Yes Historical Provider, MD  oxyCODONE-acetaminophen (ROXICET) 5-325 MG tablet Take 1 tablet by mouth every 4 (four) hours as needed. 08/07/16  Yes Flora Lipps, MD  pantoprazole ([place]) 20  MG tablet Take 1 tablet (20 mg total) by mouth daily. 12/07/15 12/06/16 Yes Flora Lipps, MD  predniSONE (DELTASONE) 5 MG tablet Take 5 mg by mouth daily with breakfast. Take 1 to 1.5 by mouth daily for joint pain   Yes Historical Provider, MD  simvastatin (ZOCOR) 40 MG tablet Take 1 tablet (40 mg total) by mouth at bedtime. 08/17/15  Yes Minna Merritts, MD  SYMBICORT 160-4.5 MCG/ACT inhaler Inhale 2  puffs into the lungs 2 (two) times daily. 04/12/16  Yes Flora Lipps, MD  VENTOLIN HFA 108 (90 Base) MCG/ACT inhaler Inhale 2 puffs into the lungs every 6 (six) hours as needed. 04/12/16  Yes Flora Lipps, MD  azelastine (ASTELIN) 0.1 % nasal spray Place 1 spray into both nostrils 2 (two) times daily as needed.  03/16/15   Historical Provider, MD   Allergies  Allergen Reactions  . Plaquenil [Hydroxychloroquine Sulfate] Rash    FAMILY HISTORY:  Family History  Problem Relation Age of Onset  . Heart disease Mother   . Stroke Mother   . Diabetes Sister   . Hyperlipidemia Sister   . Cancer Maternal Aunt     female  . COPD Neg Hx   . Hypertension Neg Hx    SOCIAL HISTORY:  reports that she has never smoked. She has never used smokeless tobacco. She reports that she does not drink alcohol or use drugs.  REVIEW OF SYSTEMS:   Constitutional: Feels generally poor chronically ill Cardiovascular: She does complain of right-sided costal pleuritic type chest pain especially worse with deep inspiration and motion Pulmonary: See history of present illness   The remainder of systems were reviewed and were found to be negative other than what is documented in the HPI.    VITAL SIGNS: Temp:  [96.3 F (35.7 C)-99.9 F (37.7 C)] 96.3 F (35.7 C) (01/21 1405) Pulse Rate:  [94-122] 122 (01/21 1405) Resp:  [16-31] 17 (01/21 0729) BP: (116-140)/(61-95) 135/82 (01/21 1405) SpO2:  [92 %-100 %] 100 % (01/21 1405) Weight:  [38.4 kg (84 lb 11.2 oz)-38.6 kg (85 lb)] 38.4 kg (84 lb 11.2 oz) (01/21 0316)   INTAKE / OUTPUT:  Intake/Output Summary (Last 24 hours) at 09/16/16 1415 Last data filed at 09/16/16 1039  Gross per 24 hour  Intake           356.25 ml  Output                0 ml  Net           356.25 ml    Physical Examination:   VS: BP 135/82 (BP Location: Left Arm)   Pulse (!) 122   Temp (!) 96.3 F (35.7 C) (Axillary)   Resp 17   Ht 5' (1.524 m)   Wt 38.4 kg (84 lb 11.2 oz)   SpO2  100%   BMI 16.54 kg/m   General Appearance: No distress, Patient is severely wasted cachectic and appears chronically ill Neuro:without focal findings, mental status, speech normal,. HEENT: PERRLA, EOM intact, no ptosis, no other lesions noticed;  Pulmonary: Bibasilar inspiratory crackles appreciated diffusely CardiovascularNormal S1,S2. S4 appreciated, systolic murmur left parasternal border  Abdomen: Benign, Soft, non-tender, No masses, hepatosplenomegaly, No lymphadenopathy Renal:  No costovertebral tenderness  GU:  Not performed at this time. Endoc: No evident thyromegaly, no signs of acromegaly. Skin:   warm, no rashes, no ecchymosis  Extremities: normal, no cyanosis, mild clubbing noted   LABORATORY PANEL:   CBC  Recent Labs Lab  09/16/16 0402  WBC 8.6  HGB 12.8  HCT 37.0  PLT 200    Chemistries   Recent Labs Lab 09/16/16 0402  NA 140  K 3.4*  CL 108  CO2 24  GLUCOSE 230*  BUN 18  CREATININE 0.58  CALCIUM 8.3*     Recent Labs Lab 09/16/16 0154 09/16/16 0833 09/16/16 1123  GLUCAP 154* 323* 320*   No results for input(s): PHART, PCO2ART, PO2ART in the last 168 hours. No results for input(s): AST, ALT, ALKPHOS, BILITOT, ALBUMIN in the last 168 hours.  Cardiac Enzymes  Recent Labs Lab 09/15/16 2249  TROPONINI <0.03    RADIOLOGY:  Ct Angio Chest Pe W Or Wo Contrast  Result Date: 09/16/2016 CLINICAL DATA:  Shortness of breath, cough, and bilateral rib pain. History of pulmonary fibrosis. EXAM: CT ANGIOGRAPHY CHEST WITH CONTRAST TECHNIQUE: Multidetector CT imaging of the chest was performed using the standard protocol during bolus administration of intravenous contrast. Multiplanar CT image reconstructions and MIPs were obtained to evaluate the vascular anatomy. CONTRAST:  75 mL Isovue 370 COMPARISON:  High-resolution chest CT 05/04/2016 FINDINGS: Cardiovascular: Pulmonary arterial opacification is adequate without evidence of emboli. There is thoracic  aortic atherosclerosis without evidence of aneurysm or dissection. Prior CABG is noted with LAD and right coronary artery atherosclerosis. The heart is normal in size. No pericardial effusion. Mediastinum/Nodes: No enlarged axillary, mediastinal, or hilar lymph nodes are identified. The thyroid and esophagus are grossly unremarkable. Minimal secretions are noted in the trachea. Lungs/Pleura: No pleural effusion or pneumothorax. Extensive subpleural reticulation is again seen throughout both lungs with traction bronchiectasis and bibasilar honeycombing, not significantly changed. No acute airspace consolidation or mass is identified. Upper Abdomen: Prior cholecystectomy. Prior gastric bypass. Unchanged low density left adrenal gland thickening. Musculoskeletal: No chest wall mass. There is a transverse nondisplaced fracture through the inferior aspect of the right scapula with callus formation, new from the prior CT. Review of the MIP images confirms the above findings. IMPRESSION: 1. No evidence of pulmonary emboli. 2. Unchanged advanced fibrotic interstitial lung disease compatible with usual interstitial pneumonia. 3. Healing nondisplaced inferior right scapular fracture. 4. Aortic atherosclerosis. Electronically Signed   By: Logan Bores M.D.   On: 09/16/2016 13:45   Dg Chest Port 1 View  Result Date: 09/15/2016 CLINICAL DATA:  82 y/o  F; shortness of breath. EXAM: PORTABLE CHEST 1 VIEW COMPARISON:  08/07/2016 chest radiograph FINDINGS: Coarse reticular markings of the lungs is compatible with pulmonary fibrosis. No focal consolidation. No pleural effusion. Stable normal cardiac silhouette. Sternotomy wires are aligned. Right upper quadrant surgical clips, presumably cholecystectomy. No acute osseous abnormality is identified. IMPRESSION: Pulmonary fibrosis.  No focal consolidation. Electronically Signed   By: Kristine Garbe M.D.   On: 09/15/2016 23:35    Hermelinda Dellen, DO ICU Pager  [phone] [facility] Office Number: [phone]  Patricia Pesa, M.D.  Vilinda Boehringer, M.D.  Merton Border, M.D   09/16/2016, 2:15 PM

## 2016-09-16 NOTE — H&P (Signed)
[facility] NAME: Anita Atkins    MR#:  [id number]  DATE OF BIRTH:  [birth year]  DATE OF ADMISSION:  09/15/2016  PRIMARY CARE PHYSICIAN: Enid Derry, MD   REQUESTING/REFERRING PHYSICIAN:   CHIEF COMPLAINT:   Chief Complaint  Patient presents with  . Shortness of Breath  . Chest Pain    HISTORY OF PRESENT ILLNESS: Anita Atkins  is a 82 y.o. female with a known history of diabetes mellitus type 2, hyperlipidemia, monoclonal paraproteinemia, pulmonary fibrosis, rheumatoid arthritis presented to the emergency room with increased shortness of breath for the last 1 week. Patient has pulmonary fibrosis for a long time and has periods of increased difficulty breathing. Has no cough and no fever or chills. The shortness of breath is more on exertion. No complaints of any chest discomfort. Patient was evaluated with chest x-ray showed no pneumonia. No history of recent travel or sick contacts at home. Patient feels weak and dehydrated. Has poor oral appetite. Hospitalist service was consulted for further care of the patient.  PAST MEDICAL HISTORY:   Past Medical History:  Diagnosis Date  . Diabetes mellitus without complication ([place])   . High cholesterol   . Monoclonal paraproteinemia 01/31/2016  . Pulmonary fibrosis ([place])   . RA (rheumatoid arthritis) ([place])   . Shingles   . Status post bariatric surgery 02/13/2016    PAST SURGICAL HISTORY: Past Surgical History:  Procedure Laterality Date  . CARDIAC SURGERY    . CHOLECYSTECTOMY    . EXPLORATION POST OPERATIVE OPEN HEART    . GASTRIC BYPASS  2007    SOCIAL HISTORY:  Social History  Substance Use Topics  . Smoking status: Never Smoker  . Smokeless tobacco: Never Used  . Alcohol use No    FAMILY HISTORY:  Family History  Problem Relation Age of Onset  . Heart disease Mother   . Stroke Mother   . Diabetes Sister   . Hyperlipidemia Sister   . Cancer Maternal Aunt      female  . COPD Neg Hx   . Hypertension Neg Hx     DRUG ALLERGIES:  Allergies  Allergen Reactions  . Plaquenil [Hydroxychloroquine Sulfate] Rash    REVIEW OF SYSTEMS:   CONSTITUTIONAL: No fever, has fatigue and weakness.  EYES: No blurred or double vision.  EARS, NOSE, AND THROAT: No tinnitus or ear pain.  RESPIRATORY: No cough,  Has shortness of breath,  No wheezing or hemoptysis.  CARDIOVASCULAR: No chest pain, orthopnea, edema.  GASTROINTESTINAL: No nausea, vomiting, diarrhea or abdominal pain.  GENITOURINARY: No dysuria, hematuria.  ENDOCRINE: No polyuria, nocturia,  HEMATOLOGY: No anemia, easy bruising or bleeding SKIN: No rash or lesion. MUSCULOSKELETAL: No joint pain or arthritis.   NEUROLOGIC: No tingling, numbness, weakness.  PSYCHIATRY: No anxiety or depression.   MEDICATIONS AT HOME:  Prior to Admission medications   Medication Sig Start Date End Date Taking? Authorizing Provider  acetaminophen (TYLENOL) 500 MG tablet Take 500 mg by mouth every 6 (six) hours as needed.    Yes Historical Provider, MD  alendronate (FOSAMAX) 70 MG tablet Take 1 tablet by mouth once a week. 11/11/15  Yes Historical Provider, MD  aspirin EC 81 MG tablet Take 81 mg by mouth daily.   Yes Historical Provider, MD  azaTHIOprine (IMURAN) 50 MG tablet Take 50 mg by mouth daily. 03/21/16  Yes Historical Provider, MD  benzonatate (TESSALON) 100 MG capsule Take 1 capsule (100 mg total) by  mouth 3 (three) times daily as needed for cough. 05/22/16  Yes Flora Lipps, MD  feeding supplement (BOOST / RESOURCE BREEZE) LIQD Take 1 Container by mouth 3 (three) times daily between meals. 03/08/16  Yes Vaughan Basta, MD  glucose blood (ACCU-CHEK AVIVA) test strip Use as instructed to check sugars once a day; E11.9, LON 99 months 03/13/16  Yes Arnetha Courser, MD  ipratropium-albuterol (DUONEB) 0.5-2.5 (3) MG/3ML SOLN Take 3 mLs by nebulization every 4 (four) hours as needed. 05/22/16  Yes Flora Lipps, MD   megestrol (MEGACE) 40 MG tablet Take 40 mg by mouth daily.   Yes Historical Provider, MD  mirtazapine (REMERON) 15 MG tablet Take 1 tablet (15 mg total) by mouth at bedtime. 02/13/16  Yes Arnetha Courser, MD  OFEV 150 MG CAPS Take 150 mg by mouth 2 (two) times daily.  07/25/15  Yes Historical Provider, MD  oxyCODONE-acetaminophen (ROXICET) 5-325 MG tablet Take 1 tablet by mouth every 4 (four) hours as needed. 08/07/16  Yes Flora Lipps, MD  pantoprazole (PROTONIX) 20 MG tablet Take 1 tablet (20 mg total) by mouth daily. 12/07/15 12/06/16 Yes Flora Lipps, MD  predniSONE (DELTASONE) 5 MG tablet Take 5 mg by mouth daily with breakfast. Take 1 to 1.5 by mouth daily for joint pain   Yes Historical Provider, MD  simvastatin (ZOCOR) 40 MG tablet Take 1 tablet (40 mg total) by mouth at bedtime. 08/17/15  Yes Minna Merritts, MD  SYMBICORT 160-4.5 MCG/ACT inhaler Inhale 2 puffs into the lungs 2 (two) times daily. 04/12/16  Yes Flora Lipps, MD  VENTOLIN HFA 108 (90 Base) MCG/ACT inhaler Inhale 2 puffs into the lungs every 6 (six) hours as needed. 04/12/16  Yes Flora Lipps, MD  azelastine (ASTELIN) 0.1 % nasal spray Place 1 spray into both nostrils 2 (two) times daily as needed.  03/16/15   Historical Provider, MD      PHYSICAL EXAMINATION:   VITAL SIGNS: Blood pressure 116/75, pulse 99, temperature 99.9 F (37.7 C), temperature source Rectal, resp. rate (!) 24, height 5' (1.524 m), weight 38.6 kg (85 lb), SpO2 100 %.  GENERAL:  82 y.o.-year-old cachectic appearing patient lying in the bed with no acute distress.  EYES: Pupils equal, round, reactive to light and accommodation. No scleral icterus. Extraocular muscles intact.  HEENT: Head atraumatic, normocephalic. Oropharynx dry and nasopharynx clear.  NECK:  Supple, no jugular venous distention. No thyroid enlargement, no tenderness.  LUNGS: Decreased breath sounds bilaterally, no wheezing, rales,rhonchi or crepitation. No use of accessory muscles of  respiration.  CARDIOVASCULAR: S1, S2 normal. No murmurs, rubs, or gallops.  ABDOMEN: Soft, nontender, nondistended. Bowel sounds present. No organomegaly or mass.  EXTREMITIES: No pedal edema, cyanosis, or clubbing.  NEUROLOGIC: Cranial nerves II through XII are intact. Muscle strength 5/5 in all extremities. Sensation intact. Gait not checked.  PSYCHIATRIC: The patient is alert and oriented x 3.  SKIN: No obvious rash, lesion, or ulcer.   LABORATORY PANEL:   CBC  Recent Labs Lab 09/15/16 2249  WBC 7.4  HGB 13.2  HCT 36.9  PLT 241  MCV 103.2*  MCH 37.0*  MCHC 35.9  RDW 14.2   ------------------------------------------------------------------------------------------------------------------  Chemistries   Recent Labs Lab 09/15/16 2249  NA 140  K 3.9  CL 105  CO2 26  GLUCOSE 181*  BUN 21*  CREATININE 0.44  CALCIUM 9.1   ------------------------------------------------------------------------------------------------------------------ estimated creatinine clearance is 34.7 mL/min (by C-G formula based on SCr of 0.44 mg/dL). ------------------------------------------------------------------------------------------------------------------ No results  for input(s): TSH, T4TOTAL, T3FREE, THYROIDAB in the last 72 hours.  Invalid input(s): FREET3   Coagulation profile No results for input(s): INR, PROTIME in the last 168 hours. ------------------------------------------------------------------------------------------------------------------- No results for input(s): DDIMER in the last 72 hours. -------------------------------------------------------------------------------------------------------------------  Cardiac Enzymes  Recent Labs Lab 09/15/16 2249  TROPONINI <0.03   ------------------------------------------------------------------------------------------------------------------ Invalid input(s):  POCBNP  ---------------------------------------------------------------------------------------------------------------  Urinalysis    Component Value Date/Time   COLORURINE YELLOW (A) 03/07/2016 0157   APPEARANCEUR CLEAR (A) 03/07/2016 0157   LABSPEC 1.014 03/07/2016 0157   PHURINE 6.0 03/07/2016 0157   GLUCOSEU 150 (A) 03/07/2016 0157   HGBUR NEGATIVE 03/07/2016 0157   BILIRUBINUR NEGATIVE 03/07/2016 0157   KETONESUR 1+ (A) 03/07/2016 0157   PROTEINUR NEGATIVE 03/07/2016 0157   NITRITE NEGATIVE 03/07/2016 0157   LEUKOCYTESUR NEGATIVE 03/07/2016 0157     RADIOLOGY: Dg Chest Port 1 View  Result Date: 09/15/2016 CLINICAL DATA:  82 y/o  F; shortness of breath. EXAM: PORTABLE CHEST 1 VIEW COMPARISON:  08/07/2016 chest radiograph FINDINGS: Coarse reticular markings of the lungs is compatible with pulmonary fibrosis. No focal consolidation. No pleural effusion. Stable normal cardiac silhouette. Sternotomy wires are aligned. Right upper quadrant surgical clips, presumably cholecystectomy. No acute osseous abnormality is identified. IMPRESSION: Pulmonary fibrosis.  No focal consolidation. Electronically Signed   By: Kristine Garbe M.D.   On: 09/15/2016 23:35    EKG: Orders placed or performed during the hospital encounter of 09/15/16  . ED EKG within 10 minutes  . ED EKG within 10 minutes  . EKG 12-Lead  . EKG 12-Lead  . EKG 12-Lead  . EKG 12-Lead    IMPRESSION AND PLAN: 82 year old female patient with history of pulmonary fibrosis, rheumatoid arthritis, paraproteinemia type 2 diabetes mellitus presented to the emergency room with increased shortness of breath. Admitting diagnosis 1. Dyspnea secondary to worsening pulmonary fibrosis 2. Pulmonary fibrosis 3. Dehydration 4. Adult failure to thrive 5. Type 2 diabetes mellitus 6. Rheumatoid arthritis Treatment plan Admit patient to medical floor IV Solu-Medrol 60 mg every 6 hourly Nebulization treatment  aggressively Appetite stimulants IV fluid hydration for dehydration DVT prophylaxis subcutaneous Lovenox 40 MG daily Medical management for diabetes mellitus Supportive care.  All the records are reviewed and case discussed with ED provider. Management plans discussed with the patient, family and they are in agreement.  CODE STATUS:FULL CODE Surveyor, minerals : Daughter Code Status History    Date Active Date Inactive Code Status Order ID Comments User Context   03/06/2016  9:51 AM 03/08/2016  1:12 PM Full Code [id number]  Vaughan Basta, MD Inpatient       TOTAL TIME TAKING CARE OF THIS PATIENT: 52 minutes.    Saundra Shelling M.D on 09/16/2016 at 1:43 AM  Between 7am to 6pm - Pager - [phone]  After 6pm go to [email or web address] - password [email or web address] [facility]  Office  [phone]  CC: Primary care physician; Enid Derry, MD

## 2016-09-16 NOTE — ED Notes (Signed)
Resting quietly at this time, family at bedside.  No complaints at this time.  Awaiting inpatient bed assignment.

## 2016-09-16 NOTE — Progress Notes (Signed)

## 2016-09-16 NOTE — Progress Notes (Signed)
Chaplain received an order to visit with pt in room 146. Provided information regarding an Scientist, physiological.    09/16/16 1006  Clinical Encounter Type  Visited With Patient  Visit Type Initial  Referral From Nurse  Spiritual Encounters  Spiritual Needs Other (Comment)

## 2016-09-16 NOTE — ED Provider Notes (Signed)
[facility] Emergency Department Provider Note   ____________________________________________   First MD Initiated Contact with Patient 09/15/16 2324     (approximate)  I have reviewed the triage vital signs and the nursing notes.   HISTORY  Chief Complaint Shortness of Breath and Chest Pain    HPI Anita Atkins is a 82 y.o. female who presents to the ED from home with a chief complaint of shortness of breath and bilateral rib pain. Patient has a history of pulmonary fibrosis, not on home oxygen, who reports increasing shortness of breath and loose rattly cough 3-4 days. Daughter reports patient has had similar symptoms in the past when she had pneumonia. Daughter reports she checked the patient's orthostatic vital signs and patient became dizzy with standing with a systolic BP in the [id number]. Patient complains of increased dyspnea on exertion with minimal movements. Denies associated fever, chills, abdominal pain, nausea, vomiting, diarrhea. Reports decreased appetite more than usual with increasing generalized weakness. Denies recent travel or trauma. Nothing makes her symptoms better.   Past Medical History:  Diagnosis Date  . Diabetes mellitus without complication ([place])   . High cholesterol   . Monoclonal paraproteinemia 01/31/2016  . Pulmonary fibrosis ([place])   . RA (rheumatoid arthritis) ([place])   . Shingles   . Status post bariatric surgery 02/13/2016    Patient Active Problem List   Diagnosis Date Noted  . Weakness 04/06/2016  . Right lower lobe pneumonia ([place]) 03/12/2016  . Pneumonia 03/06/2016  . Protein-calorie malnutrition, severe 03/06/2016  . Status post bariatric surgery 02/13/2016  . MGUS (monoclonal gammopathy of unknown significance) 01/31/2016  . Elevated sedimentation rate 08/15/2015  . History of ITP 07/18/2015  . Abnormal SPEP 07/18/2015  . Diabetic neuropathy ([place]) 07/13/2015  . Macrocytosis 07/13/2015  . Depression, major, single  episode, mild ([place]) 07/13/2015  . Chronic diarrhea 07/13/2015  . Cataracts, bilateral 06/20/2015  . Hyperkalemia 06/17/2015  . Cough, persistent 06/15/2015  . Rheumatoid arthritis ([place]) 06/15/2015  . Osteoporosis 06/15/2015  . Pulmonary fibrosis ([place]) 06/15/2015  . Type 2 diabetes mellitus without complication, without long-term current use of insulin ([place]) 06/15/2015  . Coronary artery disease involving native coronary artery of native heart without angina pectoris 06/15/2015  . Vitamin D deficiency 06/15/2015  . High blood cholesterol 06/15/2015  . Fatigue 06/15/2015  . Bariatric surgery status 06/15/2015  . Needs flu shot 06/15/2015  . Need for prophylactic vaccination with Streptococcus pneumoniae (Pneumococcus) and Influenza vaccines 06/15/2015    Past Surgical History:  Procedure Laterality Date  . CARDIAC SURGERY    . CHOLECYSTECTOMY    . EXPLORATION POST OPERATIVE OPEN HEART    . GASTRIC BYPASS  2007    Prior to Admission medications   Medication Sig Start Date End Date Taking? Authorizing Provider  acetaminophen (TYLENOL) 500 MG tablet Take 500 mg by mouth every 6 (six) hours as needed.     Historical Provider, MD  alendronate (FOSAMAX) 70 MG tablet Take 1 tablet by mouth once a week. 11/11/15   Historical Provider, MD  aspirin EC 81 MG tablet Take 81 mg by mouth daily.    Historical Provider, MD  azaTHIOprine (IMURAN) 50 MG tablet Take 50 mg by mouth daily. 03/21/16   Historical Provider, MD  azelastine (ASTELIN) 0.1 % nasal spray Place 1 spray into both nostrils 2 (two) times daily as needed.  03/16/15   Historical Provider, MD  azithromycin (ZITHROMAX Z-PAK) 250 MG tablet Take 2 tablets on Day 1 and then  1 tablet daily till gone. Patient not taking: Reported on 08/16/2016 08/07/16   Flora Lipps, MD  benzonatate (TESSALON) 100 MG capsule Take 1 capsule (100 mg total) by mouth 3 (three) times daily as needed for cough. 05/22/16   Flora Lipps, MD  feeding supplement (BOOST /  RESOURCE BREEZE) LIQD Take 1 Container by mouth 3 (three) times daily between meals. 03/08/16   Vaughan Basta, MD  glucose blood (ACCU-CHEK AVIVA) test strip Use as instructed to check sugars once a day; E11.9, LON 99 months 03/13/16   Arnetha Courser, MD  ipratropium-albuterol (DUONEB) 0.5-2.5 (3) MG/3ML SOLN Take 3 mLs by nebulization every 4 (four) hours as needed. 05/22/16   Flora Lipps, MD  magic mouthwash SOLN Take 5 mLs by mouth 4 (four) times daily as needed for mouth pain. Patient not taking: Reported on 08/16/2016 03/12/16   Arnetha Courser, MD  mirtazapine (REMERON) 15 MG tablet Take 1 tablet (15 mg total) by mouth at bedtime. 02/13/16   Arnetha Courser, MD  OFEV 150 MG CAPS Take 150 mg by mouth 2 (two) times daily.  07/25/15   Historical Provider, MD  oxyCODONE-acetaminophen (ROXICET) 5-325 MG tablet Take 1 tablet by mouth every 4 (four) hours as needed. 08/07/16   Flora Lipps, MD  pantoprazole (PROTONIX) 20 MG tablet Take 1 tablet (20 mg total) by mouth daily. 12/07/15 12/06/16  Flora Lipps, MD  predniSONE (DELTASONE) 5 MG tablet Take 5 mg by mouth daily with breakfast. Take 1 to 1.5 by mouth daily for joint pain    Historical Provider, MD  simvastatin (ZOCOR) 40 MG tablet Take 1 tablet (40 mg total) by mouth at bedtime. 08/17/15   Minna Merritts, MD  SYMBICORT 160-4.5 MCG/ACT inhaler Inhale 2 puffs into the lungs 2 (two) times daily. 04/12/16   Flora Lipps, MD  traMADol (ULTRAM) 50 MG tablet Take 1 tablet (50 mg total) by mouth every 12 (twelve) hours as needed for moderate pain or severe pain. Patient not taking: Reported on 08/16/2016 03/08/16   Vaughan Basta, MD  VENTOLIN HFA 108 (90 Base) MCG/ACT inhaler Inhale 2 puffs into the lungs every 6 (six) hours as needed. 04/12/16   Flora Lipps, MD    Allergies Plaquenil [hydroxychloroquine sulfate]  Family History  Problem Relation Age of Onset  . Heart disease Mother   . Stroke Mother   . Diabetes Sister   . Hyperlipidemia  Sister   . Cancer Maternal Aunt     female  . COPD Neg Hx   . Hypertension Neg Hx     Social History Social History  Substance Use Topics  . Smoking status: Never Smoker  . Smokeless tobacco: Never Used  . Alcohol use No    Review of Systems  Constitutional: Positive for generalized weakness. No fever/chills Eyes: No visual changes. ENT: No sore throat. Cardiovascular: Positive for chest pain. Respiratory: Positive for cough and shortness of breath. Gastrointestinal: No abdominal pain.  No nausea, no vomiting.  No diarrhea.  No constipation. Genitourinary: Negative for dysuria. Musculoskeletal: Negative for back pain. Skin: Negative for rash. Neurological: Negative for headaches, focal weakness or numbness.  10-point ROS otherwise negative.  ____________________________________________   PHYSICAL EXAM:  VITAL SIGNS: ED Triage Vitals  Enc Vitals Group     BP 09/15/16 2218 124/67     Pulse Rate 09/15/16 2218 (!) 109     Resp 09/15/16 2218 18     Temp 09/15/16 2249 99.9 F (37.7 C)  Temp Source 09/15/16 2249 Rectal     SpO2 09/15/16 2218 94 %     Weight 09/15/16 2218 85 lb (38.6 kg)     Height 09/15/16 2218 5' (1.524 m)     Head Circumference --      Peak Flow --      Pain Score 09/15/16 2249 2     Pain Loc --      Pain Edu? --      Excl. in [place]? --     Constitutional: Alert and oriented. Cachetic and in mild acute distress. Eyes: Conjunctivae are normal. PERRL. EOMI. Head: Atraumatic. Nose: No congestion/rhinnorhea. Mouth/Throat: Mucous membranes are moist.  Oropharynx non-erythematous. Neck: No stridor.   Cardiovascular: Tachycardic rate, regular rhythm. Grossly normal heart sounds.  Good peripheral circulation. Respiratory: Increased respiratory effort.  No retractions. Lungs with scattered rhonchi. Gastrointestinal: Soft and nontender. No distention. No abdominal bruits. No CVA tenderness. Musculoskeletal: No lower extremity tenderness nor edema.  No  joint effusions. Neurologic:  Normal speech and language. No gross focal neurologic deficits are appreciated.  Skin:  Skin is warm, dry and intact. No rash noted. Psychiatric: Mood and affect are normal. Speech and behavior are normal.  ____________________________________________   LABS (all labs ordered are listed, but only abnormal results are displayed)  Labs Reviewed  BASIC METABOLIC PANEL - Abnormal; Notable for the following:       Result Value   Glucose, Bld 181 (*)    BUN 21 (*)    All other components within normal limits  CBC - Abnormal; Notable for the following:    RBC 3.58 (*)    MCV 103.2 (*)    MCH 37.0 (*)    All other components within normal limits  TROPONIN I   ____________________________________________  EKG  ED ECG REPORT I, SUNG,JADE J, the attending physician, personally viewed and interpreted this ECG.   Date: 09/16/2016  EKG Time: 2227  Rate: 106  Rhythm: sinus tachycardia  Axis: Normal  Intervals:none  ST&T Change: Nonspecific  ____________________________________________  RADIOLOGY  Portable chest x-ray (viewed by me, interpreted per Dr. Toney Reil): Pulmonary fibrosis. No focal consolidation. ____________________________________________   PROCEDURES  Procedure(s) performed: None  Procedures  Critical Care performed: No  ____________________________________________   INITIAL IMPRESSION / ASSESSMENT AND PLAN / ED COURSE  Pertinent labs & imaging results that were available during my care of the patient were reviewed by me and considered in my medical decision making (see chart for details).  82 year old female with pulmonary fibrosis who presents with increased cough, shortness of breath and generalized weakness for the past several days. Patient is tachypneic just speaking and sitting up for auscultation. Updated patient and her daughter on laboratory and imaging results. Will initiate DuoNeb, IV fluid resuscitation, check  urinalysis which may be contributing to patient's generalized weakness. Will discuss with hospitalist to evaluate patient in the emergency department for admission.      ____________________________________________   FINAL CLINICAL IMPRESSION(S) / ED DIAGNOSES  Final diagnoses:  Shortness of breath  Pulmonary fibrosis (HCC)  Hypoxia  Chest wall pain  Weakness  Adult failure to thrive syndrome      NEW MEDICATIONS STARTED DURING THIS VISIT:  New Prescriptions   No medications on file     Note:  This document was prepared using Dragon voice recognition software and may include unintentional dictation errors.    Paulette Blanch, MD 09/16/16 [phone]

## 2016-09-16 NOTE — ED Notes (Signed)
Pt transported to room 146 

## 2016-09-16 NOTE — Progress Notes (Signed)
Physical Therapy Evaluation Patient Details Name: Anita Atkins MRN: [id number] DOB: [birth year] Today's Date: 09/16/2016   History of Present Illness  Patient is a 82 y.o. female admitted on 62 JAN for increasing SOB x1 week. PMH includes DMII, hyperlipidemia, monoclonal paraproteinemia, pulmonary fibrosis, and RA.   Clinical Impression  Patient is a pleasant 82 y.o. Female who presents to hospital from daughter's house for above listed reasons. Upon evaluation, patient's resting HR found to fluctuate between 115-117 bpm with oxygen saturations WNL. Upon ambulation assessment, patient's HR decreased to 90 bpm; however, she noted increased fatigue with 30'. Patient admittedly has history of falls and acknowledges that she does not use ADs at home. PT educated patient about importance of ADs to prevent falls with injury and encouraged patient to be accepting of HHPT to allow for safe progressions in gait training, as her HR can impact activity/function. Because patient is not at baseline level of function and is limited by cardiopulmonary endurance, it is believed that she will continue to benefit from progressive PT to improve dynamic balance and mobility and prevent future exacerbations.    Follow Up Recommendations Home health PT    Equipment Recommendations  None recommended by PT    Recommendations for Other Services       Precautions / Restrictions Precautions Precautions: Fall Restrictions Weight Bearing Restrictions: No      Mobility  Bed Mobility Overal bed mobility: Modified Independent             General bed mobility comments: Patient moves from supine to sit with HOB elevated.  Transfers Overall transfer level: Modified independent Equipment used: Rolling walker (2 wheeled)             General transfer comment: Patient moves from sit to stand and stand to sit with supervision, demonstrating good safety awareness.  Ambulation/Gait Ambulation/Gait assistance:  Min guard Ambulation Distance (Feet): 30 Feet Assistive device: Rolling walker (2 wheeled)       General Gait Details: Patient ambulates at decreased cadence and increased fatigue with progressive distance.  Stairs            Wheelchair Mobility    Modified Rankin (Stroke Patients Only)       Balance Overall balance assessment: Needs assistance;History of Falls Sitting-balance support: Feet supported Sitting balance-Leahy Scale: Good     Standing balance support: Bilateral upper extremity supported Standing balance-Leahy Scale: Good                               Pertinent Vitals/Pain Pain Assessment: Faces Faces Pain Scale: Hurts little more Pain Location: Ribs Pain Descriptors / Indicators: Aching Pain Intervention(s): Limited activity within patient's tolerance;Monitored during session    Home Living Family/patient expects to be discharged to:: Private residence Living Arrangements: Children Available Help at Discharge: Family;Available PRN/intermittently Type of Home: House Home Access: Ramped entrance     Home Layout: One level Home Equipment: Walker - 2 wheels;Walker - 4 wheels;Cane - quad;Cane - single point;Tub bench;Hospital bed      Prior Function Level of Independence: Needs assistance   Gait / Transfers Assistance Needed: Performed with modified independence for household distances; admits to furniture walking and not using AD  ADL's / Homemaking Assistance Needed: Performed by daughter        Hand Dominance        Extremity/Trunk Assessment   Upper Extremity Assessment Upper Extremity Assessment: Generalized weakness    Lower Extremity  Assessment Lower Extremity Assessment: Generalized weakness       Communication   Communication: No difficulties  Cognition Arousal/Alertness: Awake/alert Behavior During Therapy: WFL for tasks assessed/performed Overall Cognitive Status: Within Functional Limits for tasks  assessed                      General Comments      Exercises     Assessment/Plan    PT Assessment Patient needs continued PT services  PT Problem List Decreased strength;Decreased activity tolerance;Decreased balance;Decreased mobility;Decreased knowledge of use of DME;Cardiopulmonary status limiting activity;Pain;Decreased safety awareness          PT Treatment Interventions DME instruction;Gait training;Functional mobility training;Therapeutic activities;Therapeutic exercise;Balance training;Patient/family education    PT Goals (Current goals can be found in the Care Plan section)  Acute Rehab PT Goals Patient Stated Goal: "To go home" PT Goal Formulation: With patient Time For Goal Achievement: 09/30/16 Potential to Achieve Goals: Good    Frequency Min 2X/week   Barriers to discharge        Co-evaluation               End of Session Equipment Utilized During Treatment: Gait belt Activity Tolerance: Patient tolerated treatment well;Patient limited by fatigue Patient left: in chair;with call bell/phone within reach;with chair alarm set;Other (comment) (w/respiratory therapist)           Time: 1445-1500 PT Time Calculation (min) (ACUTE ONLY): 15 min   Charges:   PT Evaluation $PT Eval Low Complexity: 1 Procedure     PT G Codes:        Dorice Lamas, PT, DPT 09/16/2016, 3:09 PM

## 2016-09-16 NOTE — Progress Notes (Signed)
Patient ID: Anita Atkins, female   DOB: 01/14/1935, 82 y.o.   MRN: [id number]   ACP Note  Discussion with patient  Dx Pulmonary fibrosis with poor exercise capacity at baseline, Rheumatoid arthritis  Code status discussed with patient. She would like to be a full code at this point. She will discuss things with her family in February and is leaning towards comfort at that time if things dont improve.  17 minutes with ACP discussion  Dr Leslye Peer

## 2016-09-16 NOTE — Progress Notes (Signed)
Patient ID: Anita Atkins, female   DOB: 05-14-1935, 82 y.o.   MRN: [id number]  [facility] PROGRESS NOTE  Anita Atkins [id number] DOB: 10/12/1934 DOA: 09/15/2016 PCP: Enid Derry, MD  HPI/Subjective: Patient not feeling well.  Very short of breath with limited activity even while she is at rest. She is having pain in bilateral ribs that was unbearable.  Objective: Vitals:   09/16/16 0729 09/16/16 1405  BP: 125/68 135/82  Pulse: (!) 105 (!) 122  Resp: 17   Temp: 97.6 F (36.4 C) (!) 96.3 F (35.7 C)    Filed Weights   09/15/16 2218 09/16/16 0316  Weight: 38.6 kg (85 lb) 38.4 kg (84 lb 11.2 oz)    ROS: Review of Systems  Constitutional: Negative for chills and fever.  Eyes: Negative for blurred vision.  Respiratory: Positive for cough, shortness of breath and wheezing.   Cardiovascular: Negative for chest pain.  Gastrointestinal: Negative for abdominal pain, constipation, diarrhea, nausea and vomiting.  Genitourinary: Negative for dysuria.  Musculoskeletal: Negative for joint pain.  Neurological: Negative for dizziness and headaches.   Exam: Physical Exam  Constitutional: She is oriented to person, place, and time.  HENT:  Nose: No mucosal edema.  Mouth/Throat: No oropharyngeal exudate or posterior oropharyngeal edema.  Eyes: Conjunctivae, EOM and lids are normal. Pupils are equal, round, and reactive to light.  Neck: No JVD present. Carotid bruit is not present. No edema present. No thyroid mass and no thyromegaly present.  Cardiovascular: S1 normal and S2 normal.  Tachycardia present.  Exam reveals no gallop.   No murmur heard. Pulses:      Dorsalis pedis pulses are 2+ on the right side, and 2+ on the left side.  Respiratory: No respiratory distress. She has decreased breath sounds in the right middle field, the right lower field, the left middle field and the left lower field. She has no wheezes. She has rhonchi in the right lower field and the left lower field.  She has no rales.  GI: Soft. Bowel sounds are normal. There is no tenderness.  Musculoskeletal:       Right ankle: She exhibits no swelling.       Left ankle: She exhibits no swelling.  Lymphadenopathy:    She has no cervical adenopathy.  Neurological: She is alert and oriented to person, place, and time. No cranial nerve deficit.  Skin: Skin is warm. No rash noted. Nails show no clubbing.  Psychiatric: She has a normal mood and affect.      Data Reviewed: Basic Metabolic Panel:  Recent Labs Lab 09/15/16 2249 09/16/16 0402  NA 140 140  K 3.9 3.4*  CL 105 108  CO2 26 24  GLUCOSE 181* 230*  BUN 21* 18  CREATININE 0.44 0.58  CALCIUM 9.1 8.3*   CBC:  Recent Labs Lab 09/15/16 2249 09/16/16 0402  WBC 7.4 8.6  HGB 13.2 12.8  HCT 36.9 37.0  MCV 103.2* 106.1*  PLT 241 200   Cardiac Enzymes:  Recent Labs Lab 09/15/16 2249  TROPONINI <0.03    CBG:  Recent Labs Lab 09/16/16 0154 09/16/16 0833 09/16/16 1123  GLUCAP 154* 323* 320*       Studies: Ct Angio Chest Pe W Or Wo Contrast  Result Date: 09/16/2016 CLINICAL DATA:  Shortness of breath, cough, and bilateral rib pain. History of pulmonary fibrosis. EXAM: CT ANGIOGRAPHY CHEST WITH CONTRAST TECHNIQUE: Multidetector CT imaging of the chest was performed using the standard protocol during bolus administration of intravenous contrast. Multiplanar  CT image reconstructions and MIPs were obtained to evaluate the vascular anatomy. CONTRAST:  75 mL Isovue 370 COMPARISON:  High-resolution chest CT 05/04/2016 FINDINGS: Cardiovascular: Pulmonary arterial opacification is adequate without evidence of emboli. There is thoracic aortic atherosclerosis without evidence of aneurysm or dissection. Prior CABG is noted with LAD and right coronary artery atherosclerosis. The heart is normal in size. No pericardial effusion. Mediastinum/Nodes: No enlarged axillary, mediastinal, or hilar lymph nodes are identified. The thyroid and  esophagus are grossly unremarkable. Minimal secretions are noted in the trachea. Lungs/Pleura: No pleural effusion or pneumothorax. Extensive subpleural reticulation is again seen throughout both lungs with traction bronchiectasis and bibasilar honeycombing, not significantly changed. No acute airspace consolidation or mass is identified. Upper Abdomen: Prior cholecystectomy. Prior gastric bypass. Unchanged low density left adrenal gland thickening. Musculoskeletal: No chest wall mass. There is a transverse nondisplaced fracture through the inferior aspect of the right scapula with callus formation, new from the prior CT. Review of the MIP images confirms the above findings. IMPRESSION: 1. No evidence of pulmonary emboli. 2. Unchanged advanced fibrotic interstitial lung disease compatible with usual interstitial pneumonia. 3. Healing nondisplaced inferior right scapular fracture. 4. Aortic atherosclerosis. Electronically Signed   By: Logan Bores M.D.   On: 09/16/2016 13:45   Dg Chest Port 1 View  Result Date: 09/15/2016 CLINICAL DATA:  82 y/o  F; shortness of breath. EXAM: PORTABLE CHEST 1 VIEW COMPARISON:  08/07/2016 chest radiograph FINDINGS: Coarse reticular markings of the lungs is compatible with pulmonary fibrosis. No focal consolidation. No pleural effusion. Stable normal cardiac silhouette. Sternotomy wires are aligned. Right upper quadrant surgical clips, presumably cholecystectomy. No acute osseous abnormality is identified. IMPRESSION: Pulmonary fibrosis.  No focal consolidation. Electronically Signed   By: Kristine Garbe M.D.   On: 09/15/2016 23:35    Scheduled Meds: . aspirin EC  81 mg Oral Daily  . azaTHIOprine  50 mg Oral Daily  . azelastine  1 spray Each Nare BID  . enoxaparin (LOVENOX) injection  30 mg Subcutaneous Daily  . feeding supplement  1 Container Oral TID BM  . insulin aspart  0-20 Units Subcutaneous TID WC  . insulin aspart  0-5 Units Subcutaneous QHS  . insulin  aspart  3 Units Subcutaneous TID WC  . ipratropium-albuterol  3 mL Nebulization Q6H  . megestrol  40 mg Oral Daily  . [START ON 09/17/2016] methylPREDNISolone (SOLU-MEDROL) injection  60 mg Intravenous Daily  . mirtazapine  15 mg Oral QHS  . Nintedanib  150 mg Oral BID  . pantoprazole  40 mg Oral QAC breakfast    Assessment/Plan:  1. Pulmonary fibrosis exacerbation. Continue nebulizer treatments. Decrease Solu-Medrol down to 60 mg IV daily. CT scan of the chest that I ordered was negative for pulmonary embolism. Empiric Rocephin. Patient is a full code. Check pulse oximetry with ambulation. 2. History of rheumatoid arthritis on immunosuppressants 3. History of bariatric surgery 4. Diabetes with hyperglycemia with steroids. Dropped down status of steroids and have on sliding scale 5. Malnutrition on Megace 6. Hypokalemia replace potassium orally 7. Tachycardia. Start Cardizem CD at night  Code Status:     Code Status Orders        Start     Ordered   09/16/16 0310  Full code  Continuous     09/16/16 0309    Code Status History    Date Active Date Inactive Code Status Order ID Comments User Context   03/06/2016  9:51 AM 03/08/2016  1:12 PM Full  Code [id number]  Vaughan Basta, MD Inpatient      Disposition Plan: Home once breathing better  Consultants:  Pulmonary  Antibiotics:  Rocephin  Time spent: 32 minutes  Loletha Grayer  [employer]

## 2016-09-16 NOTE — Progress Notes (Signed)
Pharmacist - Prescriber Communication  Enoxaparin dose modified to 30 mg subcutaneously once daily for low body weight.  Missi Mcmackin [address]. [address], [place].D., BCPS Clinical Pharmacist 09/16/2016 [phone]

## 2016-09-16 NOTE — Progress Notes (Signed)
Dr Leslye Peer on rounds,md orders d/c ivf and cardiac monitoring

## 2016-09-16 NOTE — ED Notes (Signed)
ED Provider at bedside. 

## 2016-09-17 DIAGNOSIS — R64 Cachexia: Secondary | ICD-10-CM | POA: Diagnosis not present

## 2016-09-17 DIAGNOSIS — E1140 Type 2 diabetes mellitus with diabetic neuropathy, unspecified: Secondary | ICD-10-CM | POA: Diagnosis not present

## 2016-09-17 DIAGNOSIS — D472 Monoclonal gammopathy: Secondary | ICD-10-CM | POA: Diagnosis not present

## 2016-09-17 DIAGNOSIS — J8410 Pulmonary fibrosis, unspecified: Secondary | ICD-10-CM | POA: Diagnosis not present

## 2016-09-17 DIAGNOSIS — R0600 Dyspnea, unspecified: Secondary | ICD-10-CM

## 2016-09-17 DIAGNOSIS — E46 Unspecified protein-calorie malnutrition: Secondary | ICD-10-CM | POA: Diagnosis not present

## 2016-09-17 DIAGNOSIS — J9611 Chronic respiratory failure with hypoxia: Secondary | ICD-10-CM | POA: Diagnosis not present

## 2016-09-17 DIAGNOSIS — L8990 Pressure ulcer of unspecified site, unspecified stage: Secondary | ICD-10-CM | POA: Insufficient documentation

## 2016-09-17 DIAGNOSIS — I959 Hypotension, unspecified: Secondary | ICD-10-CM | POA: Diagnosis not present

## 2016-09-17 DIAGNOSIS — Z681 Body mass index (BMI) 19 or less, adult: Secondary | ICD-10-CM | POA: Diagnosis not present

## 2016-09-17 DIAGNOSIS — E1165 Type 2 diabetes mellitus with hyperglycemia: Secondary | ICD-10-CM | POA: Diagnosis not present

## 2016-09-17 LAB — URINALYSIS, COMPLETE (UACMP) WITH MICROSCOPIC
Bilirubin Urine: NEGATIVE
Glucose, UA: 50 mg/dL — AB
Hgb urine dipstick: NEGATIVE
Ketones, ur: NEGATIVE mg/dL
Nitrite: NEGATIVE
PH: 6 (ref 5.0–8.0)
Protein, ur: NEGATIVE mg/dL
SPECIFIC GRAVITY, URINE: 1.027 (ref 1.005–1.030)

## 2016-09-17 LAB — GLUCOSE, CAPILLARY
GLUCOSE-CAPILLARY: 278 mg/dL — AB (ref 65–99)
Glucose-Capillary: 152 mg/dL — ABNORMAL HIGH (ref 65–99)
Glucose-Capillary: 248 mg/dL — ABNORMAL HIGH (ref 65–99)
Glucose-Capillary: 137 mg/dL — ABNORMAL HIGH (ref 65–99)

## 2016-09-17 MED ORDER — ALPRAZOLAM 0.25 MG PO TABS: 0.2500 mg | ORAL_TABLET | Freq: Three times a day (TID) | ORAL | Status: DC | PRN

## 2016-09-17 MED ORDER — CEFTRIAXONE SODIUM-DEXTROSE 1-3.74 GM-% IV SOLR
1.0000 g | INTRAVENOUS | Status: DC
  Administered 2016-09-17: 1 g via INTRAVENOUS
  Filled 2016-09-17 (×2): qty 50

## 2016-09-17 MED ORDER — LEFLUNOMIDE 20 MG PO TABS
20.0000 mg | ORAL_TABLET | Freq: Every day | ORAL | Status: DC
  Administered 2016-09-17 – 2016-09-18 (×2): 20 mg via ORAL
  Filled 2016-09-17 (×3): qty 1

## 2016-09-17 MED ORDER — PREMIER PROTEIN SHAKE
11.0000 [oz_av] | Freq: Two times a day (BID) | ORAL | Status: DC
  Administered 2016-09-17 – 2016-09-18 (×2): 11 [oz_av] via ORAL

## 2016-09-17 NOTE — Progress Notes (Signed)
Patient ID: Anita Atkins, female   DOB: 25-Oct-1934, 82 y.o.   MRN: [id number]  [facility] PROGRESS NOTE  Anita Atkins [id number] DOB: 1935-01-27 DOA: 09/15/2016 PCP: Enid Derry, MD  HPI/Subjective: Patient very short of breath with walking around. Okay with sitting. Breathing a little bit better than when she came in.  Objective: Vitals:   09/17/16 0754 09/17/16 1130  BP: (!) 97/59 102/62  Pulse: 96 99  Resp:  18  Temp: 98.4 F (36.9 C) 98.6 F (37 C)    Filed Weights   09/15/16 2218 09/16/16 0316  Weight: 38.6 kg (85 lb) 38.4 kg (84 lb 11.2 oz)    ROS: Review of Systems  Constitutional: Negative for chills and fever.  Eyes: Negative for blurred vision.  Respiratory: Positive for cough, shortness of breath and wheezing.   Cardiovascular: Negative for chest pain.  Gastrointestinal: Negative for abdominal pain, constipation, diarrhea, nausea and vomiting.  Genitourinary: Negative for dysuria.  Musculoskeletal: Negative for joint pain.  Neurological: Negative for dizziness and headaches.   Exam: Physical Exam  Constitutional: She is oriented to person, place, and time.  HENT:  Nose: No mucosal edema.  Mouth/Throat: No oropharyngeal exudate or posterior oropharyngeal edema.  Eyes: Conjunctivae, EOM and lids are normal. Pupils are equal, round, and reactive to light.  Neck: No JVD present. Carotid bruit is not present. No edema present. No thyroid mass and no thyromegaly present.  Cardiovascular: S1 normal and S2 normal.  Tachycardia present.  Exam reveals no gallop.   No murmur heard. Pulses:      Dorsalis pedis pulses are 2+ on the right side, and 2+ on the left side.  Respiratory: No respiratory distress. She has decreased breath sounds in the right lower field and the left lower field. She has no wheezes. She has rhonchi in the right lower field and the left lower field. She has no rales.  GI: Soft. Bowel sounds are normal. There is no tenderness.   Musculoskeletal:       Right ankle: She exhibits no swelling.       Left ankle: She exhibits no swelling.  Lymphadenopathy:    She has no cervical adenopathy.  Neurological: She is alert and oriented to person, place, and time. No cranial nerve deficit.  Skin: Skin is warm. No rash noted. Nails show clubbing.  Psychiatric: She has a normal mood and affect.      Data Reviewed: Basic Metabolic Panel:  Recent Labs Lab 09/15/16 2249 09/16/16 0402  NA 140 140  K 3.9 3.4*  CL 105 108  CO2 26 24  GLUCOSE 181* 230*  BUN 21* 18  CREATININE 0.44 0.58  CALCIUM 9.1 8.3*   CBC:  Recent Labs Lab 09/15/16 2249 09/16/16 0402  WBC 7.4 8.6  HGB 13.2 12.8  HCT 36.9 37.0  MCV 103.2* 106.1*  PLT 241 200   Cardiac Enzymes:  Recent Labs Lab 09/15/16 2249  TROPONINI <0.03    CBG:  Recent Labs Lab 09/16/16 1123 09/16/16 1616 09/16/16 2145 09/17/16 0756 09/17/16 1116  GLUCAP 320* 98 200* 152* 248*       Studies: Ct Angio Chest Pe W Or Wo Contrast  Result Date: 09/16/2016 CLINICAL DATA:  Shortness of breath, cough, and bilateral rib pain. History of pulmonary fibrosis. EXAM: CT ANGIOGRAPHY CHEST WITH CONTRAST TECHNIQUE: Multidetector CT imaging of the chest was performed using the standard protocol during bolus administration of intravenous contrast. Multiplanar CT image reconstructions and MIPs were obtained to evaluate the vascular anatomy.  CONTRAST:  75 mL Isovue 370 COMPARISON:  High-resolution chest CT 05/04/2016 FINDINGS: Cardiovascular: Pulmonary arterial opacification is adequate without evidence of emboli. There is thoracic aortic atherosclerosis without evidence of aneurysm or dissection. Prior CABG is noted with LAD and right coronary artery atherosclerosis. The heart is normal in size. No pericardial effusion. Mediastinum/Nodes: No enlarged axillary, mediastinal, or hilar lymph nodes are identified. The thyroid and esophagus are grossly unremarkable. Minimal  secretions are noted in the trachea. Lungs/Pleura: No pleural effusion or pneumothorax. Extensive subpleural reticulation is again seen throughout both lungs with traction bronchiectasis and bibasilar honeycombing, not significantly changed. No acute airspace consolidation or mass is identified. Upper Abdomen: Prior cholecystectomy. Prior gastric bypass. Unchanged low density left adrenal gland thickening. Musculoskeletal: No chest wall mass. There is a transverse nondisplaced fracture through the inferior aspect of the right scapula with callus formation, new from the prior CT. Review of the MIP images confirms the above findings. IMPRESSION: 1. No evidence of pulmonary emboli. 2. Unchanged advanced fibrotic interstitial lung disease compatible with usual interstitial pneumonia. 3. Healing nondisplaced inferior right scapular fracture. 4. Aortic atherosclerosis. Electronically Signed   By: Logan Bores M.D.   On: 09/16/2016 13:45   Dg Chest Port 1 View  Result Date: 09/15/2016 CLINICAL DATA:  82 y/o  F; shortness of breath. EXAM: PORTABLE CHEST 1 VIEW COMPARISON:  08/07/2016 chest radiograph FINDINGS: Coarse reticular markings of the lungs is compatible with pulmonary fibrosis. No focal consolidation. No pleural effusion. Stable normal cardiac silhouette. Sternotomy wires are aligned. Right upper quadrant surgical clips, presumably cholecystectomy. No acute osseous abnormality is identified. IMPRESSION: Pulmonary fibrosis.  No focal consolidation. Electronically Signed   By: Kristine Garbe M.D.   On: 09/15/2016 23:35    Scheduled Meds: . aspirin EC  81 mg Oral Daily  . azelastine  1 spray Each Nare BID  . cefTRIAXone  1 g Intravenous Q24H  . diltiazem  120 mg Oral QHS  . enoxaparin (LOVENOX) injection  30 mg Subcutaneous Daily  . insulin aspart  0-20 Units Subcutaneous TID WC  . insulin aspart  0-5 Units Subcutaneous QHS  . insulin aspart  3 Units Subcutaneous TID WC  .  ipratropium-albuterol  3 mL Nebulization Q6H  . leflunomide  20 mg Oral Daily  . megestrol  40 mg Oral Daily  . methylPREDNISolone (SOLU-MEDROL) injection  60 mg Intravenous Daily  . mirtazapine  15 mg Oral QHS  . Nintedanib  150 mg Oral BID  . pantoprazole  40 mg Oral QAC breakfast  . protein supplement shake  11 oz Oral BID BM    Assessment/Plan:  1. Pulmonary fibrosis exacerbation. Continue nebulizer treatments. Decrease Solu-Medrol down to 60 mg IV daily. Empiric Rocephin. Patient is a full code. Check pulse oximetry with ambulation. 2. History of rheumatoid arthritis on immunosuppressants 3. History of bariatric surgery 4. Diabetes with hyperglycemia with steroids. Dropped down status of steroids and have on sliding scale 5. Malnutrition on Megace 6. Hypokalemia replace potassium orally 7. Tachycardia. Start Cardizem CD at night. We will be limited with hypotension on the dose of the Cardizem CD.  Code Status:     Code Status Orders        Start     Ordered   09/16/16 0310  Full code  Continuous     09/16/16 0309    Code Status History    Date Active Date Inactive Code Status Order ID Comments User Context   03/06/2016  9:51 AM 03/08/2016  1:12  PM Full Code [id number]  Vaughan Basta, MD Inpatient      Disposition Plan: Potentially home in the next day or so  Consultants:  Pulmonary  Antibiotics:  Rocephin  Time spent: 25 minutes  [place], [facility]           Patient ID: Anita Atkins, female   DOB: 1935/06/05, 82 y.o.   MRN: [id number]

## 2016-09-17 NOTE — Progress Notes (Deleted)
* [facility]     Assessment and Plan:  Pulmonary fibrosis.  Cough.   Date: 09/17/2016  MRN# [id number] Anita Atkins [birth year]   Anita Atkins is a 82 y.o. old female seen in follow up for chief complaint of  No chief complaint on file.    HPI:   82 yo  female with Pulmonary Fibrosis(no biopsy) for many years with chronic SOB and DOE with chronic cough. At her last visit with Dr. Mortimer Fries, she was maintained on Ofev, Symbicort, albuterol, PPI. She was also maintained on Tessalon as needed for cough, as well as Combivent nebs every 4 when necessary.    Medication:   Facility-Administered Encounter Medications as of 09/20/2016  Medication  . acetaminophen (TYLENOL) tablet 650 mg   Or  . acetaminophen (TYLENOL) suppository 650 mg  . ALPRAZolam (XANAX) tablet 0.25 mg  . aspirin EC tablet 81 mg  . azelastine (ASTELIN) 0.1 % nasal spray 1 spray  . benzonatate (TESSALON) capsule 100 mg  . cefTRIAXone (ROCEPHIN) IVPB 1 g  . diltiazem (CARDIZEM CD) 24 hr capsule 120 mg  . enoxaparin (LOVENOX) injection 30 mg  . feeding supplement (BOOST / RESOURCE BREEZE) liquid 1 Container  . insulin aspart (novoLOG) injection 0-20 Units  . insulin aspart (novoLOG) injection 0-5 Units  . insulin aspart (novoLOG) injection 3 Units  . ipratropium-albuterol (DUONEB) 0.5-2.5 (3) MG/3ML nebulizer solution 3 mL  . megestrol (MEGACE) tablet 40 mg  . methylPREDNISolone sodium succinate (SOLU-MEDROL) 125 mg/2 mL injection 60 mg  . mirtazapine (REMERON) tablet 15 mg  . Nintedanib (OFEV) CAPS 150 mg  . ondansetron (ZOFRAN) tablet 4 mg   Or  . ondansetron (ZOFRAN) injection 4 mg  . oxyCODONE-acetaminophen (PERCOCET/ROXICET) 5-325 MG per tablet 1 tablet  . pantoprazole (PROTONIX) EC tablet 40 mg  . senna-docusate (Senokot-S) tablet 1 tablet   Outpatient Encounter Prescriptions as of 09/20/2016  Medication Sig  . acetaminophen (TYLENOL) 500 MG tablet Take 500 mg by mouth every 6 (six)  hours as needed.   Marland Kitchen alendronate (FOSAMAX) 70 MG tablet Take 1 tablet by mouth once a week.  Marland Kitchen aspirin EC 81 MG tablet Take 81 mg by mouth daily.  Marland Kitchen azaTHIOprine (IMURAN) 50 MG tablet Take 75 mg by mouth daily. Patient takes 1 & 1/2 tabs of 50 mg  . azelastine (ASTELIN) 0.1 % nasal spray Place 1 spray into both nostrils 2 (two) times daily as needed.   . benzonatate (TESSALON) 100 MG capsule Take 1 capsule (100 mg total) by mouth 3 (three) times daily as needed for cough.  . feeding supplement (BOOST / RESOURCE BREEZE) LIQD Take 1 Container by mouth 3 (three) times daily between meals.  Marland Kitchen glucose blood (ACCU-CHEK AVIVA) test strip Use as instructed to check sugars once a day; E11.9, LON 99 months  . ipratropium-albuterol (DUONEB) 0.5-2.5 (3) MG/3ML SOLN Take 3 mLs by nebulization every 4 (four) hours as needed.  . leflunomide (ARAVA) 20 MG tablet Take 20 mg by mouth daily.  . megestrol (MEGACE) 40 MG tablet Take 40 mg by mouth daily.  . mirtazapine (REMERON) 15 MG tablet Take 1 tablet (15 mg total) by mouth at bedtime.  . OFEV 150 MG CAPS Take 150 mg by mouth 2 (two) times daily.   Marland Kitchen oxyCODONE-acetaminophen (ROXICET) 5-325 MG tablet Take 1 tablet by mouth every 4 (four) hours as needed.  . pantoprazole (PROTONIX) 20 MG tablet Take 1 tablet (20 mg total) by mouth daily.  . predniSONE (DELTASONE) 5  MG tablet Take 5 mg by mouth daily with breakfast. Take 1 to 1.5 by mouth daily for joint pain  . simvastatin (ZOCOR) 40 MG tablet Take 1 tablet (40 mg total) by mouth at bedtime.  . SYMBICORT 160-4.5 MCG/ACT inhaler Inhale 2 puffs into the lungs 2 (two) times daily.  . VENTOLIN HFA 108 (90 Base) MCG/ACT inhaler Inhale 2 puffs into the lungs every 6 (six) hours as needed.     Allergies:  Plaquenil [hydroxychloroquine sulfate]  Review of Systems: Gen:  Denies  fever, sweats. HEENT: Denies blurred vision. Cvc:  No dizziness, chest pain or heaviness Resp:   Denies cough or sputum porduction. Gi:  Denies swallowing difficulty, stomach pain. constipation, bowel incontinence Gu:  Denies bladder incontinence, burning urine Ext:   No Joint pain, stiffness. Skin: No skin rash, easy bruising. Endoc:  No polyuria, polydipsia. Psych: No depression, insomnia. Other:  All other systems were reviewed and found to be negative other than what is mentioned in the HPI.   Physical Examination:   VS: There were no vitals taken for this visit.  General Appearance: No distress  Neuro:without focal findings,  speech normal,  HEENT: PERRLA, EOM intact. Pulmonary: normal breath sounds, No wheezing.   CardiovascularNormal S1,S2.  No m/r/g.   Abdomen: Benign, Soft, non-tender. Renal:  No costovertebral tenderness  GU:  Not performed at this time. Endoc: No evident thyromegaly, no signs of acromegaly. Skin:   warm, no rash. Extremities: normal, no cyanosis, clubbing.   LABORATORY PANEL:   CBC  Recent Labs Lab 09/16/16 0402  WBC 8.6  HGB 12.8  HCT 37.0  PLT 200   ------------------------------------------------------------------------------------------------------------------  Chemistries   Recent Labs Lab 09/16/16 0402  NA 140  K 3.4*  CL 108  CO2 24  GLUCOSE 230*  BUN 18  CREATININE 0.58  CALCIUM 8.3*   ------------------------------------------------------------------------------------------------------------------  Cardiac Enzymes  Recent Labs Lab 09/15/16 2249  TROPONINI <0.03   ------------------------------------------------------------  RADIOLOGY:   No results found for this or any previous visit. Results for orders placed during the hospital encounter of 08/07/16  DG Chest 2 View   Narrative CLINICAL DATA:  Chronic fatigue and shortness of breath. History of pulmonary fibrosis. Currently in no acute distress.  EXAM: CHEST  2 VIEW  COMPARISON:  CT scan chest of May 04, 2016 and PA and lateral chest x-ray of April 03, 2016.  FINDINGS: The lungs  are well-expanded. The interstitial markings are diffusely increased and stable. No air bronchograms are observed. There is no pleural effusion or pneumothorax. There is apical pleural thickening bilaterally which is stable. The heart and pulmonary vascularity are normal. There is calcification in the wall of the thoracic aorta. The sternal wires are intact. The observed bony thorax is unremarkable.  IMPRESSION: Extensive changes of known pulmonary fibrosis. No acute cardiopulmonary abnormality.  Thoracic aortic atherosclerosis.   Electronically Signed   By: David  Martinique M.D.   On: 08/07/2016 13:33    ------------------------------------------------------------------------------------------------------------------  Thank  you for allowing [facility] to assist in the care of your patient. Our recommendations are noted above.  Please contact us if we can be of further service.   Marda Stalker, MD.  [facility] Office Number: [phone]  Patricia Pesa, M.D.  Vilinda Boehringer, M.D.  Merton Border, M.D  09/17/2016

## 2016-09-17 NOTE — Care Management (Signed)
   Patient suffers from generalized weakness due to sob which impairs their ability to perform daily activities like toileting, feeding, dressing, grooming, bathing in the home. A cane, walker, crutch will not resolve issue with performing activities of daily living. A wheelchair will allow patient to safely perform daily activities.  Patient can safely propel the wheelchair in the home or has a caregiver who can provide assistance.

## 2016-09-17 NOTE — Discharge Planning (Signed)
Patient SPO2 was 98% on RA at rest. Patient SPO2 was 94% or above on RA with ambulation/activity. If discharged within 24 hrs, patient will not need oxygen at discharge.

## 2016-09-17 NOTE — Progress Notes (Signed)
Shift assessment completed at 0800. Pt denied pain at this time unless stretching, then she has some pain to her rib area. Pt stated she felt lazy, did not want to [facility] and didn't care. Pt was assissted to bedside commode to void and had soft bm, returned to bed, urine sent to lab. Pt is on room air, lungs are decreased to bases with respirations shallow at rest. Hr regular, abdomen is soft, bs heard.ppp, no edema noted, sacral foam dressing intact. Pt complained of a dry mouth. Pt ate cereal for breakfast with fruit, tolerated this well. Report given to [facility] at 1115.

## 2016-09-17 NOTE — Progress Notes (Signed)
Initial Nutrition Assessment  DOCUMENTATION CODES:   Severe malnutrition in context of chronic illness, Underweight  INTERVENTION:  -Recommend liberalizing diet to Regular -Pt prefers Premier Protein shake; recommend sending BID. Boost Breeze discontinued -Recommend smaller, more frequent meals; pt declines scheduled snacks at this time -Recommend addition of MVI -Encourage additives to food such as gravies, butter, sauces, etc to increase calories without increasing volume   NUTRITION DIAGNOSIS:   Malnutrition related to chronic illness as evidenced by severe depletion of muscle mass, severe depletion of body fat, energy intake < or equal to 75% for > or equal to 1 month.  GOAL:   Patient will meet greater than or equal to 90% of their needs  MONITOR:   PO intake, Supplement acceptance, Labs, Weight trends  REASON FOR ASSESSMENT:   Consult, Malnutrition Screening Tool Assessment of nutrition requirement/status  ASSESSMENT:    82 yo female admitted with pulmonary fibrosis exacerbation. Pt with hx of gastric bypass, RA, DM  Pt reports poor appetite and wt loss. Pt reports she is not able to eat an entire meal, only eat small portions, early satiety. Pt reports typical day might be oatmeal or egg mcmuffin for breakfast and 1 premier protein shake. Pt reports she uses Premier protein shake and special K protein drinks but if she drinks one of these she is not able to eat a meal. Pt eating lunch on visit today but pt reporting her stomach is starting to hurt. Pt had roux-n-y gastric bypass in 2007, reports she takes MVI. Pt reports chronic wt loss; noted 7.7% wt loss in past 6 months per weight encounters. Pt is currently underweight.   Nutrition-Focused physical exam completed. Findings are severe fat depletion, severe muscle depletion, and no edema.   Labs: FSBS 98-323, potassium 3.4 Meds: remeron, megace, ss novolog, novolog  Diet Order:  Heart Healthy   Skin:  Wound (see  comment) (stage I coccyx)  Last BM:  09/14/16  Height:   Ht Readings from Last 1 Encounters:  09/16/16 5' (1.524 m)    Weight:   Wt Readings from Last 1 Encounters:  09/16/16 84 lb 11.2 oz (38.4 kg)    Filed Weights   09/15/16 2218 09/16/16 0316  Weight: 85 lb (38.6 kg) 84 lb 11.2 oz (38.4 kg)    BMI:  Body mass index is 16.54 kg/m.  Estimated Nutritional Needs:   Kcal:  1350-1550 kcals  Protein:  65-78 g  Fluid:  >/= 1.3 L  EDUCATION NEEDS:   Education needs addressed  Kerman Passey MS, [place], LDN [phone] Pager  [phone] Weekend/On-Call Pager

## 2016-09-17 NOTE — Care Management Note (Signed)
Case Management Note  Patient Details  Name: Anita Atkins MRN: [id number] Date of Birth: 1934/11/04  Subjective/Objective:                  Met with patient to discuss discharge planning. Patient lives with her daughter Anita Atkins and plans to return home at discharge. She has a rollator, tub seat, and bedside commode. She states her PCP is DR. Marzetta Board. She requests price for wheelchair which will be 32.74/month [facility]- she is not sure if she wants one. She uses [facility] for medications. She denies difficulty obtaining medications or getting to appointments. She states she is able to drive but "her daughter wont let her right now". She has transportation through daughter Anita Atkins.  Action/Plan:  List of home health agencies provided to patient- she is undecided about HHPT and Wheelchair. RNCM will continue to follow.   Expected Discharge Date:                  Expected Discharge Plan:     In-House Referral:     Discharge planning Services  CM Consult  Post Acute Care Choice:  Home Health, Durable Medical Equipment Choice offered to:  Patient  DME Arranged:  Wheelchair manual DME Agency:  [place]:  PT [place]:     Status of Service:  In process, will continue to follow  If discussed at Long Length of Stay Meetings, dates discussed:    Additional Comments:  Marshell Garfinkel, RN 09/17/2016, 1:04 PM

## 2016-09-17 NOTE — Progress Notes (Signed)
.  [facility] Consultation     ASSESSMENT/PLAN   Patient with end-stage pulmonary fibrosis and severe cachexia. Agree with Solu-Medrol, she is presently on OFEV. Would empirically place patient on broad-spectrum antibiotics as she has been on immunosuppressive therapy such as Imuran and immune modulator such as OFEV. Would hold Imuran while she is in the hospital. Has severe pulmonary cachexia, continue IV steroids, aggressive BD therapy  Hyperglycemia. Most likely steroid-induced area be managed by hospitalist Will continue to follow along Assess for hypoxia and oxygen needs   Name: Pearlina Werther MRN: [id number] DOB: 1935/05/31    ADMISSION DATE:  09/15/2016 CONSULTATION DATE:  09/16/16  REFERRING MD :  Dr. Estanislado Pandy  CHIEF COMPLAINT:  Shortness of breath   HISTORY OF PRESENT ILLNESS:  Mrs. Combee is a very pleasant 82 year old Caucasian female with a past medical history remarkable for rheumatoid arthritis, coronary artery disease, status post bypass surgery, monoclonal paraproteinemia, hyperlipidemia, diabetes with proximally 4 year history of pulmonary fibrosis. Her present medical regimen includes Nintedanib(OFEV). She presented with increasing shortness of breath, cough, generally dry nonproductive sputum although she did have some sputum productivity last week. She is not on home oxygen therapy she has not qualified in the past. She does complain of significant weight loss and failure to thrive  In the past, she has had normal 6MWT and normal nocturnal o2 sats  REVIEW OF SYSTEMS:   Constitutional: Feels generally poor chronically ill Cardiovascular: She does complain of right-sided costal pleuritic type chest pain especially worse with deep inspiration and motion Pulmonary: See history of present illness   The remainder of systems were reviewed and were found to be negative other than what is documented in the HPI.    VITAL SIGNS: Temp:  [96.3 F (35.7  C)-98.6 F (37 C)] 98.6 F (37 C) (01/22 1130) Pulse Rate:  [94-122] 99 (01/22 1130) Resp:  [16-18] 18 (01/22 1130) BP: (97-135)/(53-82) 102/62 (01/22 1130) SpO2:  [93 %-100 %] 94 % (01/22 1210)   INTAKE / OUTPUT:  Intake/Output Summary (Last 24 hours) at 09/17/16 1358 Last data filed at 09/16/16 1849  Gross per 24 hour  Intake              240 ml  Output                0 ml  Net              240 ml    Physical Examination:   VS: BP 102/62 (BP Location: Left Arm)   Pulse 99   Temp 98.6 F (37 C) (Oral)   Resp 18   Ht 5' (1.524 m)   Wt 84 lb 11.2 oz (38.4 kg)   SpO2 94% Comment: with ambulation/activity   BMI 16.54 kg/m   General Appearance: No distress, Patient is severely wasted cachectic and appears chronically ill Neuro:without focal findings, mental status, speech normal,. HEENT: PERRLA, EOM intact, no ptosis, no other lesions noticed;  Pulmonary: Bibasilar inspiratory crackles appreciated diffusely CardiovascularNormal S1,S2. S4 appreciated, systolic murmur left parasternal border  Abdomen: Benign, Soft, non-tender, No masses, hepatosplenomegaly, No lymphadenopathy Renal:  No costovertebral tenderness  GU:  Not performed at this time. Endoc: No evident thyromegaly, no signs of acromegaly. Skin:   warm, no rashes, no ecchymosis  Extremities: normal, no cyanosis, mild clubbing noted   LABORATORY PANEL:   CBC  Recent Labs Lab 09/16/16 0402  WBC 8.6  HGB 12.8  HCT 37.0  PLT 200  Chemistries   Recent Labs Lab 09/16/16 0402  NA 140  K 3.4*  CL 108  CO2 24  GLUCOSE 230*  BUN 18  CREATININE 0.58  CALCIUM 8.3*     Recent Labs Lab 09/16/16 0833 09/16/16 1123 09/16/16 1616 09/16/16 2145 09/17/16 0756 09/17/16 1116  GLUCAP 323* 320* 98 200* 152* 248*   No results for input(s): PHART, PCO2ART, PO2ART in the last 168 hours. No results for input(s): AST, ALT, ALKPHOS, BILITOT, ALBUMIN in the last 168 hours.  Cardiac Enzymes  Recent  Labs Lab 09/15/16 2249  TROPONINI <0.03    RADIOLOGY:  Ct Angio Chest Pe W Or Wo Contrast  Result Date: 09/16/2016 CLINICAL DATA:  Shortness of breath, cough, and bilateral rib pain. History of pulmonary fibrosis. EXAM: CT ANGIOGRAPHY CHEST WITH CONTRAST TECHNIQUE: Multidetector CT imaging of the chest was performed using the standard protocol during bolus administration of intravenous contrast. Multiplanar CT image reconstructions and MIPs were obtained to evaluate the vascular anatomy. CONTRAST:  75 mL Isovue 370 COMPARISON:  High-resolution chest CT 05/04/2016 FINDINGS: Cardiovascular: Pulmonary arterial opacification is adequate without evidence of emboli. There is thoracic aortic atherosclerosis without evidence of aneurysm or dissection. Prior CABG is noted with LAD and right coronary artery atherosclerosis. The heart is normal in size. No pericardial effusion. Mediastinum/Nodes: No enlarged axillary, mediastinal, or hilar lymph nodes are identified. The thyroid and esophagus are grossly unremarkable. Minimal secretions are noted in the trachea. Lungs/Pleura: No pleural effusion or pneumothorax. Extensive subpleural reticulation is again seen throughout both lungs with traction bronchiectasis and bibasilar honeycombing, not significantly changed. No acute airspace consolidation or mass is identified. Upper Abdomen: Prior cholecystectomy. Prior gastric bypass. Unchanged low density left adrenal gland thickening. Musculoskeletal: No chest wall mass. There is a transverse nondisplaced fracture through the inferior aspect of the right scapula with callus formation, new from the prior CT. Review of the MIP images confirms the above findings. IMPRESSION: 1. No evidence of pulmonary emboli. 2. Unchanged advanced fibrotic interstitial lung disease compatible with usual interstitial pneumonia. 3. Healing nondisplaced inferior right scapular fracture. 4. Aortic atherosclerosis. Electronically Signed   By: Logan Bores M.D.   On: 09/16/2016 13:45   Dg Chest Port 1 View  Result Date: 09/15/2016 CLINICAL DATA:  82 y/o  F; shortness of breath. EXAM: PORTABLE CHEST 1 VIEW COMPARISON:  08/07/2016 chest radiograph FINDINGS: Coarse reticular markings of the lungs is compatible with pulmonary fibrosis. No focal consolidation. No pleural effusion. Stable normal cardiac silhouette. Sternotomy wires are aligned. Right upper quadrant surgical clips, presumably cholecystectomy. No acute osseous abnormality is identified. IMPRESSION: Pulmonary fibrosis.  No focal consolidation. Electronically Signed   By: Kristine Garbe M.D.   On: 09/15/2016 23:35   I have personally obtained a history, examined the patient, evaluated Pertinent laboratory and RadioGraphic/imaging results, and  formulated the assessment and plan     Patient satisfied with Plan of action and management. All questions answered  Corrin Parker, M.D.  [facility]  Medical Director [place] Director [facility] Cardio-Pulmonary Department

## 2016-09-18 DIAGNOSIS — E1140 Type 2 diabetes mellitus with diabetic neuropathy, unspecified: Secondary | ICD-10-CM | POA: Diagnosis not present

## 2016-09-18 DIAGNOSIS — J8410 Pulmonary fibrosis, unspecified: Secondary | ICD-10-CM | POA: Diagnosis not present

## 2016-09-18 DIAGNOSIS — E46 Unspecified protein-calorie malnutrition: Secondary | ICD-10-CM | POA: Diagnosis not present

## 2016-09-18 DIAGNOSIS — I959 Hypotension, unspecified: Secondary | ICD-10-CM | POA: Diagnosis not present

## 2016-09-18 DIAGNOSIS — R64 Cachexia: Secondary | ICD-10-CM | POA: Diagnosis not present

## 2016-09-18 DIAGNOSIS — J9611 Chronic respiratory failure with hypoxia: Secondary | ICD-10-CM | POA: Diagnosis not present

## 2016-09-18 DIAGNOSIS — D472 Monoclonal gammopathy: Secondary | ICD-10-CM | POA: Diagnosis not present

## 2016-09-18 DIAGNOSIS — E1165 Type 2 diabetes mellitus with hyperglycemia: Secondary | ICD-10-CM | POA: Diagnosis not present

## 2016-09-18 DIAGNOSIS — Z681 Body mass index (BMI) 19 or less, adult: Secondary | ICD-10-CM | POA: Diagnosis not present

## 2016-09-18 LAB — GLUCOSE, CAPILLARY
GLUCOSE-CAPILLARY: 154 mg/dL — AB (ref 65–99)
GLUCOSE-CAPILLARY: 190 mg/dL — AB (ref 65–99)

## 2016-09-18 MED ORDER — PREDNISONE 10 MG PO TABS: ORAL_TABLET | ORAL | 0 refills | Status: DC

## 2016-09-18 MED ORDER — AMOXICILLIN-POT CLAVULANATE 875-125 MG PO TABS: 1.0000 | ORAL_TABLET | Freq: Two times a day (BID) | ORAL | 0 refills | Status: DC

## 2016-09-18 MED ORDER — AMOXICILLIN-POT CLAVULANATE 875-125 MG PO TABS: 1.0000 | ORAL_TABLET | Freq: Two times a day (BID) | ORAL | Status: DC

## 2016-09-18 MED ORDER — DILTIAZEM HCL ER COATED BEADS 120 MG PO CP24: 120.0000 mg | ORAL_CAPSULE | Freq: Every day | ORAL | 0 refills | Status: DC

## 2016-09-18 MED ORDER — PREDNISONE 20 MG PO TABS
40.0000 mg | ORAL_TABLET | Freq: Every day | ORAL | Status: DC
  Administered 2016-09-18: 40 mg via ORAL
  Filled 2016-09-18: qty 2

## 2016-09-18 NOTE — Progress Notes (Addendum)
SATURATION QUALIFICATIONS:   Patient Saturations on Room Air at Rest = 97  Patient Saturations on Room Air while Ambulating = 84  Patient Saturations on 2 liters oxygen 99% while ambulating  Patient has Pulmonary Fibrosis

## 2016-09-18 NOTE — Progress Notes (Signed)
Patient is being discharged with her family. Patient have no complaints.

## 2016-09-18 NOTE — Care Management (Signed)
Spoke with patient regarding discharge to home today. She has picked [facility] for home health. RN to assess for home O2 need due to pulmonary fibrosis. Referral to [facility] with [facility] for home health and home O2. No further RNCM needs. Patient has declined wheelchair.

## 2016-09-18 NOTE — Progress Notes (Signed)
Physical Therapy Treatment Patient Details Name: Anita Atkins MRN: [id number] DOB: 23-Aug-1935 Today's Date: 09/18/2016    History of Present Illness Patient is a 82 y.o. female admitted on 19 JAN for increasing SOB x1 week. PMH includes DMII, hyperlipidemia, monoclonal paraproteinemia, pulmonary fibrosis, and RA.     PT Comments    PT in bed, needing to use bathroom.  Bed mobility without assist.  She was able to ambulate to/from bathroom with walker and min guard.  No lob's noted.  While on toilet but with increased anxiety stating "I can't breath"  She walked back to chair per her request and reclined.  O2 sats obtained 84% on room air HR 113.  After short rest she returned to 97% HR 113.  Discussed with primary nurse.  MD called and made aware.  Will order home O2. Pt stated she was relieved with O2 for home stating SOB makes her limit her mobility and delay getting out of bed to use bathroom or other activity which she also attributes to decreased strength.      Follow Up Recommendations  Home health PT     Equipment Recommendations  None recommended by PT    Recommendations for Other Services       Precautions / Restrictions Precautions Precautions: Fall Restrictions Weight Bearing Restrictions: No    Mobility  Bed Mobility Overal bed mobility: Independent                Transfers Overall transfer level: Modified independent Equipment used: Rolling walker (2 wheeled)             General transfer comment: Patient moves from sit to stand and stand to sit with supervision, demonstrating good safety awareness.  Ambulation/Gait Ambulation/Gait assistance: Min guard Ambulation Distance (Feet): 15 Feet Assistive device: Rolling walker (2 wheeled)       General Gait Details: increased anxiety with gait due to SOB "I can't breathe"   Stairs            Wheelchair Mobility    Modified Rankin (Stroke Patients Only)       Balance Overall balance  assessment: Needs assistance;History of Falls Sitting-balance support: Feet supported Sitting balance-Leahy Scale: Good     Standing balance support: Bilateral upper extremity supported Standing balance-Leahy Scale: Good                      Cognition Arousal/Alertness: Awake/alert Behavior During Therapy: WFL for tasks assessed/performed Overall Cognitive Status: Within Functional Limits for tasks assessed                      Exercises      General Comments        Pertinent Vitals/Pain Pain Assessment: 0-10 Pain Score: 2  Pain Descriptors / Indicators: Sore Pain Intervention(s): Limited activity within patient's tolerance    Home Living                      Prior Function            PT Goals (current goals can now be found in the care plan section) Progress towards PT goals: Progressing toward goals    Frequency    Min 2X/week      PT Plan Current plan remains appropriate    Co-evaluation             End of Session Equipment Utilized During Treatment: Gait belt Activity Tolerance: Other (comment);Patient limited by  fatigue Patient left: in chair;with call bell/phone within reach;with chair alarm set;Other (comment)     Time: [id number] PT Time Calculation (min) (ACUTE ONLY): 20 min  Charges:  $Gait Training: 8-22 mins                    G Codes:      Chesley Noon 2016/10/14, 9:22 AM

## 2016-09-19 NOTE — Discharge Summary (Signed)
[facility] [place] NAME: Anita Atkins    MR#:  [id number]  DATE OF BIRTH:  Sep 15, 1934  DATE OF ADMISSION:  09/15/2016 ADMITTING PHYSICIAN: Saundra Shelling, MD  DATE OF DISCHARGE: 09/18/2016 12:53 PM  PRIMARY CARE PHYSICIAN: Enid Derry, MD    ADMISSION DIAGNOSIS:  Shortness of breath [R06.02] Pulmonary fibrosis (HCC) [J84.10] Chest wall pain [R07.89] Weakness [R53.1] SOB (shortness of breath) [R06.02] Hypoxia [R09.02] Adult failure to thrive syndrome [R62.7]  DISCHARGE DIAGNOSIS:  Principal Problem:   Dyspnea Active Problems:   Pressure injury of skin   SECONDARY DIAGNOSIS:   Past Medical History:  Diagnosis Date  . Diabetes mellitus without complication ([place])   . High cholesterol   . Monoclonal paraproteinemia 01/31/2016  . Pulmonary fibrosis ([place])   . RA (rheumatoid arthritis) ([place])   . Shingles   . Status post bariatric surgery 02/13/2016    HOSPITAL COURSE:   1.  Pulmonary fibrosis exacerbation. Patient was started on nebulizer treatments and steroids. I empirically gave Rocephin. The patient was a full code and I did a CPE discussions with her. She will talk with her family about CODE STATUS in February. Augmentin prescribed on discharge. 2. Chronic respiratory failure patient's pulse ox did drop down into the 80s with ambulation. She was set up with oxygen at home. 3. History of rheumatoid arthritis on immunosuppressants 4. History of bariatric surgery in the past 5. Diabetes mellitus type 2 with hyperglycemia with steroids. No need for insulin as outpatient. The patient controls it with diet. If the patient is to be on long-term steroids then that would be a different story. 6. Malnutrition on Megace 7. Hypokalemia replaced potassium orally during the hospital course 8. Tachycardia did start low dose Cardizem CD at night. We will be limited on the dosage secondary to relative hypotension.   DISCHARGE CONDITIONS:    Satisfactory  CONSULTS OBTAINED:   pulmonary  DRUG ALLERGIES:   Allergies  Allergen Reactions  . Plaquenil [Hydroxychloroquine Sulfate] Rash    DISCHARGE MEDICATIONS:   Discharge Medication List as of 09/18/2016 12:26 PM    START taking these medications   Details  amoxicillin-clavulanate (AUGMENTIN) 875-125 MG tablet Take 1 tablet by mouth every 12 (twelve) hours., Starting Tue 09/18/2016, Print    diltiazem (CARDIZEM CD) 120 MG 24 hr capsule Take 1 capsule (120 mg total) by mouth at bedtime., Starting Tue 09/18/2016, Print      CONTINUE these medications which have CHANGED   Details  predniSONE (DELTASONE) 10 MG tablet 4 tablets for two days; 3 tablets for two days; 2 tablets for two days; 1 tablet for two days; 1/2 tablet daily afterwards, Print      CONTINUE these medications which have NOT CHANGED   Details  acetaminophen (TYLENOL) 500 MG tablet Take 500 mg by mouth every 6 (six) hours as needed. , Historical Med    alendronate (FOSAMAX) 70 MG tablet Take 1 tablet by mouth once a week., Starting Fri 11/11/2015, Historical Med    aspirin EC 81 MG tablet Take 81 mg by mouth daily., Historical Med    azaTHIOprine (IMURAN) 50 MG tablet Take 75 mg by mouth daily. Patient takes 1 & 1/2 tabs of 50 mg, Starting Wed 03/21/2016, Historical Med    benzonatate (TESSALON) 100 MG capsule Take 1 capsule (100 mg total) by mouth 3 (three) times daily as needed for cough., Starting Tue 05/22/2016, Normal    feeding supplement (BOOST / RESOURCE BREEZE) LIQD Take 1  Container by mouth 3 (three) times daily between meals., Starting Thu 03/08/2016, Normal    glucose blood (ACCU-CHEK AVIVA) test strip Use as instructed to check sugars once a day; E11.9, LON 99 months, Normal    ipratropium-albuterol (DUONEB) 0.5-2.5 (3) MG/3ML SOLN Take 3 mLs by nebulization every 4 (four) hours as needed., Starting Tue 05/22/2016, Print    leflunomide (ARAVA) 20 MG tablet Take 20 mg by mouth daily.,  Historical Med    megestrol (MEGACE) 40 MG tablet Take 40 mg by mouth daily., Historical Med    mirtazapine (REMERON) 15 MG tablet Take 1 tablet (15 mg total) by mouth at bedtime., Starting Mon 02/13/2016, Normal    OFEV 150 MG CAPS Take 150 mg by mouth 2 (two) times daily. , Starting Mon 07/25/2015, Historical Med    oxyCODONE-acetaminophen (ROXICET) 5-325 MG tablet Take 1 tablet by mouth every 4 (four) hours as needed., Starting Tue 08/07/2016, Print    pantoprazole (PROTONIX) 20 MG tablet Take 1 tablet (20 mg total) by mouth daily., Starting Wed 12/07/2015, Until Thu 12/06/2016, Normal    SYMBICORT 160-4.5 MCG/ACT inhaler Inhale 2 puffs into the lungs 2 (two) times daily., Starting Thu 04/12/2016, Normal    VENTOLIN HFA 108 (90 Base) MCG/ACT inhaler Inhale 2 puffs into the lungs every 6 (six) hours as needed., Starting Thu 04/12/2016, Normal    azelastine (ASTELIN) 0.1 % nasal spray Place 1 spray into both nostrils 2 (two) times daily as needed. , Starting Wed 03/16/2015, Historical Med      STOP taking these medications     simvastatin (ZOCOR) 40 MG tablet          DISCHARGE INSTRUCTIONS:   Follow-up with Dr. Mortimer Fries pulmonary as scheduled Follow-up with PMD one week  If you experience worsening of your admission symptoms, develop shortness of breath, life threatening emergency, suicidal or homicidal thoughts you must seek medical attention immediately by calling 911 or calling your MD immediately  if symptoms less severe.  You Must read complete instructions/literature along with all the possible adverse reactions/side effects for all the Medicines you take and that have been prescribed to you. Take any new Medicines after you have completely understood and accept all the possible adverse reactions/side effects.   Please note  You were cared for by a hospitalist during your hospital stay. If you have any questions about your discharge medications or the care you received while you  were in the hospital after you are discharged, you can call the unit and asked to speak with the hospitalist on call if the hospitalist that took care of you is not available. Once you are discharged, your primary care physician will handle any further medical issues. Please note that NO REFILLS for any discharge medications will be authorized once you are discharged, as it is imperative that you return to your primary care physician (or establish a relationship with a primary care physician if you do not have one) for your aftercare needs so that they can reassess your need for medications and monitor your lab values.    Today   CHIEF COMPLAINT:   Chief Complaint  Patient presents with  . Shortness of Breath  . Chest Pain    HISTORY OF PRESENT ILLNESS:  Anita Atkins  is a 82 y.o. female with a known history of Pulmonary fibrosis presented with shortness of breath   VITAL SIGNS:  Blood pressure 126/60, pulse 92, temperature 98 F (36.7 C), temperature source Oral, resp. rate 18, height  5' (1.524 m), weight 38.4 kg (84 lb 11.2 oz), SpO2 97 %.  I/O:  No intake or output data in the 24 hours ending 09/19/16 1248  PHYSICAL EXAMINATION:  GENERAL:  82 y.o.-year-old patient lying in the bed with no acute distress.  EYES: Pupils equal, round, reactive to light and accommodation. No scleral icterus. Extraocular muscles intact.  HEENT: Head atraumatic, normocephalic. Oropharynx and nasopharynx clear.  NECK:  Supple, no jugular venous distention. No thyroid enlargement, no tenderness.  LUNGS: Decreased breath sounds bilaterally, no wheezing, rales,rhonchi or crepitation. No use of accessory muscles of respiration.  CARDIOVASCULAR: S1, S2 normal. No murmurs, rubs, or gallops.  ABDOMEN: Soft, non-tender, non-distended. Bowel sounds present. No organomegaly or mass.  EXTREMITIES: No pedal edema, cyanosis, or clubbing.  NEUROLOGIC: Cranial nerves II through XII are intact. Muscle strength 5/5 in  all extremities. Sensation intact. Gait not checked.  PSYCHIATRIC: The patient is alert and oriented x 3.  SKIN: No obvious rash, lesion, or ulcer.   DATA REVIEW:   CBC  Recent Labs Lab 09/16/16 0402  WBC 8.6  HGB 12.8  HCT 37.0  PLT 200    Chemistries   Recent Labs Lab 09/16/16 0402  NA 140  K 3.4*  CL 108  CO2 24  GLUCOSE 230*  BUN 18  CREATININE 0.58  CALCIUM 8.3*    Cardiac Enzymes  Recent Labs Lab 09/15/16 2249  TROPONINI <0.03    Microbiology Results  Results for orders placed or performed during the hospital encounter of 03/06/16  Blood Culture (routine x 2)     Status: None   Collection Time: 03/06/16  7:58 AM  Result Value Ref Range Status   Specimen Description BLOOD RIGHT ASSIST CONTROL  Final   Special Requests BOTTLES DRAWN AEROBIC AND ANAEROBIC  [place]  Final   Culture NO GROWTH 5 DAYS  Final   Report Status 03/11/2016 FINAL  Final  Blood Culture (routine x 2)     Status: None   Collection Time: 03/06/16  7:59 AM  Result Value Ref Range Status   Specimen Description BLOOD LEFT ASSIST CONTROL  Final   Special Requests BOTTLES DRAWN AEROBIC AND ANAEROBIC  [place]  Final   Culture NO GROWTH 5 DAYS  Final   Report Status 03/11/2016 FINAL  Final  Urine culture     Status: None   Collection Time: 03/06/16 12:06 PM  Result Value Ref Range Status   Specimen Description URINE, RANDOM  Final   Special Requests NONE  Final   Culture NO GROWTH Performed at [facility]   Final   Report Status 03/07/2016 FINAL  Final     Management plans discussed with the patient, And she is in agreement.  CODE STATUS:  Code Status History    Date Active Date Inactive Code Status Order ID Comments User Context   09/16/2016  3:09 AM 09/18/2016  3:58 PM Full Code [id number]  Saundra Shelling, MD Inpatient   03/06/2016  9:51 AM 03/08/2016  1:12 PM Full Code [id number]  Vaughan Basta, MD Inpatient      TOTAL TIME TAKING CARE OF THIS PATIENT: 30 50 minutes.     Loletha Grayer M.D on 09/19/2016 at 12:48 PM  Between 7am to 6pm - Pager - [phone]  After 6pm go to [email or web address] - password [email or web address]  [facility] Office  [phone]  CC: Primary care physician; Enid Derry, MD

## 2016-09-20 ENCOUNTER — Encounter: Payer: Self-pay | Admitting: Pulmonary Disease

## 2016-09-20 ENCOUNTER — Ambulatory Visit: Payer: Commercial Managed Care - HMO | Admitting: Internal Medicine

## 2016-09-20 ENCOUNTER — Telehealth: Payer: Self-pay

## 2016-09-20 ENCOUNTER — Ambulatory Visit (INDEPENDENT_AMBULATORY_CARE_PROVIDER_SITE_OTHER): Payer: Medicare HMO | Admitting: Pulmonary Disease

## 2016-09-20 VITALS — BP 104/68 | HR 123 | Wt 95.0 lb

## 2016-09-20 DIAGNOSIS — D472 Monoclonal gammopathy: Secondary | ICD-10-CM | POA: Diagnosis not present

## 2016-09-20 DIAGNOSIS — T380X5D Adverse effect of glucocorticoids and synthetic analogues, subsequent encounter: Secondary | ICD-10-CM | POA: Diagnosis not present

## 2016-09-20 DIAGNOSIS — M069 Rheumatoid arthritis, unspecified: Secondary | ICD-10-CM | POA: Diagnosis not present

## 2016-09-20 DIAGNOSIS — R634 Abnormal weight loss: Secondary | ICD-10-CM

## 2016-09-20 DIAGNOSIS — J8410 Pulmonary fibrosis, unspecified: Secondary | ICD-10-CM | POA: Diagnosis not present

## 2016-09-20 DIAGNOSIS — Z7951 Long term (current) use of inhaled steroids: Secondary | ICD-10-CM | POA: Diagnosis not present

## 2016-09-20 DIAGNOSIS — R197 Diarrhea, unspecified: Secondary | ICD-10-CM

## 2016-09-20 DIAGNOSIS — R0902 Hypoxemia: Secondary | ICD-10-CM

## 2016-09-20 DIAGNOSIS — R531 Weakness: Secondary | ICD-10-CM

## 2016-09-20 DIAGNOSIS — Z9884 Bariatric surgery status: Secondary | ICD-10-CM | POA: Diagnosis not present

## 2016-09-20 DIAGNOSIS — E46 Unspecified protein-calorie malnutrition: Secondary | ICD-10-CM | POA: Diagnosis not present

## 2016-09-20 DIAGNOSIS — E1165 Type 2 diabetes mellitus with hyperglycemia: Secondary | ICD-10-CM | POA: Diagnosis not present

## 2016-09-20 MED ORDER — GLIPIZIDE 5 MG PO TABS: 5.0000 mg | ORAL_TABLET | Freq: Two times a day (BID) | ORAL | 0 refills | Status: DC

## 2016-09-20 MED ORDER — OXYCODONE-ACETAMINOPHEN 5-325 MG PO TABS: 1.0000 | ORAL_TABLET | Freq: Four times a day (QID) | ORAL | 0 refills | Status: DC | PRN

## 2016-09-20 NOTE — Progress Notes (Signed)
PULMONARY POST HOSPITAL FOLLOW UP NOTE  PROBLEMS:  End stage pulmonary fibrosis Chronic hypoxemic respiratory failure Cachexia    INTERVAL HISTORY: Hospitalized 1/20-1/23/18 for profound weakness, diarrhea, dehydration, weight loss, slightly worsening SOB. Discharged home on tapering dose of prednisone and newly started on O2. On chronic immunosuppression (azathioprine) for RA.   SUBJ: Pt of DK last seen in office 08/13/16 and seen in consultation by DK during recent hospitalization. She is really little better than when she was admitted. Still C/O diarrhea, anorexia, weight loss, dyspnea on minimal exertion (but more limited by profound weakness). Continues to lose weight. Presently on prednisone 40 mg daily which is causing hyperglycemia. Denies CP, fever, purulent sputum, hemoptysis, LE edema and calf tenderness.  OBJ: Vitals:   09/20/16 0918  BP: 104/68  Pulse: (!) 123  SpO2: 98%  Weight: 95 lb (43.1 kg)   Cachectic, appears fatigued, not dyspneic @ rest HEENT WNL except temporal wasting No JVD noted Diminished BS, bilateral crackles, no wheezes Reg, no M Abd scaphoid, NT, +BS Extremities with profound muscle wasting, no edema   DATA: CT chest 09/16/16: severe pulmonary fibrosis  IMPRESSION: 1) Chronic hypoxemic respiratory failure due to severe, end stage, pulmonary fibrosis - suspect rheumatoid lung 2) Profound weakness, cachexia, anorexia - likely manifestations of end stage lung disease but she is also on numerous meds that might be contributing 3) Diarrhea - likely due to Ofev  PLAN: 1) Change prednisone to 20 mg daily and stay on this until follow up with Dr Mortimer Fries 2) Change Imuran (Azathioprine) to 50 mg daily 3) Hold Ofev for one week (through 09/26/16), then resume Ofev once a day until follow up with Dr Mortimer Fries 4) We discussed the end stage nature of her lung disease and acknowledged the inexorable decline that she iss now experiencing. I recommended that we enlist  Hospice in her care. She and her daughter note that they have HHN coming in this post hospital setting. The option of Hospice is to be further discussed in follow with Dr Mortimer Fries in 2-3 weeks 5) I have refilled Roxicet (Oxycodone - acetaminophen) to be used as needed for excessive shortness of breath or pain    Merton Border, MD PCCM service Mobile [phone] Pager [phone] 09/20/2016

## 2016-09-20 NOTE — Telephone Encounter (Signed)
I'm hopeful that this will just be a limited time I'll give her something that will work quickly, can be used more on an as needed basis; if sugars come down or become controlled, then just stop it She can take one pill before breakfast and one pill before evening meal if sugars are high; if normal, don't take it Side effect can be low sugars, so keep something handy to raise glucose back up if she gets jittery, shaky, etc Thank you

## 2016-09-20 NOTE — Patient Instructions (Addendum)
1) Change prednisone to 20 mg daily and stay on this until you see Dr Mortimer Fries in follow up 2) Change Imuran (Azathioprine) to 50 mg daily 3) Hold Ofev for one week (through 09/26/16), then resume Ofev once a day until you see Dr Mortimer Fries in follow up 4) Follow up in 2-3 weeks with Dr Mortimer Fries to discuss further possibility of Hospice 5) I have refilled Roxicet (Oxycodone - acetaminophen) to be used as needed for excessive shortness of breath or pain

## 2016-09-20 NOTE — Telephone Encounter (Signed)
Patients daughter Dorian Pod notified

## 2016-09-20 NOTE — Telephone Encounter (Signed)
Patients daughter called states mother has been on prednisone and it has made her sugar spike to 300-350's wants to see if you can give her something in the meantime to keep sugar under control?

## 2016-09-21 DIAGNOSIS — Z9884 Bariatric surgery status: Secondary | ICD-10-CM | POA: Diagnosis not present

## 2016-09-21 DIAGNOSIS — D472 Monoclonal gammopathy: Secondary | ICD-10-CM | POA: Diagnosis not present

## 2016-09-21 DIAGNOSIS — T380X5D Adverse effect of glucocorticoids and synthetic analogues, subsequent encounter: Secondary | ICD-10-CM | POA: Diagnosis not present

## 2016-09-21 DIAGNOSIS — J8410 Pulmonary fibrosis, unspecified: Secondary | ICD-10-CM | POA: Diagnosis not present

## 2016-09-21 DIAGNOSIS — E46 Unspecified protein-calorie malnutrition: Secondary | ICD-10-CM | POA: Diagnosis not present

## 2016-09-21 DIAGNOSIS — Z7951 Long term (current) use of inhaled steroids: Secondary | ICD-10-CM | POA: Diagnosis not present

## 2016-09-21 DIAGNOSIS — M069 Rheumatoid arthritis, unspecified: Secondary | ICD-10-CM | POA: Diagnosis not present

## 2016-09-21 DIAGNOSIS — E1165 Type 2 diabetes mellitus with hyperglycemia: Secondary | ICD-10-CM | POA: Diagnosis not present

## 2016-09-25 DIAGNOSIS — Z9884 Bariatric surgery status: Secondary | ICD-10-CM | POA: Diagnosis not present

## 2016-09-25 DIAGNOSIS — E1165 Type 2 diabetes mellitus with hyperglycemia: Secondary | ICD-10-CM | POA: Diagnosis not present

## 2016-09-25 DIAGNOSIS — M069 Rheumatoid arthritis, unspecified: Secondary | ICD-10-CM | POA: Diagnosis not present

## 2016-09-25 DIAGNOSIS — D472 Monoclonal gammopathy: Secondary | ICD-10-CM | POA: Diagnosis not present

## 2016-09-25 DIAGNOSIS — E46 Unspecified protein-calorie malnutrition: Secondary | ICD-10-CM | POA: Diagnosis not present

## 2016-09-25 DIAGNOSIS — J8410 Pulmonary fibrosis, unspecified: Secondary | ICD-10-CM | POA: Diagnosis not present

## 2016-09-25 DIAGNOSIS — T380X5D Adverse effect of glucocorticoids and synthetic analogues, subsequent encounter: Secondary | ICD-10-CM | POA: Diagnosis not present

## 2016-09-25 DIAGNOSIS — Z7951 Long term (current) use of inhaled steroids: Secondary | ICD-10-CM | POA: Diagnosis not present

## 2016-09-26 ENCOUNTER — Encounter: Payer: Self-pay | Admitting: Family Medicine

## 2016-09-26 ENCOUNTER — Ambulatory Visit (INDEPENDENT_AMBULATORY_CARE_PROVIDER_SITE_OTHER): Payer: Commercial Managed Care - HMO | Admitting: Family Medicine

## 2016-09-26 VITALS — BP 110/64 | HR 114 | Temp 98.7°F | Resp 14 | Wt 86.0 lb

## 2016-09-26 DIAGNOSIS — E43 Unspecified severe protein-calorie malnutrition: Secondary | ICD-10-CM

## 2016-09-26 DIAGNOSIS — J8410 Pulmonary fibrosis, unspecified: Secondary | ICD-10-CM

## 2016-09-26 DIAGNOSIS — L89151 Pressure ulcer of sacral region, stage 1: Secondary | ICD-10-CM | POA: Diagnosis not present

## 2016-09-26 DIAGNOSIS — L600 Ingrowing nail: Secondary | ICD-10-CM

## 2016-09-26 MED ORDER — MIRTAZAPINE 30 MG PO TABS: 30.0000 mg | ORAL_TABLET | Freq: Every day | ORAL | 1 refills | Status: DC

## 2016-09-26 MED ORDER — DUODERM CGF DRESSING EX MISC: CUTANEOUS | 0 refills | Status: AC

## 2016-09-26 MED ORDER — MIRABEGRON ER 25 MG PO TB24: 25.0000 mg | ORAL_TABLET | Freq: Every day | ORAL | 5 refills | Status: DC

## 2016-09-26 NOTE — Progress Notes (Signed)
BP 110/64   Pulse (!) 114   Temp 98.7 F (37.1 C) (Oral)   Resp 14   Wt 86 lb (39 kg)   SpO2 96%   BMI 16.80 kg/m    Subjective:    Patient ID: Anita Atkins, female    DOB: 1934/09/23, 82 y.o.   MRN: [id number]  HPI: Anita Atkins is a 82 y.o. female  Chief Complaint  Patient presents with  . Hospitalization Follow-up    SOB   She was hospitalized; note reviewed Admitted on 09/15/16 and discharged on 09/18/16, dx was dyspnea, pulmonary fibrosis She is tired and short of breath Still has a little headache; not severe headache, not having any stroke symptoms, no facial weakness, no visual changes Not drinking enough water intake; last night was awful She has a sore on her bottom and has to get adjusted; just bone there; has it quite a while She has an ingrown toenail and had a pedicure; both feet; sore along the edges; going on for a month; no red streaks going up the foot; no fevers Sugars 135 this morning; used the short medicine just a few times; sugar was up to 350 a few times on the steroids; they backed prednisone down to 10 mg a day Has bedside commode; wearing pads for urinary frequency; she has had infection before; did just finish antibiotics They talked with her in the hospital about end of life, her wishes, consider Hospice care; she is thinking about it  Depression screen [facility] 2/9 09/26/2016 02/13/2016 06/15/2015  Decreased Interest 0 0 0  Down, Depressed, Hopeless 0 1 1  PHQ - 2 Score 0 1 1   Relevant past medical, surgical, family and social history reviewed Past Medical History:  Diagnosis Date  . Diabetes mellitus without complication ([place])   . High cholesterol   . Monoclonal paraproteinemia 01/31/2016  . Pulmonary fibrosis ([place])   . RA (rheumatoid arthritis) ([place])   . Shingles   . Status post bariatric surgery 02/13/2016   Past Surgical History:  Procedure Laterality Date  . CARDIAC SURGERY    . CHOLECYSTECTOMY    . EXPLORATION POST OPERATIVE OPEN HEART      . GASTRIC BYPASS  2007   Family History  Problem Relation Age of Onset  . Heart disease Mother   . Stroke Mother   . Diabetes Sister   . Hyperlipidemia Sister   . Cancer Maternal Aunt     female  . COPD Neg Hx   . Hypertension Neg Hx    Social History  Substance Use Topics  . Smoking status: Never Smoker  . Smokeless tobacco: Never Used  . Alcohol use No   Interim medical history since last visit reviewed. Allergies and medications reviewed  Review of Systems Per HPI unless specifically indicated above     Objective:    BP 110/64   Pulse (!) 114   Temp 98.7 F (37.1 C) (Oral)   Resp 14   Wt 86 lb (39 kg)   SpO2 96%   BMI 16.80 kg/m   Wt Readings from Last 3 Encounters:  09/26/16 86 lb (39 kg)  09/20/16 95 lb (43.1 kg)  09/16/16 84 lb 11.2 oz (38.4 kg)    Physical Exam  Constitutional: No distress (but appears weak, frail, ill).  Weak and frail, appears chronically ill, seated in wheelchair  HENT:  Head: Normocephalic and atraumatic.  Cardiovascular: Regular rhythm.  Tachycardia present.   Pulmonary/Chest: Effort normal. No respiratory distress. She  has decreased breath sounds.  Abdominal: She exhibits no distension.  Neurological: She is alert. She displays no tremor.  Skin: She is not diaphoretic. No pallor.  Erythema without ulceration over sacrum in the midline; lateral margins of both great toenails culred under and slightly erythematous; no red streaking, no drainage  Psychiatric: She has a normal mood and affect.  Blunted affect; did not smile much; did not appear hopeless or despondent    Results for orders placed or performed during the hospital encounter of [id number]  Basic metabolic panel  Result Value Ref Range   Sodium 140 135 - 145 mmol/L   Potassium 3.9 3.5 - 5.1 mmol/L   Chloride 105 101 - 111 mmol/L   CO2 26 22 - 32 mmol/L   Glucose, Bld 181 (H) 65 - 99 mg/dL   BUN 21 (H) 6 - 20 mg/dL   Creatinine, Ser 0.44 0.44 - 1.00 mg/dL    Calcium 9.1 8.9 - 10.3 mg/dL   GFR calc non Af Amer >60 >60 mL/min   GFR calc Af Amer >60 >60 mL/min   Anion gap 9 5 - 15  CBC  Result Value Ref Range   WBC 7.4 3.6 - 11.0 K/uL   RBC 3.58 (L) 3.80 - 5.20 MIL/uL   Hemoglobin 13.2 12.0 - 16.0 g/dL   HCT 36.9 35.0 - 47.0 %   MCV 103.2 (H) 80.0 - 100.0 fL   MCH 37.0 (H) 26.0 - 34.0 pg   MCHC 35.9 32.0 - 36.0 g/dL   RDW 14.2 11.5 - 14.5 %   Platelets 241 150 - 440 K/uL  Troponin I  Result Value Ref Range   Troponin I <0.03 <0.03 ng/mL  Urinalysis, Complete w Microscopic  Result Value Ref Range   Color, Urine YELLOW (A) YELLOW   APPearance CLEAR (A) CLEAR   Specific Gravity, Urine 1.027 1.005 - 1.030   pH 6.0 5.0 - 8.0   Glucose, UA 50 (A) NEGATIVE mg/dL   Hgb urine dipstick NEGATIVE NEGATIVE   Bilirubin Urine NEGATIVE NEGATIVE   Ketones, ur NEGATIVE NEGATIVE mg/dL   Protein, ur NEGATIVE NEGATIVE mg/dL   Nitrite NEGATIVE NEGATIVE   Leukocytes, UA TRACE (A) NEGATIVE   RBC / HPF 0-5 0 - 5 RBC/hpf   WBC, UA 6-30 0 - 5 WBC/hpf   Bacteria, UA RARE (A) NONE SEEN   Squamous Epithelial / LPF 0-5 (A) NONE SEEN   Mucous PRESENT    Hyaline Casts, UA PRESENT   Glucose, capillary  Result Value Ref Range   Glucose-Capillary 154 (H) 65 - 99 mg/dL  Basic metabolic panel  Result Value Ref Range   Sodium 140 135 - 145 mmol/L   Potassium 3.4 (L) 3.5 - 5.1 mmol/L   Chloride 108 101 - 111 mmol/L   CO2 24 22 - 32 mmol/L   Glucose, Bld 230 (H) 65 - 99 mg/dL   BUN 18 6 - 20 mg/dL   Creatinine, Ser 0.58 0.44 - 1.00 mg/dL   Calcium 8.3 (L) 8.9 - 10.3 mg/dL   GFR calc non Af Amer >60 >60 mL/min   GFR calc Af Amer >60 >60 mL/min   Anion gap 8 5 - 15  CBC  Result Value Ref Range   WBC 8.6 3.6 - 11.0 K/uL   RBC 3.49 (L) 3.80 - 5.20 MIL/uL   Hemoglobin 12.8 12.0 - 16.0 g/dL   HCT 37.0 35.0 - 47.0 %   MCV 106.1 (H) 80.0 - 100.0 fL   MCH  36.7 (H) 26.0 - 34.0 pg   MCHC 34.6 32.0 - 36.0 g/dL   RDW 14.7 (H) 11.5 - 14.5 %   Platelets 200 150 -  440 K/uL  Glucose, capillary  Result Value Ref Range   Glucose-Capillary 323 (H) 65 - 99 mg/dL  Glucose, capillary  Result Value Ref Range   Glucose-Capillary 320 (H) 65 - 99 mg/dL   Comment 1 Notify RN   Glucose, capillary  Result Value Ref Range   Glucose-Capillary 98 65 - 99 mg/dL   Comment 1 Notify RN   Glucose, capillary  Result Value Ref Range   Glucose-Capillary 200 (H) 65 - 99 mg/dL  Glucose, capillary  Result Value Ref Range   Glucose-Capillary 152 (H) 65 - 99 mg/dL  Glucose, capillary  Result Value Ref Range   Glucose-Capillary 248 (H) 65 - 99 mg/dL   Comment 1 Notify RN   Glucose, capillary  Result Value Ref Range   Glucose-Capillary 137 (H) 65 - 99 mg/dL   Comment 1 Notify RN   Glucose, capillary  Result Value Ref Range   Glucose-Capillary 278 (H) 65 - 99 mg/dL  Glucose, capillary  Result Value Ref Range   Glucose-Capillary 154 (H) 65 - 99 mg/dL  Glucose, capillary  Result Value Ref Range   Glucose-Capillary 190 (H) 65 - 99 mg/dL      Assessment & Plan:   Problem List Items Addressed This Visit      Respiratory   Pulmonary fibrosis (HCC) (Chronic)    Patient recently hospitalized; has had Hospice brought up to her as an option; she is thinking about this, will consider, and will talk with her pulmonary doctor        Musculoskeletal and Integument   Decubitus ulcer of sacral region, stage 1    Discussed dx; will use protective covering (Duoderm), and have her take vitamin C and zinc for two weeks and two weeks only; allevation of pressure to that area is key        Other   Protein-calorie malnutrition, severe    Leading to pressure injury of skin over sacrum; nutrition, vitamin C, zinc (for 2 weeks only); remeron for appetite stimulation; discussed marinol which is another option       Other Visit Diagnoses    Ingrown toenail    -  Primary   refer to podiatrist   Relevant Orders   Ambulatory referral to Podiatry       Follow up  plan: Return in about 3 months (around 12/24/2016) for follow-up.  An after-visit summary was printed and given to the patient at [facility].  Please see the patient instructions which may contain other information and recommendations beyond what is mentioned above in the assessment and plan.  Meds ordered this encounter  Medications  . Control Gel Formula Dressing (DUODERM CGF DRESSING) MISC    Sig: Apply duoderm over sore every 3 days; remove and replace    Dispense:  5 each    Refill:  0  . mirtazapine (REMERON) 30 MG tablet    Sig: Take 1 tablet (30 mg total) by mouth at bedtime.    Dispense:  90 tablet    Refill:  1    Increasing the dose  . mirabegron ER (MYRBETRIQ) 25 MG TB24 tablet    Sig: Take 1 tablet (25 mg total) by mouth daily.    Dispense:  30 tablet    Refill:  5    Orders Placed This Encounter  Procedures  .  Ambulatory referral to Podiatry   Face-to-face time with patient was more than 25 minutes, >50% time spent counseling and coordination of care

## 2016-09-26 NOTE — Patient Instructions (Addendum)
Use the duoderm over the sore place, remove and replace every 3 days For two weeks, take 500 mg of vitamin C daily and 220 mg of zinc daily Use pressure relief cushion for the back side Let me know how I can help Do hydrate Talk with your pharmacist first about the Myrbetriq that I just prescribed in regards to your other medicines

## 2016-09-27 ENCOUNTER — Ambulatory Visit: Payer: Commercial Managed Care - HMO | Admitting: Family Medicine

## 2016-10-01 DIAGNOSIS — Z9884 Bariatric surgery status: Secondary | ICD-10-CM | POA: Diagnosis not present

## 2016-10-01 DIAGNOSIS — E1165 Type 2 diabetes mellitus with hyperglycemia: Secondary | ICD-10-CM | POA: Diagnosis not present

## 2016-10-01 DIAGNOSIS — E46 Unspecified protein-calorie malnutrition: Secondary | ICD-10-CM | POA: Diagnosis not present

## 2016-10-01 DIAGNOSIS — J8410 Pulmonary fibrosis, unspecified: Secondary | ICD-10-CM | POA: Diagnosis not present

## 2016-10-01 DIAGNOSIS — D472 Monoclonal gammopathy: Secondary | ICD-10-CM | POA: Diagnosis not present

## 2016-10-01 DIAGNOSIS — T380X5D Adverse effect of glucocorticoids and synthetic analogues, subsequent encounter: Secondary | ICD-10-CM | POA: Diagnosis not present

## 2016-10-01 DIAGNOSIS — Z7951 Long term (current) use of inhaled steroids: Secondary | ICD-10-CM | POA: Diagnosis not present

## 2016-10-01 DIAGNOSIS — M069 Rheumatoid arthritis, unspecified: Secondary | ICD-10-CM | POA: Diagnosis not present

## 2016-10-04 DIAGNOSIS — E46 Unspecified protein-calorie malnutrition: Secondary | ICD-10-CM | POA: Diagnosis not present

## 2016-10-04 DIAGNOSIS — T380X5D Adverse effect of glucocorticoids and synthetic analogues, subsequent encounter: Secondary | ICD-10-CM | POA: Diagnosis not present

## 2016-10-04 DIAGNOSIS — J8410 Pulmonary fibrosis, unspecified: Secondary | ICD-10-CM | POA: Diagnosis not present

## 2016-10-04 DIAGNOSIS — Z9884 Bariatric surgery status: Secondary | ICD-10-CM | POA: Diagnosis not present

## 2016-10-04 DIAGNOSIS — D472 Monoclonal gammopathy: Secondary | ICD-10-CM | POA: Diagnosis not present

## 2016-10-04 DIAGNOSIS — E1165 Type 2 diabetes mellitus with hyperglycemia: Secondary | ICD-10-CM | POA: Diagnosis not present

## 2016-10-04 DIAGNOSIS — Z7951 Long term (current) use of inhaled steroids: Secondary | ICD-10-CM | POA: Diagnosis not present

## 2016-10-04 DIAGNOSIS — M069 Rheumatoid arthritis, unspecified: Secondary | ICD-10-CM | POA: Diagnosis not present

## 2016-10-07 DIAGNOSIS — L89151 Pressure ulcer of sacral region, stage 1: Secondary | ICD-10-CM | POA: Insufficient documentation

## 2016-10-07 NOTE — Assessment & Plan Note (Signed)
Leading to pressure injury of skin over sacrum; nutrition, vitamin C, zinc (for 2 weeks only); remeron for appetite stimulation; discussed marinol which is another option

## 2016-10-07 NOTE — Assessment & Plan Note (Signed)
Patient recently hospitalized; has had Hospice brought up to her as an option; she is thinking about this, will consider, and will talk with her pulmonary doctor

## 2016-10-07 NOTE — Assessment & Plan Note (Signed)
Discussed dx; will use protective covering (Duoderm), and have her take vitamin C and zinc for two weeks and two weeks only; allevation of pressure to that area is key

## 2016-10-08 ENCOUNTER — Ambulatory Visit (INDEPENDENT_AMBULATORY_CARE_PROVIDER_SITE_OTHER): Payer: Medicare HMO | Admitting: Internal Medicine

## 2016-10-08 ENCOUNTER — Encounter: Payer: Self-pay | Admitting: Internal Medicine

## 2016-10-08 VITALS — BP 122/68 | HR 103

## 2016-10-08 DIAGNOSIS — J9611 Chronic respiratory failure with hypoxia: Secondary | ICD-10-CM

## 2016-10-08 NOTE — Patient Instructions (Signed)
Continue Prednisone 20 mg daily Continue OFEV 150mg  daily Continue inhalers as prescibed Continue oxygen as prescribed

## 2016-10-08 NOTE — Progress Notes (Signed)
[facility] Consultation      Date: 10/08/2016,   MRN# [id number] Anita Atkins [birth year] Code Status:  Hosp day:@LENGTHOFSTAYDAYS @ Referring MD: @ATDPROV @     PCP:      Admission                  Current  Anita Atkins is a 82 y.o. old female seen in consultation for pulmonary fibrosis     CHIEF COMPLAINT:  Follow Chronic SOB and WOB, follow up pneumonia H/o pulmonary fibrosis    HISTORY OF PRESENT ILLNESS  82 yo white female follow up for for previous dx of pulm fibrosis apporx 10-15 years ago CT chest 05/04/16 shows extensive b/l interstitial opacities c/w fibrosis Feels very tired all the time very fatigued Her symptoms are SOB and DOE chronic, associated with weakness and palpitations with exertion, Patient risk factors includes heavy second hand smoke exposure,  Has re-started  OFEV 1 at 150 mg daily Also on prednisone 20 mg daily Patient has no fevers, chills at this time, no evidence of infection Patient sitting up and breathing comfortably, looks ill, cachectic  Was d/c home on oxygen therapy, n ow on 2 L [place]  Office Spiro FVC 0.99L 43%predicted  ONO 06/2015 negative ONO on 04/16/16 Negative   Patient not feeling well has left sided pleuritic chest pain Last time she had this, she ws dx with pneumonia   Overall prognosis is very poor, I had lengthy discussion about QOL and daily activities,  Patient getting Home health with Hospice  I have started discussion about DNR status  Overall, prognosis     Current Medication:  Current Outpatient Prescriptions:  .  acetaminophen (TYLENOL) 500 MG tablet, Take 500 mg by mouth every 6 (six) hours as needed. , Disp: , Rfl:  .  alendronate (FOSAMAX) 70 MG tablet, Take 1 tablet by mouth once a week., Disp: , Rfl:  .  aspirin EC 81 MG tablet, Take 81 mg by mouth daily., Disp: , Rfl:  .  azaTHIOprine (IMURAN) 50 MG tablet, Take 75 mg by mouth daily. Patient takes 1 & 1/2 tabs of 50 mg, Disp: , Rfl:    .  azelastine (ASTELIN) 0.1 % nasal spray, Place 1 spray into both nostrils 2 (two) times daily as needed. , Disp: , Rfl:  .  Control Gel Formula Dressing (DUODERM CGF DRESSING) MISC, Apply duoderm over sore every 3 days; remove and replace, Disp: 5 each, Rfl: 0 .  diltiazem (CARDIZEM CD) 120 MG 24 hr capsule, Take 1 capsule (120 mg total) by mouth at bedtime., Disp: 30 capsule, Rfl: 0 .  feeding supplement (BOOST / RESOURCE BREEZE) LIQD, Take 1 Container by mouth 3 (three) times daily between meals., Disp: 237 mL, Rfl: 0 .  glipiZIDE (GLUCOTROL) 5 MG tablet, Take 1 tablet (5 mg total) by mouth 2 (two) times daily before a meal. If needed for high sugars, Disp: 60 tablet, Rfl: 0 .  glucose blood (ACCU-CHEK AVIVA) test strip, Use as instructed to check sugars once a day; E11.9, LON 99 months, Disp: 100 each, Rfl: 1 .  ipratropium-albuterol (DUONEB) 0.5-2.5 (3) MG/3ML SOLN, Take 3 mLs by nebulization every 4 (four) hours as needed., Disp: 360 mL, Rfl: 10 .  leflunomide (ARAVA) 20 MG tablet, Take 20 mg by mouth daily., Disp: , Rfl:  .  mirabegron ER (MYRBETRIQ) 25 MG TB24 tablet, Take 1 tablet (25 mg total) by mouth daily., Disp: 30 tablet, Rfl: 5 .  mirtazapine (REMERON) 30  MG tablet, Take 1 tablet (30 mg total) by mouth at bedtime., Disp: 90 tablet, Rfl: 1 .  OFEV 150 MG CAPS, Take 150 mg by mouth 2 (two) times daily. , Disp: , Rfl:  .  oxyCODONE-acetaminophen (ROXICET) 5-325 MG tablet, Take 1 tablet by mouth every 4 (four) hours as needed., Disp: 30 tablet, Rfl: 0 .  oxyCODONE-acetaminophen (ROXICET) 5-325 MG tablet, Take 1 tablet by mouth every 6 (six) hours as needed for moderate pain or severe pain (excessive shortness of breath)., Disp: 60 tablet, Rfl: 0 .  pantoprazole (PROTONIX) 20 MG tablet, Take 1 tablet (20 mg total) by mouth daily., Disp: 90 tablet, Rfl: 8 .  predniSONE (DELTASONE) 10 MG tablet, 4 tablets for two days; 3 tablets for two days; 2 tablets for two days; 1 tablet for two days;  1/2 tablet daily afterwards (Patient taking differently: 4 tablets for two days; 3 tablets for two days; 2 tablets for two days; 1 tablet for two days; 1/2 tablet daily afterwards), Disp: 50 tablet, Rfl: 0 .  SYMBICORT 160-4.5 MCG/ACT inhaler, Inhale 2 puffs into the lungs 2 (two) times daily., Disp: 1 Inhaler, Rfl: 5 .  VENTOLIN HFA 108 (90 Base) MCG/ACT inhaler, Inhale 2 puffs into the lungs every 6 (six) hours as needed., Disp: 1 Inhaler, Rfl: 2 .  benzonatate (TESSALON) 100 MG capsule, Take 1 capsule (100 mg total) by mouth 3 (three) times daily as needed for cough. (Patient not taking: Reported on 10/08/2016), Disp: 120 capsule, Rfl: 6    ALLERGIES   Plaquenil [hydroxychloroquine sulfate]     REVIEW OF SYSTEMS   Review of Systems  Constitutional: Negative for chills, fever, malaise/fatigue and weight loss.  HENT: Negative for congestion.   Eyes: Negative for blurred vision and double vision.  Respiratory: Positive for shortness of breath. Negative for cough.   Cardiovascular: Negative for chest pain, palpitations and leg swelling.  Gastrointestinal: Negative for heartburn.  Genitourinary: Negative.   Skin: Negative for rash.  Neurological: Negative for dizziness, weakness and headaches.  All other systems reviewed and are negative.    VS:BP 122/68 (BP Location: Left Arm, Cuff Size: Normal)   Pulse (!) 103   SpO2 98%     PHYSICAL EXAM  Physical Exam  Constitutional: She is oriented to person, place, and time. No distress.  Thin and cachectic  Eyes: EOM are normal.  Cardiovascular: Normal rate, regular rhythm and normal heart sounds.   No murmur heard. Pulmonary/Chest: No stridor. No respiratory distress. She has no wheezes. She has rales.  Musculoskeletal: Normal range of motion. She exhibits no edema.  Neurological: She is alert and oriented to person, place, and time.  Skin: She is not diaphoretic.  Psychiatric: She has a normal mood and affect.         CXR  on 03/12/16 Images reviewed RLL opacity seen   CXR 8/7 Images Previous RLL opacity has resolved  CT chest 05/04/16 extensive b/l honeycomb pattern Images reviewed  no lung masses       ASSESSMENT/PLAN   82 yo very pleasant white female with Pulmonary Fibrosis(no biopsy) for many years with chronic SOB and DOE with chronic cough  Very poor prognosis and very poor resp status  1.continue inhaled symbicort, albuterol as needed 2.contiue PPI 3..continue cough suppressant as needed with tessolon perles 4..follow up with cardiology as needed with dr Rockey Situ as needed 5.follow up GI consults for h/o gastric bypass as needed 6.continue OFEV 150 mg daily and prednisone 20 mg daily  as prescribed 7. Combivent Nebs every 4 hrs as needed for excessive bronchospasms and coughing spells 8.pain meds as needed 9.continue HH with hospice    End of life goals of care discussed, patient is aware of poor resp status, daughters at visit Patient understands that she needs help and family is supportive.     The Patient requires high complexity decision making for assessment and support, frequent evaluation and titration of therapies, application of advanced monitoring technologies and extensive interpretation of multiple databases.  Patient satisfied with Plan of action and management. All questions answered  Corrin Parker, M.D.  [facility]  Medical Director [place] Director [facility] Cardio-Pulmonary Department

## 2016-10-09 DIAGNOSIS — J181 Lobar pneumonia, unspecified organism: Secondary | ICD-10-CM | POA: Diagnosis not present

## 2016-10-09 DIAGNOSIS — Z9884 Bariatric surgery status: Secondary | ICD-10-CM | POA: Diagnosis not present

## 2016-10-09 DIAGNOSIS — E1165 Type 2 diabetes mellitus with hyperglycemia: Secondary | ICD-10-CM | POA: Diagnosis not present

## 2016-10-09 DIAGNOSIS — T380X5D Adverse effect of glucocorticoids and synthetic analogues, subsequent encounter: Secondary | ICD-10-CM | POA: Diagnosis not present

## 2016-10-09 DIAGNOSIS — D472 Monoclonal gammopathy: Secondary | ICD-10-CM | POA: Diagnosis not present

## 2016-10-09 DIAGNOSIS — Z7951 Long term (current) use of inhaled steroids: Secondary | ICD-10-CM | POA: Diagnosis not present

## 2016-10-09 DIAGNOSIS — M069 Rheumatoid arthritis, unspecified: Secondary | ICD-10-CM | POA: Diagnosis not present

## 2016-10-09 DIAGNOSIS — J8410 Pulmonary fibrosis, unspecified: Secondary | ICD-10-CM | POA: Diagnosis not present

## 2016-10-09 DIAGNOSIS — E46 Unspecified protein-calorie malnutrition: Secondary | ICD-10-CM | POA: Diagnosis not present

## 2016-10-11 ENCOUNTER — Other Ambulatory Visit: Payer: Self-pay | Admitting: *Deleted

## 2016-10-11 ENCOUNTER — Telehealth: Payer: Self-pay | Admitting: Family Medicine

## 2016-10-11 ENCOUNTER — Other Ambulatory Visit: Payer: Self-pay

## 2016-10-11 ENCOUNTER — Telehealth: Payer: Self-pay | Admitting: Internal Medicine

## 2016-10-11 DIAGNOSIS — J8410 Pulmonary fibrosis, unspecified: Secondary | ICD-10-CM | POA: Diagnosis not present

## 2016-10-11 DIAGNOSIS — M069 Rheumatoid arthritis, unspecified: Secondary | ICD-10-CM | POA: Diagnosis not present

## 2016-10-11 DIAGNOSIS — Z7951 Long term (current) use of inhaled steroids: Secondary | ICD-10-CM | POA: Diagnosis not present

## 2016-10-11 DIAGNOSIS — Z9884 Bariatric surgery status: Secondary | ICD-10-CM | POA: Diagnosis not present

## 2016-10-11 DIAGNOSIS — T380X5D Adverse effect of glucocorticoids and synthetic analogues, subsequent encounter: Secondary | ICD-10-CM | POA: Diagnosis not present

## 2016-10-11 DIAGNOSIS — E46 Unspecified protein-calorie malnutrition: Secondary | ICD-10-CM | POA: Diagnosis not present

## 2016-10-11 DIAGNOSIS — E1165 Type 2 diabetes mellitus with hyperglycemia: Secondary | ICD-10-CM | POA: Diagnosis not present

## 2016-10-11 DIAGNOSIS — D472 Monoclonal gammopathy: Secondary | ICD-10-CM | POA: Diagnosis not present

## 2016-10-11 MED ORDER — DILTIAZEM HCL ER COATED BEADS 120 MG PO CP24: 120.0000 mg | ORAL_CAPSULE | Freq: Every day | ORAL | 0 refills | Status: DC

## 2016-10-11 NOTE — Telephone Encounter (Signed)
Pt daughter calling asking to please call in refill they accidentally called the PCP when they needed to call Dr Donivan Scull office for the refill.   *STAT* If patient is at the pharmacy, call can be transferred to refill team.   1. Which medications need to be refilled? (please list name of each medication and dose if known)  Diltizem  2. Which pharmacy/location (including street and city if local pharmacy) is medication to be sent to? [facility] in [place]   3. Do they need a 30 day or 90 day supply? 90 day

## 2016-10-11 NOTE — Telephone Encounter (Signed)
Please have her contact her cardiologist for that medicine; thank you

## 2016-10-11 NOTE — Telephone Encounter (Signed)
Patient family notified

## 2016-10-11 NOTE — Telephone Encounter (Signed)
Larena Glassman from [facility] called, she would like a call back regarding a IV for the patient. Please return her call. [phone]

## 2016-10-11 NOTE — Telephone Encounter (Signed)
Requested Prescriptions   Signed Prescriptions Disp Refills  . diltiazem (CARDIZEM CD) 120 MG 24 hr capsule 90 capsule 0    Sig: Take 1 capsule (120 mg total) by mouth at bedtime.    Authorizing Provider: Minna Merritts    Ordering User: Britt Bottom

## 2016-10-12 NOTE — Telephone Encounter (Signed)
Nurse Larena Glassman called back from [facility] patients family was stating that patient needed iv fluids for dehydration?  Nurse states she is eating and drinking and does not feel she needs one at this time.  She says patient is not really complaint and all she does is lay in the bed.  Please advise whether you wanted her to have IV fluids, nurse thinks it will not solve the problem, she is under weight and has no thrive.

## 2016-10-12 NOTE — Telephone Encounter (Signed)
If patient is eating and drinking, I don't think IV hydration is necessary; I agree with the nurse's assessment I appreciate the family's concern

## 2016-10-15 DIAGNOSIS — Z7951 Long term (current) use of inhaled steroids: Secondary | ICD-10-CM | POA: Diagnosis not present

## 2016-10-15 DIAGNOSIS — J8410 Pulmonary fibrosis, unspecified: Secondary | ICD-10-CM | POA: Diagnosis not present

## 2016-10-15 DIAGNOSIS — E1165 Type 2 diabetes mellitus with hyperglycemia: Secondary | ICD-10-CM | POA: Diagnosis not present

## 2016-10-15 DIAGNOSIS — M069 Rheumatoid arthritis, unspecified: Secondary | ICD-10-CM | POA: Diagnosis not present

## 2016-10-15 DIAGNOSIS — T380X5D Adverse effect of glucocorticoids and synthetic analogues, subsequent encounter: Secondary | ICD-10-CM | POA: Diagnosis not present

## 2016-10-15 DIAGNOSIS — D472 Monoclonal gammopathy: Secondary | ICD-10-CM | POA: Diagnosis not present

## 2016-10-15 DIAGNOSIS — Z9884 Bariatric surgery status: Secondary | ICD-10-CM | POA: Diagnosis not present

## 2016-10-15 DIAGNOSIS — E46 Unspecified protein-calorie malnutrition: Secondary | ICD-10-CM | POA: Diagnosis not present

## 2016-10-17 DIAGNOSIS — E1165 Type 2 diabetes mellitus with hyperglycemia: Secondary | ICD-10-CM | POA: Diagnosis not present

## 2016-10-17 DIAGNOSIS — J8410 Pulmonary fibrosis, unspecified: Secondary | ICD-10-CM | POA: Diagnosis not present

## 2016-10-17 DIAGNOSIS — Z7951 Long term (current) use of inhaled steroids: Secondary | ICD-10-CM | POA: Diagnosis not present

## 2016-10-17 DIAGNOSIS — D472 Monoclonal gammopathy: Secondary | ICD-10-CM | POA: Diagnosis not present

## 2016-10-17 DIAGNOSIS — T380X5D Adverse effect of glucocorticoids and synthetic analogues, subsequent encounter: Secondary | ICD-10-CM | POA: Diagnosis not present

## 2016-10-17 DIAGNOSIS — M069 Rheumatoid arthritis, unspecified: Secondary | ICD-10-CM | POA: Diagnosis not present

## 2016-10-17 DIAGNOSIS — E46 Unspecified protein-calorie malnutrition: Secondary | ICD-10-CM | POA: Diagnosis not present

## 2016-10-17 DIAGNOSIS — Z9884 Bariatric surgery status: Secondary | ICD-10-CM | POA: Diagnosis not present

## 2016-10-19 DIAGNOSIS — L8990 Pressure ulcer of unspecified site, unspecified stage: Secondary | ICD-10-CM | POA: Diagnosis not present

## 2016-10-19 DIAGNOSIS — I2510 Atherosclerotic heart disease of native coronary artery without angina pectoris: Secondary | ICD-10-CM | POA: Diagnosis not present

## 2016-10-19 DIAGNOSIS — M069 Rheumatoid arthritis, unspecified: Secondary | ICD-10-CM | POA: Diagnosis not present

## 2016-10-19 DIAGNOSIS — R269 Unspecified abnormalities of gait and mobility: Secondary | ICD-10-CM | POA: Diagnosis not present

## 2016-10-19 DIAGNOSIS — R531 Weakness: Secondary | ICD-10-CM | POA: Diagnosis not present

## 2016-10-22 DIAGNOSIS — Z9884 Bariatric surgery status: Secondary | ICD-10-CM | POA: Diagnosis not present

## 2016-10-22 DIAGNOSIS — D472 Monoclonal gammopathy: Secondary | ICD-10-CM | POA: Diagnosis not present

## 2016-10-22 DIAGNOSIS — M069 Rheumatoid arthritis, unspecified: Secondary | ICD-10-CM | POA: Diagnosis not present

## 2016-10-22 DIAGNOSIS — T380X5D Adverse effect of glucocorticoids and synthetic analogues, subsequent encounter: Secondary | ICD-10-CM | POA: Diagnosis not present

## 2016-10-22 DIAGNOSIS — Z7951 Long term (current) use of inhaled steroids: Secondary | ICD-10-CM | POA: Diagnosis not present

## 2016-10-22 DIAGNOSIS — J8410 Pulmonary fibrosis, unspecified: Secondary | ICD-10-CM | POA: Diagnosis not present

## 2016-10-22 DIAGNOSIS — C44622 Squamous cell carcinoma of skin of right upper limb, including shoulder: Secondary | ICD-10-CM | POA: Diagnosis not present

## 2016-10-22 DIAGNOSIS — E1165 Type 2 diabetes mellitus with hyperglycemia: Secondary | ICD-10-CM | POA: Diagnosis not present

## 2016-10-22 DIAGNOSIS — E46 Unspecified protein-calorie malnutrition: Secondary | ICD-10-CM | POA: Diagnosis not present

## 2016-10-23 ENCOUNTER — Telehealth: Payer: Self-pay | Admitting: Internal Medicine

## 2016-10-23 NOTE — Telephone Encounter (Signed)
Anita Atkins is faxing OV note and demographics. Nothing further needed.

## 2016-10-23 NOTE — Telephone Encounter (Signed)
Patient daughter has requested in home palliative care for patient.  Hospice has faxed over request.  Please call with additional questions or concerns.

## 2016-10-23 NOTE — Telephone Encounter (Signed)
Hospice needs demographics and last OV faxed at [phone]

## 2016-10-24 DIAGNOSIS — Z9884 Bariatric surgery status: Secondary | ICD-10-CM | POA: Diagnosis not present

## 2016-10-24 DIAGNOSIS — E1165 Type 2 diabetes mellitus with hyperglycemia: Secondary | ICD-10-CM | POA: Diagnosis not present

## 2016-10-24 DIAGNOSIS — D472 Monoclonal gammopathy: Secondary | ICD-10-CM | POA: Diagnosis not present

## 2016-10-24 DIAGNOSIS — M069 Rheumatoid arthritis, unspecified: Secondary | ICD-10-CM | POA: Diagnosis not present

## 2016-10-24 DIAGNOSIS — J8410 Pulmonary fibrosis, unspecified: Secondary | ICD-10-CM | POA: Diagnosis not present

## 2016-10-24 DIAGNOSIS — E46 Unspecified protein-calorie malnutrition: Secondary | ICD-10-CM | POA: Diagnosis not present

## 2016-10-24 DIAGNOSIS — T380X5D Adverse effect of glucocorticoids and synthetic analogues, subsequent encounter: Secondary | ICD-10-CM | POA: Diagnosis not present

## 2016-10-24 DIAGNOSIS — Z7951 Long term (current) use of inhaled steroids: Secondary | ICD-10-CM | POA: Diagnosis not present

## 2016-10-30 DIAGNOSIS — J8410 Pulmonary fibrosis, unspecified: Secondary | ICD-10-CM | POA: Diagnosis not present

## 2016-10-30 DIAGNOSIS — T380X5D Adverse effect of glucocorticoids and synthetic analogues, subsequent encounter: Secondary | ICD-10-CM | POA: Diagnosis not present

## 2016-10-30 DIAGNOSIS — M069 Rheumatoid arthritis, unspecified: Secondary | ICD-10-CM | POA: Diagnosis not present

## 2016-10-30 DIAGNOSIS — Z9884 Bariatric surgery status: Secondary | ICD-10-CM | POA: Diagnosis not present

## 2016-10-30 DIAGNOSIS — E46 Unspecified protein-calorie malnutrition: Secondary | ICD-10-CM | POA: Diagnosis not present

## 2016-10-30 DIAGNOSIS — Z7951 Long term (current) use of inhaled steroids: Secondary | ICD-10-CM | POA: Diagnosis not present

## 2016-10-30 DIAGNOSIS — D472 Monoclonal gammopathy: Secondary | ICD-10-CM | POA: Diagnosis not present

## 2016-10-30 DIAGNOSIS — E1165 Type 2 diabetes mellitus with hyperglycemia: Secondary | ICD-10-CM | POA: Diagnosis not present

## 2016-11-15 DIAGNOSIS — D472 Monoclonal gammopathy: Secondary | ICD-10-CM | POA: Diagnosis not present

## 2016-11-15 DIAGNOSIS — T380X5D Adverse effect of glucocorticoids and synthetic analogues, subsequent encounter: Secondary | ICD-10-CM | POA: Diagnosis not present

## 2016-11-15 DIAGNOSIS — Z7951 Long term (current) use of inhaled steroids: Secondary | ICD-10-CM | POA: Diagnosis not present

## 2016-11-15 DIAGNOSIS — M069 Rheumatoid arthritis, unspecified: Secondary | ICD-10-CM | POA: Diagnosis not present

## 2016-11-15 DIAGNOSIS — E46 Unspecified protein-calorie malnutrition: Secondary | ICD-10-CM | POA: Diagnosis not present

## 2016-11-15 DIAGNOSIS — J8410 Pulmonary fibrosis, unspecified: Secondary | ICD-10-CM | POA: Diagnosis not present

## 2016-11-15 DIAGNOSIS — E1165 Type 2 diabetes mellitus with hyperglycemia: Secondary | ICD-10-CM | POA: Diagnosis not present

## 2016-11-15 DIAGNOSIS — Z9884 Bariatric surgery status: Secondary | ICD-10-CM | POA: Diagnosis not present

## 2016-11-16 DIAGNOSIS — L8990 Pressure ulcer of unspecified site, unspecified stage: Secondary | ICD-10-CM | POA: Diagnosis not present

## 2016-11-16 DIAGNOSIS — I2510 Atherosclerotic heart disease of native coronary artery without angina pectoris: Secondary | ICD-10-CM | POA: Diagnosis not present

## 2016-11-16 DIAGNOSIS — R531 Weakness: Secondary | ICD-10-CM | POA: Diagnosis not present

## 2016-11-16 DIAGNOSIS — R269 Unspecified abnormalities of gait and mobility: Secondary | ICD-10-CM | POA: Diagnosis not present

## 2016-11-16 DIAGNOSIS — M069 Rheumatoid arthritis, unspecified: Secondary | ICD-10-CM | POA: Diagnosis not present

## 2016-11-27 ENCOUNTER — Encounter: Payer: Self-pay | Admitting: Family Medicine

## 2016-11-27 ENCOUNTER — Ambulatory Visit (INDEPENDENT_AMBULATORY_CARE_PROVIDER_SITE_OTHER): Payer: Medicare HMO | Admitting: Family Medicine

## 2016-11-27 DIAGNOSIS — Z5181 Encounter for therapeutic drug level monitoring: Secondary | ICD-10-CM | POA: Diagnosis not present

## 2016-11-27 DIAGNOSIS — R3915 Urgency of urination: Secondary | ICD-10-CM | POA: Insufficient documentation

## 2016-11-27 DIAGNOSIS — J8410 Pulmonary fibrosis, unspecified: Secondary | ICD-10-CM | POA: Diagnosis not present

## 2016-11-27 DIAGNOSIS — E43 Unspecified severe protein-calorie malnutrition: Secondary | ICD-10-CM | POA: Diagnosis not present

## 2016-11-27 DIAGNOSIS — E119 Type 2 diabetes mellitus without complications: Secondary | ICD-10-CM | POA: Diagnosis not present

## 2016-11-27 DIAGNOSIS — E1142 Type 2 diabetes mellitus with diabetic polyneuropathy: Secondary | ICD-10-CM | POA: Diagnosis not present

## 2016-11-27 DIAGNOSIS — E7800 Pure hypercholesterolemia, unspecified: Secondary | ICD-10-CM | POA: Diagnosis not present

## 2016-11-27 DIAGNOSIS — D485 Neoplasm of uncertain behavior of skin: Secondary | ICD-10-CM

## 2016-11-27 LAB — LIPID PANEL
Cholesterol: 263 mg/dL — ABNORMAL HIGH (ref <200)
HDL: 69 mg/dL (ref >50)
LDL Cholesterol: 150 mg/dL — ABNORMAL HIGH (ref <100)
TRIGLYCERIDES: 221 mg/dL — AB (ref <150)
Total CHOL/HDL Ratio: 3.8 Ratio (ref <5.0)
VLDL: 44 mg/dL — ABNORMAL HIGH (ref <30)

## 2016-11-27 LAB — TSH: TSH: 2.54 m[IU]/L

## 2016-11-27 LAB — COMPLETE METABOLIC PANEL WITH GFR
ALBUMIN: 3.7 g/dL (ref 3.6–5.1)
ALK PHOS: 79 U/L (ref 33–130)
ALT: 25 U/L (ref 6–29)
AST: 34 U/L (ref 10–35)
BILIRUBIN TOTAL: 0.4 mg/dL (ref 0.2–1.2)
BUN: 24 mg/dL (ref 7–25)
CALCIUM: 9.1 mg/dL (ref 8.6–10.4)
CO2: 31 mmol/L (ref 20–31)
Chloride: 103 mmol/L (ref 98–110)
Creat: 0.58 mg/dL — ABNORMAL LOW (ref 0.60–0.88)
GFR, EST NON AFRICAN AMERICAN: 87 mL/min (ref >=60)
Glucose, Bld: 233 mg/dL — ABNORMAL HIGH (ref 65–99)
Potassium: 5.6 mmol/L — ABNORMAL HIGH (ref 3.5–5.3)
Sodium: 142 mmol/L (ref 135–146)
TOTAL PROTEIN: 6.7 g/dL (ref 6.1–8.1)

## 2016-11-27 NOTE — Assessment & Plan Note (Signed)
Check labs 

## 2016-11-27 NOTE — Assessment & Plan Note (Signed)
Followed by Dr. Mortimer Fries; on round the clock O2

## 2016-11-27 NOTE — Progress Notes (Signed)
BP 120/64   Pulse 100   Temp 97.7 F (36.5 C) (Oral)   Resp 16   Wt 85 lb 12.8 oz (38.9 kg)   SpO2 95% Comment: 2L o2  BMI 16.76 kg/m    Subjective:    Patient ID: Anita Atkins, female    DOB: Sep 02, 1934, 82 y.o.   MRN: [id number]  HPI: Anita Atkins is a 82 y.o. female  Chief Complaint  Patient presents with  . Follow-up    3 month   Patient is here with her daughters Her breathing is doing pretty well overall The oxygen is making a difference she says Enjoyed the sunshine yesterday, got outside  Has toenail fungus in the RIGHT great toenail; using OTC toenail fungus treatment; no other problems with feet  Diabetes; FSBS was 135 or 138 this morning; sugars have been wacko lately; ups and downs; highest in the last 2 weeks was 338; close to 200 a lot of times Prednisone treatment for her lungs and her RA  Some symptoms of UTI lately; urgency, no control; little burning around the urethra sometimes when urinating; off an on; trying cranberry juice; no fevers; urine has funky smell Sx going on fairly recently  Weight nadir was 79 pounds, up to 85 pounds 12.8 ounces today  MGUS, managed by Dr. Grayland Ormond; going every six months; reviewed labs with them today  Spots on the skin on left side of nose, left side forehead, left arm; saw dermatologist was told not cancer but they are getting bigger  Depression screen [facility] 2/9 11/27/2016 09/26/2016 02/13/2016 06/15/2015  Decreased Interest 0 0 0 0  Down, Depressed, Hopeless 0 0 1 1  PHQ - 2 Score 0 0 1 1   Relevant past medical, surgical, family and social history reviewed Past Medical History:  Diagnosis Date  . Diabetes mellitus without complication ([place])   . High cholesterol   . Monoclonal paraproteinemia 01/31/2016  . Pulmonary fibrosis ([place])   . RA (rheumatoid arthritis) ([place])   . Shingles   . Status post bariatric surgery 02/13/2016   Past Surgical History:  Procedure Laterality Date  . CARDIAC SURGERY    . CHOLECYSTECTOMY     . EXPLORATION POST OPERATIVE OPEN HEART    . GASTRIC BYPASS  2007   Family History  Problem Relation Age of Onset  . Heart disease Mother   . Stroke Mother   . Diabetes Father   . Diabetes Sister   . Hyperlipidemia Sister   . Heart disease Sister   . Cancer Maternal Aunt     female  . Diabetes Brother   . Hypertension Brother   . Hyperlipidemia Daughter   . Fibromyalgia Daughter   . Stroke Maternal Grandmother   . Arthritis Daughter   . COPD Neg Hx    Social History  Substance Use Topics  . Smoking status: Never Smoker  . Smokeless tobacco: Never Used  . Alcohol use No    Interim medical history since last visit reviewed. Allergies and medications reviewed  Review of Systems Per HPI unless specifically indicated above     Objective:    BP 120/64   Pulse 100   Temp 97.7 F (36.5 C) (Oral)   Resp 16   Wt 85 lb 12.8 oz (38.9 kg)   SpO2 95% Comment: 2L o2  BMI 16.76 kg/m   Wt Readings from Last 3 Encounters:  11/27/16 85 lb 12.8 oz (38.9 kg)  09/26/16 86 lb (39 kg)  09/20/16 95 lb (  43.1 kg)    Physical Exam  Constitutional: No distress (but appears weak, frail, ill).  Weak and frail, appears chronically ill, seated in wheelchair; very thin extremities  HENT:  Head: Normocephalic and atraumatic.  Cardiovascular: Regular rhythm.  Tachycardia present.   Borderline tachycardic rate  Pulmonary/Chest: Effort normal. No respiratory distress. She has decreased breath sounds.  Abdominal: Soft. She exhibits no distension.  Neurological: She is alert. She displays no tremor.  Skin: She is not diaphoretic. No pallor.  Slightly thickened toenail distally RIGHT great toenail; other toenails appear normal  Psychiatric: She has a normal mood and affect. Her mood appears not anxious. Her affect is not blunt. She does not exhibit a depressed mood.  did not appear hopeless or despondent   Diabetic Foot Form - Detailed   Diabetic Foot Exam - detailed Diabetic Foot exam  was performed with the following findings:  Yes 11/27/2016  1:13 PM  Visual Foot Exam completed.:  Yes  Are the toenails ingrown?:  No Normal Range of Motion:  Yes Pulse Foot Exam completed.:  Yes  Right Dorsalis Pedis:  Present Left Dorsalis Pedis:  Present  Sensory Foot Exam Completed.:  Yes Swelling:  No Semmes-Weinstein Monofilament Test R Site 1-Great Toe:  Pos L Site 1-Great Toe:  Pos  R Site 4:  Pos L Site 4:  Pos  R Site 5:  Pos L Site 5:  Pos        Results for orders placed or performed during the hospital encounter of 54/00/86  Basic metabolic panel  Result Value Ref Range   Sodium 140 135 - 145 mmol/L   Potassium 3.9 3.5 - 5.1 mmol/L   Chloride 105 101 - 111 mmol/L   CO2 26 22 - 32 mmol/L   Glucose, Bld 181 (H) 65 - 99 mg/dL   BUN 21 (H) 6 - 20 mg/dL   Creatinine, Ser 0.44 0.44 - 1.00 mg/dL   Calcium 9.1 8.9 - 10.3 mg/dL   GFR calc non Af Amer >60 >60 mL/min   GFR calc Af Amer >60 >60 mL/min   Anion gap 9 5 - 15  CBC  Result Value Ref Range   WBC 7.4 3.6 - 11.0 K/uL   RBC 3.58 (L) 3.80 - 5.20 MIL/uL   Hemoglobin 13.2 12.0 - 16.0 g/dL   HCT 36.9 35.0 - 47.0 %   MCV 103.2 (H) 80.0 - 100.0 fL   MCH 37.0 (H) 26.0 - 34.0 pg   MCHC 35.9 32.0 - 36.0 g/dL   RDW 14.2 11.5 - 14.5 %   Platelets 241 150 - 440 K/uL  Troponin I  Result Value Ref Range   Troponin I <0.03 <0.03 ng/mL  Urinalysis, Complete w Microscopic  Result Value Ref Range   Color, Urine YELLOW (A) YELLOW   APPearance CLEAR (A) CLEAR   Specific Gravity, Urine 1.027 1.005 - 1.030   pH 6.0 5.0 - 8.0   Glucose, UA 50 (A) NEGATIVE mg/dL   Hgb urine dipstick NEGATIVE NEGATIVE   Bilirubin Urine NEGATIVE NEGATIVE   Ketones, ur NEGATIVE NEGATIVE mg/dL   Protein, ur NEGATIVE NEGATIVE mg/dL   Nitrite NEGATIVE NEGATIVE   Leukocytes, UA TRACE (A) NEGATIVE   RBC / HPF 0-5 0 - 5 RBC/hpf   WBC, UA 6-30 0 - 5 WBC/hpf   Bacteria, UA RARE (A) NONE SEEN   Squamous Epithelial / LPF 0-5 (A) NONE SEEN   Mucous  PRESENT    Hyaline Casts, UA PRESENT  Glucose, capillary  Result Value Ref Range   Glucose-Capillary 154 (H) 65 - 99 mg/dL  Basic metabolic panel  Result Value Ref Range   Sodium 140 135 - 145 mmol/L   Potassium 3.4 (L) 3.5 - 5.1 mmol/L   Chloride 108 101 - 111 mmol/L   CO2 24 22 - 32 mmol/L   Glucose, Bld 230 (H) 65 - 99 mg/dL   BUN 18 6 - 20 mg/dL   Creatinine, Ser 0.58 0.44 - 1.00 mg/dL   Calcium 8.3 (L) 8.9 - 10.3 mg/dL   GFR calc non Af Amer >60 >60 mL/min   GFR calc Af Amer >60 >60 mL/min   Anion gap 8 5 - 15  CBC  Result Value Ref Range   WBC 8.6 3.6 - 11.0 K/uL   RBC 3.49 (L) 3.80 - 5.20 MIL/uL   Hemoglobin 12.8 12.0 - 16.0 g/dL   HCT 37.0 35.0 - 47.0 %   MCV 106.1 (H) 80.0 - 100.0 fL   MCH 36.7 (H) 26.0 - 34.0 pg   MCHC 34.6 32.0 - 36.0 g/dL   RDW 14.7 (H) 11.5 - 14.5 %   Platelets 200 150 - 440 K/uL  Glucose, capillary  Result Value Ref Range   Glucose-Capillary 323 (H) 65 - 99 mg/dL  Glucose, capillary  Result Value Ref Range   Glucose-Capillary 320 (H) 65 - 99 mg/dL   Comment 1 Notify RN   Glucose, capillary  Result Value Ref Range   Glucose-Capillary 98 65 - 99 mg/dL   Comment 1 Notify RN   Glucose, capillary  Result Value Ref Range   Glucose-Capillary 200 (H) 65 - 99 mg/dL  Glucose, capillary  Result Value Ref Range   Glucose-Capillary 152 (H) 65 - 99 mg/dL  Glucose, capillary  Result Value Ref Range   Glucose-Capillary 248 (H) 65 - 99 mg/dL   Comment 1 Notify RN   Glucose, capillary  Result Value Ref Range   Glucose-Capillary 137 (H) 65 - 99 mg/dL   Comment 1 Notify RN   Glucose, capillary  Result Value Ref Range   Glucose-Capillary 278 (H) 65 - 99 mg/dL  Glucose, capillary  Result Value Ref Range   Glucose-Capillary 154 (H) 65 - 99 mg/dL  Glucose, capillary  Result Value Ref Range   Glucose-Capillary 190 (H) 65 - 99 mg/dL      Assessment & Plan:   Problem List Items Addressed This Visit      Respiratory   Pulmonary fibrosis (HCC)  (Chronic)    Followed by Dr. Mortimer Fries; on round the clock O2        Endocrine   Type 2 diabetes mellitus without complication, without long-term current use of insulin (HCC) (Chronic)    Check A1c today; suspect urinary tract infection but will adjust medicine if normal urine; foot exam done by MD; eye exam UTD      Relevant Orders   TSH   Hemoglobin A1c   Diabetic neuropathy (HCC)    Intact to monofilament testing today; stable      Relevant Orders   Hemoglobin A1c     Musculoskeletal and Integument   Neoplasm of uncertain behavior of skin of nose    Refer to derm      Relevant Orders   Ambulatory referral to Dermatology     Other   Urinary urgency    Check urine today; may be causing fluctuating sugars      Relevant Orders   Urinalysis w microscopic + reflex  cultur   Protein-calorie malnutrition, severe    Check albumin      Relevant Orders   TSH   Medication monitoring encounter    Check labs      Relevant Orders   COMPLETE METABOLIC PANEL WITH GFR   High blood cholesterol (Chronic)    Check lipids      Relevant Orders   Lipid panel       Follow up plan: Return in about 3 months (around 02/26/2017) for follow-up and fasting labs.  An after-visit summary was printed and given to the patient at [place].  Please see the patient instructions which may contain other information and recommendations beyond what is mentioned above in the assessment and plan.  Meds ordered this encounter  Medications  . azaTHIOprine (IMURAN) 50 MG tablet    Sig: Take 50 mg by mouth daily.  . predniSONE (DELTASONE) 10 MG tablet    Sig: One by mouth daily    Orders Placed This Encounter  Procedures  . Urinalysis w microscopic + reflex cultur  . TSH  . Hemoglobin A1c  . COMPLETE METABOLIC PANEL WITH GFR  . Lipid panel  . Ambulatory referral to Dermatology

## 2016-11-27 NOTE — Assessment & Plan Note (Signed)
Check urine today; may be causing fluctuating sugars

## 2016-11-27 NOTE — Assessment & Plan Note (Signed)
Intact to monofilament testing today; stable

## 2016-11-27 NOTE — Assessment & Plan Note (Signed)
Check A1c today; suspect urinary tract infection but will adjust medicine if normal urine; foot exam done by MD; eye exam UTD

## 2016-11-27 NOTE — Assessment & Plan Note (Signed)
Check albumin

## 2016-11-27 NOTE — Patient Instructions (Addendum)
We'll check labs today Keep follow-up with your specialist We'll have you see the dermatologist If you have not heard anything from my staff in a week about any orders/referrals/studies from today, please contact us here to follow-up [phone]

## 2016-11-27 NOTE — Assessment & Plan Note (Signed)
Refer to derm  ?

## 2016-11-27 NOTE — Assessment & Plan Note (Signed)
Check lipids 

## 2016-11-28 ENCOUNTER — Other Ambulatory Visit: Payer: Self-pay

## 2016-11-28 ENCOUNTER — Other Ambulatory Visit
Admission: RE | Admit: 2016-11-28 | Discharge: 2016-11-28 | Disposition: A | Discharge status: Home / Self Care | Payer: Medicare HMO | Source: Ambulatory Visit | Attending: Family Medicine | Admitting: Family Medicine

## 2016-11-28 ENCOUNTER — Other Ambulatory Visit: Payer: Self-pay | Admitting: Family Medicine

## 2016-11-28 ENCOUNTER — Telehealth: Payer: Self-pay

## 2016-11-28 DIAGNOSIS — E875 Hyperkalemia: Secondary | ICD-10-CM | POA: Insufficient documentation

## 2016-11-28 LAB — URINALYSIS W MICROSCOPIC + REFLEX CULTURE
Bilirubin Urine: NEGATIVE
Casts: NONE SEEN [LPF]
Crystals: NONE SEEN [HPF]
GLUCOSE, UA: NEGATIVE
Hgb urine dipstick: NEGATIVE
Ketones, ur: NEGATIVE
NITRITE: NEGATIVE
Specific Gravity, Urine: 1.02 (ref 1.001–1.035)
WBC, UA: >60 WBC/HPF — AB (ref <=5)
YEAST: NONE SEEN [HPF]
pH: 6 (ref 5.0–8.0)

## 2016-11-28 LAB — POTASSIUM: Potassium: 3.7 mmol/L (ref 3.5–5.1)

## 2016-11-28 LAB — HEMOGLOBIN A1C
Hgb A1c MFr Bld: 6 % — ABNORMAL HIGH (ref <5.7)
Mean Plasma Glucose: 126 mg/dL

## 2016-11-28 MED ORDER — SULFAMETHOXAZOLE-TRIMETHOPRIM 800-160 MG PO TABS: 1.0000 | ORAL_TABLET | Freq: Two times a day (BID) | ORAL | 0 refills | Status: DC

## 2016-11-28 NOTE — Telephone Encounter (Signed)
Spoke pt daughter about her lab results.

## 2016-11-30 ENCOUNTER — Other Ambulatory Visit: Payer: Self-pay | Admitting: Family Medicine

## 2016-11-30 LAB — URINE CULTURE

## 2016-11-30 MED ORDER — SULFAMETHOXAZOLE-TRIMETHOPRIM 200-40 MG/5ML PO SUSP: 20.0000 mL | Freq: Two times a day (BID) | ORAL | 0 refills | Status: AC

## 2016-11-30 NOTE — Progress Notes (Signed)
rx sent

## 2016-12-10 DIAGNOSIS — Z951 Presence of aortocoronary bypass graft: Secondary | ICD-10-CM | POA: Insufficient documentation

## 2016-12-10 DIAGNOSIS — E782 Mixed hyperlipidemia: Secondary | ICD-10-CM | POA: Insufficient documentation

## 2016-12-10 NOTE — Progress Notes (Signed)
Cardiology Office Note  Date:  12/11/2016   ID:  Anita Atkins, DOB 10/04/1934, MRN [id number]  PCP:  Enid Derry, MD   Chief Complaint  Patient presents with  . other    EKG OD 55mo f/u. ED for SOB 1/20. Would like to discuss lab results. Pt states that she is still having sob, pain in right ribs. Reviewed meds with pt verbally.    HPI:  [name]  Is a very pleasant 82 year old woman with history of  pulmonary fibrosis, severe shortness of breath now on oxygen, followed by pulmonary Anorexia Diabetes, gastric bypass,  coronary artery disease with proximal LAD stenosis,  CABG with vein graft to the LAD per the patient,   who presents for routine follow-up of her coronary artery disease and sinus tachycardia   07/2015  hospital admission for pneumonia Dramatic weight loss   shingles at the end of May 2017   admission to the hospital 09/16/2016 discharge 09/18/2016 Admitted for shortness of breath, given nebulizers, steroids, antibiotics, started on oxygen At discharge was placed on diltiazem extended release 120 mg daily for rate control  on oxygen since 1/23rd  In follow-up today she is having orthostasis symptoms, test to sit back down until symptoms of dizziness recover. Daughter who presents with her today is concerned about her tachycardia, heart rate 130. She is trying to eat more, weight has stabilized but still very low Limited in what she can eat secondary to diabetes Currently not taking her cholesterol medication Activities very limited by her shortness of breath and orthostasis  EKG personally reviewed by myself on todays visit  shows normal sinustachycardia rate 131 bpm, no significant ST or T-wave changes  Other past medical history Cardiac surgery 08/2012 at [facility] in [place], [place].  Typical anginal symptoms included chest tightness radiating up to her jaw and left arm.   she was on diabetes medications in the past and she  stopped these.   PMH:   has a past medical history of Diabetes mellitus without complication ([place]); High cholesterol; Monoclonal paraproteinemia (01/31/2016); Pulmonary fibrosis ([place]); RA (rheumatoid arthritis) ([place]); Shingles; and Status post bariatric surgery (02/13/2016).  PSH:    Past Surgical History:  Procedure Laterality Date  . CARDIAC SURGERY    . CHOLECYSTECTOMY    . EXPLORATION POST OPERATIVE OPEN HEART    . GASTRIC BYPASS  2007    Current Outpatient Prescriptions  Medication Sig Dispense Refill  . acetaminophen (TYLENOL) 500 MG tablet Take 500 mg by mouth every 6 (six) hours as needed.     Marland Kitchen alendronate (FOSAMAX) 70 MG tablet Take 1 tablet by mouth once a week.    Marland Kitchen aspirin EC 81 MG tablet Take 81 mg by mouth daily.    Marland Kitchen azaTHIOprine (IMURAN) 50 MG tablet Take 50 mg by mouth daily.    Marland Kitchen azelastine (ASTELIN) 0.1 % nasal spray Place 1 spray into both nostrils 2 (two) times daily as needed.     . benzonatate (TESSALON) 100 MG capsule Take 1 capsule (100 mg total) by mouth 3 (three) times daily as needed for cough. 120 capsule 6  . Control Gel Formula Dressing (DUODERM CGF DRESSING) MISC Apply duoderm over sore every 3 days; remove and replace 5 each 0  . feeding supplement (BOOST / RESOURCE BREEZE) LIQD Take 1 Container by mouth 3 (three) times daily between meals. 237 mL 0  . glipiZIDE (GLUCOTROL) 5 MG tablet Take 1 tablet (5 mg total) by mouth 2 (two)  times daily before a meal. If needed for high sugars 60 tablet 0  . glucose blood (ACCU-CHEK AVIVA) test strip Use as instructed to check sugars once a day; E11.9, LON 99 months 100 each 1  . ipratropium-albuterol (DUONEB) 0.5-2.5 (3) MG/3ML SOLN Take 3 mLs by nebulization every 4 (four) hours as needed. 360 mL 10  . leflunomide (ARAVA) 20 MG tablet Take 20 mg by mouth daily.    . mirtazapine (REMERON) 30 MG tablet Take 1 tablet (30 mg total) by mouth at bedtime. 90 tablet 1  . OFEV 150 MG CAPS Take 150 mg by mouth daily.     Marland Kitchen  oxyCODONE-acetaminophen (ROXICET) 5-325 MG tablet Take 1 tablet by mouth every 4 (four) hours as needed. 30 tablet 0  . predniSONE (DELTASONE) 10 MG tablet One by mouth daily    . SYMBICORT 160-4.5 MCG/ACT inhaler Inhale 2 puffs into the lungs 2 (two) times daily. (Patient taking differently: Inhale 2 puffs into the lungs as needed. ) 1 Inhaler 5  . VENTOLIN HFA 108 (90 Base) MCG/ACT inhaler Inhale 2 puffs into the lungs every 6 (six) hours as needed. 1 Inhaler 2  . metoprolol tartrate (LOPRESSOR) 25 MG tablet Take 1 tablet (25 mg total) by mouth 2 (two) times daily. 180 tablet 3  . pantoprazole (PROTONIX) 20 MG tablet Take 1 tablet (20 mg total) by mouth daily. 90 tablet 8  . simvastatin (ZOCOR) 20 MG tablet Take 1 tablet (20 mg total) by mouth at bedtime. 90 tablet 3   No current facility-administered medications for this visit.      Allergies:   Plaquenil [hydroxychloroquine sulfate]   Social History:  The patient  reports that she has never smoked. She has never used smokeless tobacco. She reports that she does not drink alcohol or use drugs.   Family History:   family history includes Arthritis in her daughter; Cancer in her maternal aunt; Diabetes in her brother, father, and sister; Fibromyalgia in her daughter; Heart disease in her mother and sister; Hyperlipidemia in her daughter and sister; Hypertension in her brother; Stroke in her maternal grandmother and mother.    Review of Systems: Review of Systems  Constitutional: Positive for malaise/fatigue and weight loss.  Respiratory: Positive for shortness of breath.   Cardiovascular: Negative.   Gastrointestinal: Negative.   Musculoskeletal:       Unsteady gait  Neurological: Negative.   Psychiatric/Behavioral: Negative.   All other systems reviewed and are negative.    PHYSICAL EXAM: VS:  BP (!) 92/54 (BP Location: Right Arm, Patient Position: Sitting, Cuff Size: Normal)   Pulse (!) 131   Ht 5' (1.524 m)   Wt 85 lb 8 oz  (38.8 kg)   BMI 16.70 kg/m  , BMI Body mass index is 16.7 kg/m. GEN: Thin,  in no acute distress  HEENT: normal  Neck: no JVD, carotid bruits, or masses Cardiac: Regular rhythm, tachycardic; no murmurs, rubs, or gallops,no edema  Respiratory:  Diffuse crackles bilaterally appreciated anterior and posterior, mild increased work of breathing GI: soft, nontender, nondistended, + BS MS: no deformity or atrophy  Skin: warm and dry, no rash Neuro:  Strength and sensation are intact Psych: euthymic mood, full affect    Recent Labs: 09/16/2016: Hemoglobin 12.8; Platelets 200 11/27/2016: ALT 25; BUN 24; Creat 0.58; Sodium 142; TSH 2.54 11/28/2016: Potassium 3.7    Lipid Panel Lab Results  Component Value Date   CHOL 263 (H) 11/27/2016   HDL 69 11/27/2016  [place]man 150 (H) 11/27/2016   TRIG 221 (H) 11/27/2016      Wt Readings from Last 3 Encounters:  12/11/16 85 lb 8 oz (38.8 kg)  11/27/16 85 lb 12.8 oz (38.9 kg)  09/26/16 86 lb (39 kg)       ASSESSMENT AND PLAN:  Sinus tachycardia  Now with orthostasis likely exacerbated by diltiazem  Recommended she hold the Cardizem  We will start metoprolol 25 mg twice a day  We'll titrated upwards as blood pressure tolerates  Encouraged fluid hydration, increased nutrition  She is otherwise asymptomatic  I have recommended to the patient and her daughter that they call me in the next week or 2 with heart rate and blood pressure numbers for further medication adjustment  Coronary artery disease involving native coronary artery of native heart without angina pectoris - Plan: EKG 12-Lead Currently with no symptoms of angina She would like to restart simvastatin, will start 20 mg  Pulmonary fibrosis (HCC) Chronic shortness of breath, Slow steady progression, followed by pulmonary Now on oxygen  High blood cholesterol Simvastatin 20 mg daily  Type 2 diabetes mellitus without complication, without long-term current use of insulin  (HCC) Low body weight likely contributing to hypotension and tachycardia May need to increase her calorie intake, adjust her medications accordingly    Total encounter time more than 25 minutes  Greater than 50% was spent in counseling and coordination of care with the patient   Disposition:   F/U  3 months   Orders Placed This Encounter  Procedures  . EKG 12-Lead     Signed, Esmond Plants, M.D., Ph.D. 12/11/2016  [place], [place]

## 2016-12-11 ENCOUNTER — Ambulatory Visit (INDEPENDENT_AMBULATORY_CARE_PROVIDER_SITE_OTHER): Payer: Medicare HMO | Admitting: Cardiovascular Disease

## 2016-12-11 ENCOUNTER — Encounter: Payer: Self-pay | Admitting: Cardiovascular Disease

## 2016-12-11 VITALS — BP 92/54 | HR 131 | Ht 60.0 in | Wt 85.5 lb

## 2016-12-11 DIAGNOSIS — J8410 Pulmonary fibrosis, unspecified: Secondary | ICD-10-CM | POA: Diagnosis not present

## 2016-12-11 DIAGNOSIS — E782 Mixed hyperlipidemia: Secondary | ICD-10-CM | POA: Diagnosis not present

## 2016-12-11 DIAGNOSIS — I2510 Atherosclerotic heart disease of native coronary artery without angina pectoris: Secondary | ICD-10-CM

## 2016-12-11 DIAGNOSIS — E1142 Type 2 diabetes mellitus with diabetic polyneuropathy: Secondary | ICD-10-CM | POA: Diagnosis not present

## 2016-12-11 DIAGNOSIS — Z951 Presence of aortocoronary bypass graft: Secondary | ICD-10-CM | POA: Diagnosis not present

## 2016-12-11 DIAGNOSIS — R5383 Other fatigue: Secondary | ICD-10-CM | POA: Diagnosis not present

## 2016-12-11 DIAGNOSIS — E119 Type 2 diabetes mellitus without complications: Secondary | ICD-10-CM | POA: Diagnosis not present

## 2016-12-11 MED ORDER — SIMVASTATIN 20 MG PO TABS: 20.0000 mg | ORAL_TABLET | Freq: Every day | ORAL | 3 refills | Status: DC

## 2016-12-11 MED ORDER — METOPROLOL TARTRATE 25 MG PO TABS: 25.0000 mg | ORAL_TABLET | Freq: Two times a day (BID) | ORAL | 3 refills | Status: DC

## 2016-12-11 NOTE — Patient Instructions (Addendum)
Medication Instructions:    Please hold the diltiazem Start metoprolol one pill twice a day  Labwork:  No new labs needed  Testing/Procedures:  No further testing at this time   I recommend watching educational videos on topics of interest to you at:       [email or web address]  Enter code: [email or web address]    Follow-Up: It was a pleasure seeing you in the office today. Please call us if you have new issues that need to be addressed before your next appt.  [phone]  Your physician wants you to follow-up in: 3 months.  You will receive a reminder letter in the mail two months in advance. If you don't receive a letter, please call our office to schedule the follow-up appointment.  If you need a refill on your cardiac medications before your next appointment, please call your pharmacy.

## 2016-12-14 ENCOUNTER — Ambulatory Visit
Admission: EM | Admit: 2016-12-14 | Discharge: 2016-12-14 | Disposition: A | Discharge status: Home / Self Care | Payer: Medicare HMO | Attending: Family Medicine | Admitting: Family Medicine

## 2016-12-14 ENCOUNTER — Telehealth: Payer: Self-pay | Admitting: Family Medicine

## 2016-12-14 ENCOUNTER — Ambulatory Visit (INDEPENDENT_AMBULATORY_CARE_PROVIDER_SITE_OTHER): Payer: Medicare HMO

## 2016-12-14 ENCOUNTER — Encounter: Payer: Self-pay | Admitting: *Deleted

## 2016-12-14 DIAGNOSIS — S3992XA Unspecified injury of lower back, initial encounter: Secondary | ICD-10-CM | POA: Diagnosis not present

## 2016-12-14 DIAGNOSIS — S3210XA Unspecified fracture of sacrum, initial encounter for closed fracture: Secondary | ICD-10-CM

## 2016-12-14 DIAGNOSIS — M545 Low back pain: Secondary | ICD-10-CM

## 2016-12-14 DIAGNOSIS — M533 Sacrococcygeal disorders, not elsewhere classified: Secondary | ICD-10-CM | POA: Diagnosis not present

## 2016-12-14 MED ORDER — OXYCODONE-ACETAMINOPHEN 5-325 MG PO TABS: 1.0000 | ORAL_TABLET | Freq: Four times a day (QID) | ORAL | 0 refills | Status: DC | PRN

## 2016-12-14 NOTE — ED Triage Notes (Signed)
Pt lost balance and fell 2 nights ago landing on buttocks. Now c/o tailbone pain with right side rib pain. Denies dysnea or respiratory changes. Has hx of pulmonary fibrosis.

## 2016-12-14 NOTE — Telephone Encounter (Signed)
Dorian Pod, daughter of patient called on behalf of her mom stating mom fell 12/13/16 at 3 in the morning. Daughter states she has a lot pain mostly in her tail bone and her ribs are hurting as well. Shoulder was hurting when she first fell but is better today. Home nurse is on vacation until Monday and daughter was wanting to know what options she has as far as getting her mom checked out. Pt fell into curio cabinet but did not get any cuts but does have scrapes and she also hit the floor and possibly could have hit her head on a dresser. Daughter states it is very exhausting for her mom to take her out to the ER. Daughter is requesting a call back as soon as someone is available.

## 2016-12-14 NOTE — Telephone Encounter (Signed)
I spoke with daughter She was getting up to use bathroom, lost balance, fell into cabinet; no visible injuries Home health nurse said best to get checked out 122/72, pulse 99 112/70, pulse 86 110/57, pulse 79 I encouraged her to take her to urgent care May need orthostatics checked

## 2016-12-14 NOTE — ED Provider Notes (Signed)
[facility]    CSN: [id number] Arrival date & time: 12/14/16  1309     History   Chief Complaint Chief Complaint  Patient presents with  . Back Pain    HPI Anita Atkins is a 82 y.o. female.   82   The history is provided by the patient.  Back Pain  Location:  Lumbar spine Quality:  Aching Radiates to:  Does not radiate Pain severity:  Severe Pain is:  Same all the time Onset quality:  Sudden Duration:  2 days Timing:  Constant Progression:  Unchanged Context: falling (patient lost balance and fell at home landing on her tailbone)   Relieved by: pressure relief with donut cushion. Worsened by:  Sitting Associated symptoms: no abdominal pain, no abdominal swelling, no bladder incontinence, no bowel incontinence, no chest pain, no dysuria, no fever, no headaches, no leg pain, no numbness, no paresthesias, no pelvic pain, no perianal numbness, no tingling, no weakness and no weight loss   Risk factors: no recent surgery     Past Medical History:  Diagnosis Date  . Diabetes mellitus without complication ([place])   . High cholesterol   . Monoclonal paraproteinemia 01/31/2016  . Pulmonary fibrosis ([place])   . RA (rheumatoid arthritis) ([place])   . Shingles   . Status post bariatric surgery 02/13/2016    Patient Active Problem List   Diagnosis Date Noted  . Hx of CABG 12/10/2016  . Mixed hyperlipidemia 12/10/2016  . Urinary urgency 11/27/2016  . Medication monitoring encounter 11/27/2016  . Neoplasm of uncertain behavior of skin of nose 11/27/2016  . Decubitus ulcer of sacral region, stage 1 10/07/2016  . Pressure injury of skin 09/17/2016  . Dyspnea 09/16/2016  . Weakness 04/06/2016  . Protein-calorie malnutrition, severe 03/06/2016  . Status post bariatric surgery 02/13/2016  . MGUS (monoclonal gammopathy of unknown significance) 01/31/2016  . Elevated sedimentation rate 08/15/2015  . History of ITP 07/18/2015  . Abnormal SPEP 07/18/2015  . Diabetic  neuropathy ([place]) 07/13/2015  . Macrocytosis 07/13/2015  . Depression, major, single episode, mild ([place]) 07/13/2015  . Cataracts, bilateral 06/20/2015  . Hyperkalemia 06/17/2015  . Cough, persistent 06/15/2015  . Rheumatoid arthritis ([place]) 06/15/2015  . Osteoporosis 06/15/2015  . Pulmonary fibrosis ([place]) 06/15/2015  . Type 2 diabetes mellitus without complication, without long-term current use of insulin ([place]) 06/15/2015  . Coronary artery disease involving native coronary artery of native heart without angina pectoris 06/15/2015  . Vitamin D deficiency 06/15/2015  . High blood cholesterol 06/15/2015  . Fatigue 06/15/2015  . Bariatric surgery status 06/15/2015  . Needs flu shot 06/15/2015  . Need for prophylactic vaccination with Streptococcus pneumoniae (Pneumococcus) and Influenza vaccines 06/15/2015    Past Surgical History:  Procedure Laterality Date  . CARDIAC SURGERY    . CHOLECYSTECTOMY    . EXPLORATION POST OPERATIVE OPEN HEART    . GASTRIC BYPASS  2007    OB History    No data available       Home Medications    Prior to Admission medications   Medication Sig Start Date End Date Taking? Authorizing Provider  alendronate (FOSAMAX) 70 MG tablet Take 1 tablet by mouth once a week. 11/11/15  Yes Historical Provider, MD  aspirin EC 81 MG tablet Take 81 mg by mouth daily.   Yes Historical Provider, MD  azaTHIOprine (IMURAN) 50 MG tablet Take 50 mg by mouth daily.   Yes Historical Provider, MD  azelastine (ASTELIN) 0.1 % nasal spray Place 1 spray  into both nostrils 2 (two) times daily as needed.  03/16/15  Yes Historical Provider, MD  Control Gel Formula Dressing (DUODERM CGF DRESSING) MISC Apply duoderm over sore every 3 days; remove and replace 09/26/16  Yes Arnetha Courser, MD  feeding supplement (BOOST / RESOURCE BREEZE) LIQD Take 1 Container by mouth 3 (three) times daily between meals. 03/08/16  Yes Vaughan Basta, MD  glipiZIDE (GLUCOTROL) 5 MG tablet Take 1  tablet (5 mg total) by mouth 2 (two) times daily before a meal. If needed for high sugars 09/20/16  Yes Arnetha Courser, MD  glucose blood (ACCU-CHEK AVIVA) test strip Use as instructed to check sugars once a day; E11.9, LON 99 months 03/13/16  Yes Arnetha Courser, MD  leflunomide (ARAVA) 20 MG tablet Take 20 mg by mouth daily.   Yes Historical Provider, MD  metoprolol tartrate (LOPRESSOR) 25 MG tablet Take 1 tablet (25 mg total) by mouth 2 (two) times daily. 12/11/16 12/11/17 Yes Minna Merritts, MD  mirtazapine (REMERON) 30 MG tablet Take 1 tablet (30 mg total) by mouth at bedtime. 09/26/16  Yes Arnetha Courser, MD  OFEV 150 MG CAPS Take 150 mg by mouth daily.  07/25/15  Yes Historical Provider, MD  predniSONE (DELTASONE) 10 MG tablet One by mouth daily 11/27/16  Yes Arnetha Courser, MD  SYMBICORT 160-4.5 MCG/ACT inhaler Inhale 2 puffs into the lungs 2 (two) times daily. Patient taking differently: Inhale 2 puffs into the lungs as needed.  04/12/16  Yes Flora Lipps, MD  VENTOLIN HFA 108 (90 Base) MCG/ACT inhaler Inhale 2 puffs into the lungs every 6 (six) hours as needed. 04/12/16  Yes Flora Lipps, MD  acetaminophen (TYLENOL) 500 MG tablet Take 500 mg by mouth every 6 (six) hours as needed.     Historical Provider, MD  benzonatate (TESSALON) 100 MG capsule Take 1 capsule (100 mg total) by mouth 3 (three) times daily as needed for cough. 05/22/16   Flora Lipps, MD  ipratropium-albuterol (DUONEB) 0.5-2.5 (3) MG/3ML SOLN Take 3 mLs by nebulization every 4 (four) hours as needed. 05/22/16   Flora Lipps, MD  oxyCODONE-acetaminophen (PERCOCET/ROXICET) 5-325 MG tablet Take 1 tablet by mouth every 6 (six) hours as needed for severe pain. 12/14/16   Norval Gable, MD  pantoprazole (PROTONIX) 20 MG tablet Take 1 tablet (20 mg total) by mouth daily. 12/07/15 12/06/16  Flora Lipps, MD  simvastatin (ZOCOR) 20 MG tablet Take 1 tablet (20 mg total) by mouth at bedtime. 12/11/16 12/11/17  Minna Merritts, MD    Family  History Family History  Problem Relation Age of Onset  . Heart disease Mother   . Stroke Mother   . Diabetes Father   . Diabetes Sister   . Hyperlipidemia Sister   . Heart disease Sister   . Cancer Maternal Aunt     female  . Diabetes Brother   . Hypertension Brother   . Hyperlipidemia Daughter   . Fibromyalgia Daughter   . Stroke Maternal Grandmother   . Arthritis Daughter   . COPD Neg Hx     Social History Social History  Substance Use Topics  . Smoking status: Never Smoker  . Smokeless tobacco: Never Used  . Alcohol use No     Allergies   Plaquenil [hydroxychloroquine sulfate]   Review of Systems Review of Systems  Constitutional: Negative for fever and weight loss.  Cardiovascular: Negative for chest pain.  Gastrointestinal: Negative for abdominal pain and bowel incontinence.  Genitourinary: Negative  for bladder incontinence, dysuria and pelvic pain.  Musculoskeletal: Positive for back pain.  Neurological: Negative for tingling, weakness, numbness, headaches and paresthesias.     Physical Exam Triage Vital Signs ED Triage Vitals  Enc Vitals Group     BP 12/14/16 1407 (!) 97/52     Pulse Rate 12/14/16 1407 78     Resp 12/14/16 1407 16     Temp 12/14/16 1407 97.5 F (36.4 C)     Temp Source 12/14/16 1407 Oral     SpO2 12/14/16 1407 98 %     Weight 12/14/16 1410 85 lb (38.6 kg)     Height 12/14/16 1410 5' (1.524 m)     Head Circumference --      Peak Flow --      Pain Score --      Pain Loc --      Pain Edu? --      Excl. in [place]? --    No data found.   Updated Vital Signs BP (!) 97/52 (BP Location: Left Arm)   Pulse 78   Temp 97.5 F (36.4 C) (Oral)   Resp 16   Ht 5' (1.524 m)   Wt 85 lb (38.6 kg)   SpO2 98%   BMI 16.60 kg/m   Visual Acuity Right Eye Distance:   Left Eye Distance:   Bilateral Distance:    Right Eye Near:   Left Eye Near:    Bilateral Near:     Physical Exam  Constitutional: She appears well-developed and  well-nourished. No distress.  Musculoskeletal:       Lumbar back: She exhibits tenderness and bony tenderness (over sacrum). She exhibits normal range of motion, no swelling, no edema, no deformity, no laceration, no pain, no spasm and normal pulse.  Skin: She is not diaphoretic.  Nursing note and vitals reviewed.    UC Treatments / Results  Labs (all labs ordered are listed, but only abnormal results are displayed) Labs Reviewed - No data to display  EKG  EKG Interpretation None       Radiology Dg Sacrum/coccyx  Result Date: 12/14/2016 CLINICAL DATA:  Sacrococcygeal pain secondary to a fall early yesterday. EXAM: SACRUM AND COCCYX - 2+ VIEW COMPARISON:  None. FINDINGS: There is cortical irregularity consistent with a fracture of the distal sacrum seen only on the lateral view. Osteopenia. IMPRESSION: Distal sacral fracture. Electronically Signed   By: Lorriane Shire M.D.   On: 12/14/2016 16:09    Procedures Procedures (including critical care time)  Medications Ordered in UC Medications - No data to display   Initial Impression / Assessment and Plan / UC Course  I have reviewed the triage vital signs and the nursing notes.  Pertinent labs & imaging results that were available during my care of the patient were reviewed by me and considered in my medical decision making (see chart for details).       Final Clinical Impressions(s) / UC Diagnoses   Final diagnoses:  Closed fracture of sacrum, unspecified fracture morphology, initial encounter [facility])    New Prescriptions Discharge Medication List as of 12/14/2016  4:16 PM     1. x-ray results and diagnosis reviewed with patient 2. rx as per orders above; reviewed possible side effects, interactions, risks and benefits; rx for Roxicet given as per orders  3. Recommend supportive treatment with rest, ice, pressure relief 4. Follow-up prn if symptoms worsen or don't improve   Norval Gable, MD 12/14/16 [phone]

## 2016-12-14 NOTE — ED Notes (Signed)
Patient transported to X-ray 

## 2016-12-17 DIAGNOSIS — I2510 Atherosclerotic heart disease of native coronary artery without angina pectoris: Secondary | ICD-10-CM | POA: Diagnosis not present

## 2016-12-17 DIAGNOSIS — R531 Weakness: Secondary | ICD-10-CM | POA: Diagnosis not present

## 2016-12-17 DIAGNOSIS — L8990 Pressure ulcer of unspecified site, unspecified stage: Secondary | ICD-10-CM | POA: Diagnosis not present

## 2016-12-17 DIAGNOSIS — M069 Rheumatoid arthritis, unspecified: Secondary | ICD-10-CM | POA: Diagnosis not present

## 2016-12-17 DIAGNOSIS — R269 Unspecified abnormalities of gait and mobility: Secondary | ICD-10-CM | POA: Diagnosis not present

## 2016-12-19 ENCOUNTER — Telehealth: Payer: Self-pay | Admitting: Family Medicine

## 2016-12-19 DIAGNOSIS — N390 Urinary tract infection, site not specified: Secondary | ICD-10-CM

## 2016-12-19 NOTE — Telephone Encounter (Signed)
Left detailed voicemail, urine ordered

## 2016-12-19 NOTE — Telephone Encounter (Signed)
Please contact patient, time for recheck urine; from previous lab note: Notes recorded by Arnetha Courser, MD on 11/30/2016 at 1:02 PM EDT Please call patient; let her know that the antibiotic should have worked for this particular UTI; I'd like to recheck her urine in 2-3 weeks; please order repeat UA with micro and reflex culture; thank you -----------------------------------

## 2016-12-20 ENCOUNTER — Telehealth: Payer: Self-pay | Admitting: Internal Medicine

## 2016-12-20 ENCOUNTER — Ambulatory Visit: Payer: Commercial Managed Care - HMO | Admitting: Family Medicine

## 2016-12-20 NOTE — Telephone Encounter (Signed)
[facility] calling stating they received a order form for medication Just needs to get some clarification on what we sent them Please call back

## 2016-12-20 NOTE — Telephone Encounter (Signed)
Spoke with Anita Atkins and she needed clarification on the Ipratropium asking if it is Q4hrs or Q4-6 hours. Gave clarification. Nothing further needed.

## 2016-12-25 ENCOUNTER — Other Ambulatory Visit: Payer: Self-pay

## 2016-12-25 DIAGNOSIS — N390 Urinary tract infection, site not specified: Secondary | ICD-10-CM

## 2016-12-26 LAB — URINALYSIS W MICROSCOPIC + REFLEX CULTURE
BACTERIA UA: NONE SEEN [HPF]
Bilirubin Urine: NEGATIVE
Casts: NONE SEEN [LPF]
Glucose, UA: NEGATIVE
Hgb urine dipstick: NEGATIVE
Ketones, ur: NEGATIVE
Leukocytes, UA: NEGATIVE
Nitrite: NEGATIVE
Protein, ur: NEGATIVE
RBC / HPF: NONE SEEN RBC/HPF (ref <=2)
Specific Gravity, Urine: 1.016 (ref 1.001–1.035)
YEAST: NONE SEEN [HPF]
pH: 6.5 (ref 5.0–8.0)

## 2017-01-02 ENCOUNTER — Telehealth: Payer: Self-pay | Admitting: Family Medicine

## 2017-01-02 NOTE — Telephone Encounter (Signed)
That's fine. I'll be glad to sign that order. Thank you

## 2017-01-02 NOTE — Telephone Encounter (Signed)
Anita Atkins from [facility] of [place] is requesting hospice order they already see her for palliative of care services. She will also be faxing over the requested order as well (P) [phone] (F) [phone]

## 2017-01-08 ENCOUNTER — Ambulatory Visit (INDEPENDENT_AMBULATORY_CARE_PROVIDER_SITE_OTHER): Payer: Medicare HMO | Admitting: Internal Medicine

## 2017-01-08 ENCOUNTER — Encounter: Payer: Self-pay | Admitting: Internal Medicine

## 2017-01-08 VITALS — BP 130/70 | HR 108 | Ht 60.0 in | Wt 86.6 lb

## 2017-01-08 DIAGNOSIS — F329 Major depressive disorder, single episode, unspecified: Secondary | ICD-10-CM | POA: Diagnosis not present

## 2017-01-08 DIAGNOSIS — R634 Abnormal weight loss: Secondary | ICD-10-CM | POA: Diagnosis not present

## 2017-01-08 DIAGNOSIS — M069 Rheumatoid arthritis, unspecified: Secondary | ICD-10-CM | POA: Diagnosis not present

## 2017-01-08 DIAGNOSIS — J9611 Chronic respiratory failure with hypoxia: Secondary | ICD-10-CM | POA: Diagnosis not present

## 2017-01-08 DIAGNOSIS — E7800 Pure hypercholesterolemia, unspecified: Secondary | ICD-10-CM | POA: Diagnosis not present

## 2017-01-08 DIAGNOSIS — E559 Vitamin D deficiency, unspecified: Secondary | ICD-10-CM | POA: Diagnosis not present

## 2017-01-08 DIAGNOSIS — R64 Cachexia: Secondary | ICD-10-CM | POA: Diagnosis not present

## 2017-01-08 DIAGNOSIS — E1142 Type 2 diabetes mellitus with diabetic polyneuropathy: Secondary | ICD-10-CM | POA: Diagnosis not present

## 2017-01-08 DIAGNOSIS — M810 Age-related osteoporosis without current pathological fracture: Secondary | ICD-10-CM | POA: Diagnosis not present

## 2017-01-08 DIAGNOSIS — H25019 Cortical age-related cataract, unspecified eye: Secondary | ICD-10-CM | POA: Insufficient documentation

## 2017-01-08 DIAGNOSIS — Z9981 Dependence on supplemental oxygen: Secondary | ICD-10-CM | POA: Diagnosis not present

## 2017-01-08 DIAGNOSIS — J8410 Pulmonary fibrosis, unspecified: Secondary | ICD-10-CM | POA: Diagnosis not present

## 2017-01-08 DIAGNOSIS — Z681 Body mass index (BMI) 19 or less, adult: Secondary | ICD-10-CM | POA: Diagnosis not present

## 2017-01-08 DIAGNOSIS — D472 Monoclonal gammopathy: Secondary | ICD-10-CM | POA: Diagnosis not present

## 2017-01-08 DIAGNOSIS — E43 Unspecified severe protein-calorie malnutrition: Secondary | ICD-10-CM | POA: Diagnosis not present

## 2017-01-08 DIAGNOSIS — Z7982 Long term (current) use of aspirin: Secondary | ICD-10-CM | POA: Diagnosis not present

## 2017-01-08 DIAGNOSIS — Z9884 Bariatric surgery status: Secondary | ICD-10-CM | POA: Diagnosis not present

## 2017-01-08 DIAGNOSIS — I2510 Atherosclerotic heart disease of native coronary artery without angina pectoris: Secondary | ICD-10-CM | POA: Diagnosis not present

## 2017-01-08 DIAGNOSIS — R3915 Urgency of urination: Secondary | ICD-10-CM | POA: Diagnosis not present

## 2017-01-08 MED ORDER — BUDESONIDE 0.5 MG/2ML IN SUSP: 0.5000 mg | Freq: Two times a day (BID) | RESPIRATORY_TRACT | 5 refills | Status: AC

## 2017-01-08 MED ORDER — FORMOTEROL FUMARATE 20 MCG/2ML IN NEBU: 20.0000 ug | INHALATION_SOLUTION | Freq: Two times a day (BID) | RESPIRATORY_TRACT | 5 refills | Status: DC

## 2017-01-08 NOTE — Progress Notes (Signed)
[facility] Consultation      Date: 01/08/2017,   MRN# [id number] Vetra Shinall 02-Jul-1935 Code Status:  Hosp day:@LENGTHOFSTAYDAYS @ Referring MD: @ATDPROV @     PCP:      AdmissionWeight: 86 lb 9.6 oz (39.3 kg)                 CurrentWeight: 86 lb 9.6 oz (39.3 kg) Dorothee Napierkowski is a 82 y.o. old female seen in consultation for pulmonary fibrosis     CHIEF COMPLAINT:  Follow Chronic SOB and WOB, follow up pneumonia H/o pulmonary fibrosis    HISTORY OF PRESENT ILLNESS  81 yo white female follow up for for previous dx of pulm fibrosis apporx 10-15 years ago CT chest 05/04/16 shows extensive b/l interstitial opacities c/w fibrosis Feels very tired all the time very fatigued Her symptoms are SOB and DOE chronic, associated with weakness and palpitations with exertion, Patient risk factors includes heavy second hand smoke exposure,  Has re-started  OFEV 1 at 150 mg daily Also on prednisone 10 mg daily Patient has no fevers, chills at this time, no evidence of infection Patient sitting up and breathing comfortably, looks ill, cachectic  Was d/c home on oxygen therapy, now on 2 L [place]  Office Spiro FVC 0.99L 43%predicted  ONO 06/2015 negative ONO on 04/16/16 Negative     Overall prognosis is very poor, I had lengthy discussion about QOL and daily activities,  Patient getting Home health with Hospice  I have started discussion about DNR status  Overall, prognosis     Current Medication:  Current Outpatient Prescriptions:  .  acetaminophen (TYLENOL) 500 MG tablet, Take 500 mg by mouth every 6 (six) hours as needed. , Disp: , Rfl:  .  alendronate (FOSAMAX) 70 MG tablet, Take 1 tablet by mouth once a week., Disp: , Rfl:  .  aspirin EC 81 MG tablet, Take 81 mg by mouth daily., Disp: , Rfl:  .  azaTHIOprine (IMURAN) 50 MG tablet, Take 50 mg by mouth daily., Disp: , Rfl:  .  azelastine (ASTELIN) 0.1 % nasal spray, Place 1 spray into both nostrils 2 (two) times  daily as needed. , Disp: , Rfl:  .  benzonatate (TESSALON) 100 MG capsule, Take 1 capsule (100 mg total) by mouth 3 (three) times daily as needed for cough., Disp: 120 capsule, Rfl: 6 .  Control Gel Formula Dressing (DUODERM CGF DRESSING) MISC, Apply duoderm over sore every 3 days; remove and replace, Disp: 5 each, Rfl: 0 .  feeding supplement (BOOST / RESOURCE BREEZE) LIQD, Take 1 Container by mouth 3 (three) times daily between meals., Disp: 237 mL, Rfl: 0 .  glipiZIDE (GLUCOTROL) 5 MG tablet, Take 1 tablet (5 mg total) by mouth 2 (two) times daily before a meal. If needed for high sugars, Disp: 60 tablet, Rfl: 0 .  glucose blood (ACCU-CHEK AVIVA) test strip, Use as instructed to check sugars once a day; E11.9, LON 99 months, Disp: 100 each, Rfl: 1 .  ipratropium-albuterol (DUONEB) 0.5-2.5 (3) MG/3ML SOLN, Take 3 mLs by nebulization every 4 (four) hours as needed., Disp: 360 mL, Rfl: 10 .  leflunomide (ARAVA) 20 MG tablet, Take 20 mg by mouth daily., Disp: , Rfl:  .  metoprolol tartrate (LOPRESSOR) 25 MG tablet, Take 1 tablet (25 mg total) by mouth 2 (two) times daily., Disp: 180 tablet, Rfl: 3 .  mirtazapine (REMERON) 30 MG tablet, Take 1 tablet (30 mg total) by mouth at bedtime., Disp: 90 tablet, Rfl:  1 .  OFEV 150 MG CAPS, Take 150 mg by mouth daily. , Disp: , Rfl:  .  oxyCODONE-acetaminophen (PERCOCET/ROXICET) 5-325 MG tablet, Take 1 tablet by mouth every 6 (six) hours as needed for severe pain., Disp: 10 tablet, Rfl: 0 .  predniSONE (DELTASONE) 10 MG tablet, One by mouth daily, Disp: , Rfl:  .  simvastatin (ZOCOR) 20 MG tablet, Take 1 tablet (20 mg total) by mouth at bedtime., Disp: 90 tablet, Rfl: 3 .  SYMBICORT 160-4.5 MCG/ACT inhaler, Inhale 2 puffs into the lungs 2 (two) times daily. (Patient taking differently: Inhale 2 puffs into the lungs as needed. ), Disp: 1 Inhaler, Rfl: 5 .  VENTOLIN HFA 108 (90 Base) MCG/ACT inhaler, Inhale 2 puffs into the lungs every 6 (six) hours as needed.,  Disp: 1 Inhaler, Rfl: 2 .  pantoprazole (PROTONIX) 20 MG tablet, Take 1 tablet (20 mg total) by mouth daily., Disp: 90 tablet, Rfl: 8    ALLERGIES   Plaquenil [hydroxychloroquine sulfate]     REVIEW OF SYSTEMS   Review of Systems  Constitutional: Negative for chills, fever, malaise/fatigue and weight loss.  HENT: Negative for congestion.   Eyes: Negative for blurred vision and double vision.  Respiratory: Positive for shortness of breath. Negative for cough.   Cardiovascular: Negative for chest pain, palpitations and leg swelling.  Gastrointestinal: Negative for heartburn.  Genitourinary: Negative.   Skin: Negative for rash.  Neurological: Negative for dizziness, weakness and headaches.  All other systems reviewed and are negative.    VS:BP 130/70 (BP Location: Right Arm, Cuff Size: Normal)   Pulse (!) 108   Ht 5' (1.524 m)   Wt 86 lb 9.6 oz (39.3 kg)   SpO2 95%   BMI 16.91 kg/m     PHYSICAL EXAM  Physical Exam  Constitutional: She is oriented to person, place, and time. No distress.  Thin and cachectic  Eyes: EOM are normal.  Cardiovascular: Normal rate, regular rhythm and normal heart sounds.   No murmur heard. Pulmonary/Chest: No stridor. No respiratory distress. She has no wheezes. She has rales.  Musculoskeletal: Normal range of motion. She exhibits no edema.  Neurological: She is alert and oriented to person, place, and time.  Skin: She is not diaphoretic.  Psychiatric: She has a normal mood and affect.         CXR on 03/12/16 Images reviewed RLL opacity seen   CXR 8/7 Images Previous RLL opacity has resolved  CT chest 05/04/16 extensive b/l honeycomb pattern Images reviewed  no lung masses       ASSESSMENT/PLAN   82 yo very pleasant white female with Pulmonary Fibrosis(no biopsy) for many years with chronic SOB and DOE with chronic cough  Very poor prognosis and very poor resp status  1.START Pulmicort and Peformist NEBS BID albuterol as  needed 2.contiue PPI 3..continue cough suppressant as needed with tessolon perles 4..follow up with cardiology as needed with dr Rockey Situ as needed 5.follow up GI consults for h/o gastric bypass as needed 6.continue OFEV 150 mg daily and prednisone 10 mg daily as prescribed 7. Combivent Nebs every 4 hrs as needed for excessive bronchospasms and coughing spells 8.pain meds as needed 9.continue HH with hospice    End of life goals of care discussed, patient is aware of poor resp status, daughters at visit Patient understands that she needs help and family is supportive.     Patient satisfied with Plan of action and management. All questions answered  Corrin Parker, M.D.  [facility]  Medical Director [place] Director [facility] Cardio-Pulmonary Department

## 2017-01-08 NOTE — Patient Instructions (Signed)
START PULMICORT 0.5 Twcie daily  Reformist NEB therapy twice daily Oxygen as needed

## 2017-01-10 DIAGNOSIS — L821 Other seborrheic keratosis: Secondary | ICD-10-CM | POA: Diagnosis not present

## 2017-01-10 DIAGNOSIS — R634 Abnormal weight loss: Secondary | ICD-10-CM | POA: Diagnosis not present

## 2017-01-10 DIAGNOSIS — E43 Unspecified severe protein-calorie malnutrition: Secondary | ICD-10-CM | POA: Diagnosis not present

## 2017-01-10 DIAGNOSIS — D485 Neoplasm of uncertain behavior of skin: Secondary | ICD-10-CM | POA: Diagnosis not present

## 2017-01-10 DIAGNOSIS — J8410 Pulmonary fibrosis, unspecified: Secondary | ICD-10-CM | POA: Diagnosis not present

## 2017-01-10 DIAGNOSIS — R64 Cachexia: Secondary | ICD-10-CM | POA: Diagnosis not present

## 2017-01-10 DIAGNOSIS — C44329 Squamous cell carcinoma of skin of other parts of face: Secondary | ICD-10-CM | POA: Diagnosis not present

## 2017-01-10 DIAGNOSIS — J9611 Chronic respiratory failure with hypoxia: Secondary | ICD-10-CM | POA: Diagnosis not present

## 2017-01-10 DIAGNOSIS — E1142 Type 2 diabetes mellitus with diabetic polyneuropathy: Secondary | ICD-10-CM | POA: Diagnosis not present

## 2017-01-10 DIAGNOSIS — D0412 Carcinoma in situ of skin of left eyelid, including canthus: Secondary | ICD-10-CM | POA: Diagnosis not present

## 2017-01-10 DIAGNOSIS — C44629 Squamous cell carcinoma of skin of left upper limb, including shoulder: Secondary | ICD-10-CM | POA: Diagnosis not present

## 2017-01-11 DIAGNOSIS — J9611 Chronic respiratory failure with hypoxia: Secondary | ICD-10-CM | POA: Diagnosis not present

## 2017-01-11 DIAGNOSIS — E43 Unspecified severe protein-calorie malnutrition: Secondary | ICD-10-CM | POA: Diagnosis not present

## 2017-01-11 DIAGNOSIS — R64 Cachexia: Secondary | ICD-10-CM | POA: Diagnosis not present

## 2017-01-11 DIAGNOSIS — E1142 Type 2 diabetes mellitus with diabetic polyneuropathy: Secondary | ICD-10-CM | POA: Diagnosis not present

## 2017-01-11 DIAGNOSIS — J8410 Pulmonary fibrosis, unspecified: Secondary | ICD-10-CM | POA: Diagnosis not present

## 2017-01-11 DIAGNOSIS — R634 Abnormal weight loss: Secondary | ICD-10-CM | POA: Diagnosis not present

## 2017-01-15 ENCOUNTER — Other Ambulatory Visit: Payer: Self-pay | Admitting: Internal Medicine

## 2017-01-15 DIAGNOSIS — K219 Gastro-esophageal reflux disease without esophagitis: Secondary | ICD-10-CM

## 2017-01-17 DIAGNOSIS — J8410 Pulmonary fibrosis, unspecified: Secondary | ICD-10-CM | POA: Diagnosis not present

## 2017-01-17 DIAGNOSIS — E1142 Type 2 diabetes mellitus with diabetic polyneuropathy: Secondary | ICD-10-CM | POA: Diagnosis not present

## 2017-01-17 DIAGNOSIS — R634 Abnormal weight loss: Secondary | ICD-10-CM | POA: Diagnosis not present

## 2017-01-17 DIAGNOSIS — J9611 Chronic respiratory failure with hypoxia: Secondary | ICD-10-CM | POA: Diagnosis not present

## 2017-01-17 DIAGNOSIS — E43 Unspecified severe protein-calorie malnutrition: Secondary | ICD-10-CM | POA: Diagnosis not present

## 2017-01-17 DIAGNOSIS — R64 Cachexia: Secondary | ICD-10-CM | POA: Diagnosis not present

## 2017-01-23 ENCOUNTER — Other Ambulatory Visit: Payer: Self-pay | Admitting: Family Medicine

## 2017-01-23 DIAGNOSIS — E1142 Type 2 diabetes mellitus with diabetic polyneuropathy: Secondary | ICD-10-CM | POA: Diagnosis not present

## 2017-01-23 DIAGNOSIS — R634 Abnormal weight loss: Secondary | ICD-10-CM | POA: Diagnosis not present

## 2017-01-23 DIAGNOSIS — E43 Unspecified severe protein-calorie malnutrition: Secondary | ICD-10-CM | POA: Diagnosis not present

## 2017-01-23 DIAGNOSIS — J8410 Pulmonary fibrosis, unspecified: Secondary | ICD-10-CM | POA: Diagnosis not present

## 2017-01-23 DIAGNOSIS — J9611 Chronic respiratory failure with hypoxia: Secondary | ICD-10-CM | POA: Diagnosis not present

## 2017-01-23 DIAGNOSIS — R64 Cachexia: Secondary | ICD-10-CM | POA: Diagnosis not present

## 2017-01-24 DIAGNOSIS — E43 Unspecified severe protein-calorie malnutrition: Secondary | ICD-10-CM | POA: Diagnosis not present

## 2017-01-24 DIAGNOSIS — J8410 Pulmonary fibrosis, unspecified: Secondary | ICD-10-CM | POA: Diagnosis not present

## 2017-01-24 DIAGNOSIS — E1142 Type 2 diabetes mellitus with diabetic polyneuropathy: Secondary | ICD-10-CM | POA: Diagnosis not present

## 2017-01-24 DIAGNOSIS — J9611 Chronic respiratory failure with hypoxia: Secondary | ICD-10-CM | POA: Diagnosis not present

## 2017-01-24 DIAGNOSIS — R64 Cachexia: Secondary | ICD-10-CM | POA: Diagnosis not present

## 2017-01-24 DIAGNOSIS — R634 Abnormal weight loss: Secondary | ICD-10-CM | POA: Diagnosis not present

## 2017-01-25 DIAGNOSIS — E43 Unspecified severe protein-calorie malnutrition: Secondary | ICD-10-CM | POA: Diagnosis not present

## 2017-01-25 DIAGNOSIS — R64 Cachexia: Secondary | ICD-10-CM | POA: Diagnosis not present

## 2017-01-25 DIAGNOSIS — R3915 Urgency of urination: Secondary | ICD-10-CM | POA: Diagnosis not present

## 2017-01-25 DIAGNOSIS — Z681 Body mass index (BMI) 19 or less, adult: Secondary | ICD-10-CM | POA: Diagnosis not present

## 2017-01-25 DIAGNOSIS — Z9981 Dependence on supplemental oxygen: Secondary | ICD-10-CM | POA: Diagnosis not present

## 2017-01-25 DIAGNOSIS — M810 Age-related osteoporosis without current pathological fracture: Secondary | ICD-10-CM | POA: Diagnosis not present

## 2017-01-25 DIAGNOSIS — E7800 Pure hypercholesterolemia, unspecified: Secondary | ICD-10-CM | POA: Diagnosis not present

## 2017-01-25 DIAGNOSIS — F329 Major depressive disorder, single episode, unspecified: Secondary | ICD-10-CM | POA: Diagnosis not present

## 2017-01-25 DIAGNOSIS — D472 Monoclonal gammopathy: Secondary | ICD-10-CM | POA: Diagnosis not present

## 2017-01-25 DIAGNOSIS — Z9884 Bariatric surgery status: Secondary | ICD-10-CM | POA: Diagnosis not present

## 2017-01-25 DIAGNOSIS — J8410 Pulmonary fibrosis, unspecified: Secondary | ICD-10-CM | POA: Diagnosis not present

## 2017-01-25 DIAGNOSIS — E559 Vitamin D deficiency, unspecified: Secondary | ICD-10-CM | POA: Diagnosis not present

## 2017-01-25 DIAGNOSIS — M069 Rheumatoid arthritis, unspecified: Secondary | ICD-10-CM | POA: Diagnosis not present

## 2017-01-25 DIAGNOSIS — E1142 Type 2 diabetes mellitus with diabetic polyneuropathy: Secondary | ICD-10-CM | POA: Diagnosis not present

## 2017-01-25 DIAGNOSIS — Z7982 Long term (current) use of aspirin: Secondary | ICD-10-CM | POA: Diagnosis not present

## 2017-01-25 DIAGNOSIS — I2510 Atherosclerotic heart disease of native coronary artery without angina pectoris: Secondary | ICD-10-CM | POA: Diagnosis not present

## 2017-01-25 DIAGNOSIS — R634 Abnormal weight loss: Secondary | ICD-10-CM | POA: Diagnosis not present

## 2017-01-25 DIAGNOSIS — J9611 Chronic respiratory failure with hypoxia: Secondary | ICD-10-CM | POA: Diagnosis not present

## 2017-01-28 ENCOUNTER — Telehealth: Payer: Self-pay

## 2017-01-28 DIAGNOSIS — R634 Abnormal weight loss: Secondary | ICD-10-CM | POA: Diagnosis not present

## 2017-01-28 DIAGNOSIS — E43 Unspecified severe protein-calorie malnutrition: Secondary | ICD-10-CM | POA: Diagnosis not present

## 2017-01-28 DIAGNOSIS — R64 Cachexia: Secondary | ICD-10-CM | POA: Diagnosis not present

## 2017-01-28 DIAGNOSIS — J9611 Chronic respiratory failure with hypoxia: Secondary | ICD-10-CM | POA: Diagnosis not present

## 2017-01-28 DIAGNOSIS — E1142 Type 2 diabetes mellitus with diabetic polyneuropathy: Secondary | ICD-10-CM | POA: Diagnosis not present

## 2017-01-28 DIAGNOSIS — J8410 Pulmonary fibrosis, unspecified: Secondary | ICD-10-CM | POA: Diagnosis not present

## 2017-01-28 NOTE — Telephone Encounter (Signed)
Mitz, Is with [facility] She states the pt might have a UTI. Pt is in server pain flank pain. She was on the way to the facility  therefore she was not sure which side. Pt does not have frequent urination, No burning, and no abdominal pain. Mitz states she will put a order in for a UA with [facility]. She asked me to look out for the order and give her a call with the results.

## 2017-01-29 ENCOUNTER — Telehealth: Payer: Self-pay

## 2017-01-29 ENCOUNTER — Other Ambulatory Visit: Payer: Self-pay | Admitting: Family Medicine

## 2017-01-29 NOTE — Telephone Encounter (Signed)
Update: Mitz faxed me ober the lab results. We both agreed pt results look clear no UTI. She states pt pain changed little once she got there and that she thinks it's more muscle pain since the rests look clear. She ill faxed over the more lab either wednesday or Thursday.

## 2017-01-30 ENCOUNTER — Other Ambulatory Visit: Payer: Self-pay | Admitting: Family Medicine

## 2017-01-30 NOTE — Telephone Encounter (Signed)
Rx for mirtazipine not due until late July 2018

## 2017-01-31 ENCOUNTER — Telehealth: Payer: Self-pay

## 2017-01-31 ENCOUNTER — Telehealth: Payer: Self-pay | Admitting: Family Medicine

## 2017-01-31 DIAGNOSIS — E43 Unspecified severe protein-calorie malnutrition: Secondary | ICD-10-CM | POA: Diagnosis not present

## 2017-01-31 DIAGNOSIS — J9611 Chronic respiratory failure with hypoxia: Secondary | ICD-10-CM | POA: Diagnosis not present

## 2017-01-31 DIAGNOSIS — R64 Cachexia: Secondary | ICD-10-CM | POA: Diagnosis not present

## 2017-01-31 DIAGNOSIS — R634 Abnormal weight loss: Secondary | ICD-10-CM | POA: Diagnosis not present

## 2017-01-31 DIAGNOSIS — J8410 Pulmonary fibrosis, unspecified: Secondary | ICD-10-CM | POA: Diagnosis not present

## 2017-01-31 DIAGNOSIS — E1142 Type 2 diabetes mellitus with diabetic polyneuropathy: Secondary | ICD-10-CM | POA: Diagnosis not present

## 2017-01-31 MED ORDER — MIRTAZAPINE 30 MG PO TABS: 30.0000 mg | ORAL_TABLET | Freq: Every day | ORAL | 1 refills | Status: DC

## 2017-01-31 MED ORDER — DOXYCYCLINE HYCLATE 100 MG PO TABS: 100.0000 mg | ORAL_TABLET | Freq: Two times a day (BID) | ORAL | 0 refills | Status: AC

## 2017-01-31 NOTE — Telephone Encounter (Signed)
Urine culture reviewed; >100k cfu/ml, not one dominant organism Lower back pain; siimilar to previous UTI Last antibiotic bactrim in April Will treat with doxy x 7 days Contact on-call over weekend if needed since not acting like typical UTI Mitzi agrees

## 2017-01-31 NOTE — Telephone Encounter (Signed)
Mizie Is concern about the pt Urine Culture. She asking  Dr.lada to look over the results; Elio Forget number is [phone].

## 2017-01-31 NOTE — Telephone Encounter (Signed)
Spoke with [facility] Rep. They send out refill request every 60 days so it can take time to process because it can take up to a month depending if they are back up of not. When the pt actually run out  medicine it will be ready and they can just send it out to them instead of them waiting.

## 2017-01-31 NOTE — Telephone Encounter (Signed)
Please get Mitzi with home health to the phone so I can discuss the patient's urine results; thank you

## 2017-02-04 ENCOUNTER — Encounter: Payer: Self-pay | Admitting: Family Medicine

## 2017-02-05 DIAGNOSIS — D485 Neoplasm of uncertain behavior of skin: Secondary | ICD-10-CM | POA: Diagnosis not present

## 2017-02-05 DIAGNOSIS — C44629 Squamous cell carcinoma of skin of left upper limb, including shoulder: Secondary | ICD-10-CM | POA: Diagnosis not present

## 2017-02-05 DIAGNOSIS — D0439 Carcinoma in situ of skin of other parts of face: Secondary | ICD-10-CM | POA: Diagnosis not present

## 2017-02-05 DIAGNOSIS — C44622 Squamous cell carcinoma of skin of right upper limb, including shoulder: Secondary | ICD-10-CM | POA: Diagnosis not present

## 2017-02-07 DIAGNOSIS — R634 Abnormal weight loss: Secondary | ICD-10-CM | POA: Diagnosis not present

## 2017-02-07 DIAGNOSIS — R64 Cachexia: Secondary | ICD-10-CM | POA: Diagnosis not present

## 2017-02-07 DIAGNOSIS — J9611 Chronic respiratory failure with hypoxia: Secondary | ICD-10-CM | POA: Diagnosis not present

## 2017-02-07 DIAGNOSIS — E1142 Type 2 diabetes mellitus with diabetic polyneuropathy: Secondary | ICD-10-CM | POA: Diagnosis not present

## 2017-02-07 DIAGNOSIS — E43 Unspecified severe protein-calorie malnutrition: Secondary | ICD-10-CM | POA: Diagnosis not present

## 2017-02-07 DIAGNOSIS — J8410 Pulmonary fibrosis, unspecified: Secondary | ICD-10-CM | POA: Diagnosis not present

## 2017-02-14 ENCOUNTER — Other Ambulatory Visit: Payer: Commercial Managed Care - HMO

## 2017-02-14 DIAGNOSIS — E43 Unspecified severe protein-calorie malnutrition: Secondary | ICD-10-CM | POA: Diagnosis not present

## 2017-02-14 DIAGNOSIS — J9611 Chronic respiratory failure with hypoxia: Secondary | ICD-10-CM | POA: Diagnosis not present

## 2017-02-14 DIAGNOSIS — E1142 Type 2 diabetes mellitus with diabetic polyneuropathy: Secondary | ICD-10-CM | POA: Diagnosis not present

## 2017-02-14 DIAGNOSIS — R634 Abnormal weight loss: Secondary | ICD-10-CM | POA: Diagnosis not present

## 2017-02-14 DIAGNOSIS — J8410 Pulmonary fibrosis, unspecified: Secondary | ICD-10-CM | POA: Diagnosis not present

## 2017-02-14 DIAGNOSIS — R64 Cachexia: Secondary | ICD-10-CM | POA: Diagnosis not present

## 2017-02-15 ENCOUNTER — Telehealth: Payer: Self-pay | Admitting: Family Medicine

## 2017-02-15 DIAGNOSIS — J8410 Pulmonary fibrosis, unspecified: Secondary | ICD-10-CM | POA: Diagnosis not present

## 2017-02-15 DIAGNOSIS — R64 Cachexia: Secondary | ICD-10-CM | POA: Diagnosis not present

## 2017-02-15 DIAGNOSIS — R634 Abnormal weight loss: Secondary | ICD-10-CM | POA: Diagnosis not present

## 2017-02-15 DIAGNOSIS — J9611 Chronic respiratory failure with hypoxia: Secondary | ICD-10-CM | POA: Diagnosis not present

## 2017-02-15 DIAGNOSIS — E43 Unspecified severe protein-calorie malnutrition: Secondary | ICD-10-CM | POA: Diagnosis not present

## 2017-02-15 DIAGNOSIS — E1142 Type 2 diabetes mellitus with diabetic polyneuropathy: Secondary | ICD-10-CM | POA: Diagnosis not present

## 2017-02-15 NOTE — Telephone Encounter (Signed)
Anita Atkins has been informed.  Anita Atkins also stated the for a week the pt has been really Fatigue. It takes every bit of her energy to talk. Pt also experiencing sob. Anita Atkins asking for something to help with pt Fatigue.

## 2017-02-15 NOTE — Telephone Encounter (Signed)
Anita Atkins from [facility] would like to know if it is okay (a verbal) for patient to continue with prednisone 10mg  daily. A different dr name was on it (Dr Annalee Genta) but you are the attending so Anita Atkins has to go through you [phone]

## 2017-02-15 NOTE — Telephone Encounter (Signed)
I'll suggest that they talk to Dr. Mortimer Fries about her difficulty breathing; she may need respiratory meds adjusted or oxygen adjusted She can take vitamin B12 sublingual 250 or 500 mcg daily

## 2017-02-15 NOTE — Telephone Encounter (Signed)
That's fine I reviewed Dr. Vernie Shanks notes and found 10 mg prednisone daily in April Approved for 10 mg prednisone by mouth once a day

## 2017-02-15 NOTE — Telephone Encounter (Signed)
[facility] has been notified

## 2017-02-21 ENCOUNTER — Inpatient Hospital Stay: Admitting: Oncology

## 2017-02-21 ENCOUNTER — Telehealth: Payer: Self-pay

## 2017-02-21 NOTE — Telephone Encounter (Signed)
Pt daughter asking does her mom have to come to the ppt that's scheduled 02/26/2017. Anita Atkins is on Pt DPR. Mention to [place] that Dr. lada is on vacation and will not be here intill her appt. I suggest for her to come to the ppt since pt is diabetic and and had some issue with UTI in the last month or so.  Daughter agreed.

## 2017-02-22 DIAGNOSIS — R634 Abnormal weight loss: Secondary | ICD-10-CM | POA: Diagnosis not present

## 2017-02-22 DIAGNOSIS — R64 Cachexia: Secondary | ICD-10-CM | POA: Diagnosis not present

## 2017-02-22 DIAGNOSIS — E1142 Type 2 diabetes mellitus with diabetic polyneuropathy: Secondary | ICD-10-CM | POA: Diagnosis not present

## 2017-02-22 DIAGNOSIS — J9611 Chronic respiratory failure with hypoxia: Secondary | ICD-10-CM | POA: Diagnosis not present

## 2017-02-22 DIAGNOSIS — J8410 Pulmonary fibrosis, unspecified: Secondary | ICD-10-CM | POA: Diagnosis not present

## 2017-02-22 DIAGNOSIS — E43 Unspecified severe protein-calorie malnutrition: Secondary | ICD-10-CM | POA: Diagnosis not present

## 2017-02-24 DIAGNOSIS — Z9981 Dependence on supplemental oxygen: Secondary | ICD-10-CM | POA: Diagnosis not present

## 2017-02-24 DIAGNOSIS — R3915 Urgency of urination: Secondary | ICD-10-CM | POA: Diagnosis not present

## 2017-02-24 DIAGNOSIS — R634 Abnormal weight loss: Secondary | ICD-10-CM | POA: Diagnosis not present

## 2017-02-24 DIAGNOSIS — M069 Rheumatoid arthritis, unspecified: Secondary | ICD-10-CM | POA: Diagnosis not present

## 2017-02-24 DIAGNOSIS — Z681 Body mass index (BMI) 19 or less, adult: Secondary | ICD-10-CM | POA: Diagnosis not present

## 2017-02-24 DIAGNOSIS — D472 Monoclonal gammopathy: Secondary | ICD-10-CM | POA: Diagnosis not present

## 2017-02-24 DIAGNOSIS — M810 Age-related osteoporosis without current pathological fracture: Secondary | ICD-10-CM | POA: Diagnosis not present

## 2017-02-24 DIAGNOSIS — R64 Cachexia: Secondary | ICD-10-CM | POA: Diagnosis not present

## 2017-02-24 DIAGNOSIS — F329 Major depressive disorder, single episode, unspecified: Secondary | ICD-10-CM | POA: Diagnosis not present

## 2017-02-24 DIAGNOSIS — E7800 Pure hypercholesterolemia, unspecified: Secondary | ICD-10-CM | POA: Diagnosis not present

## 2017-02-24 DIAGNOSIS — I2510 Atherosclerotic heart disease of native coronary artery without angina pectoris: Secondary | ICD-10-CM | POA: Diagnosis not present

## 2017-02-24 DIAGNOSIS — Z7982 Long term (current) use of aspirin: Secondary | ICD-10-CM | POA: Diagnosis not present

## 2017-02-24 DIAGNOSIS — E1142 Type 2 diabetes mellitus with diabetic polyneuropathy: Secondary | ICD-10-CM | POA: Diagnosis not present

## 2017-02-24 DIAGNOSIS — E43 Unspecified severe protein-calorie malnutrition: Secondary | ICD-10-CM | POA: Diagnosis not present

## 2017-02-24 DIAGNOSIS — J9611 Chronic respiratory failure with hypoxia: Secondary | ICD-10-CM | POA: Diagnosis not present

## 2017-02-24 DIAGNOSIS — J8410 Pulmonary fibrosis, unspecified: Secondary | ICD-10-CM | POA: Diagnosis not present

## 2017-02-24 DIAGNOSIS — Z9884 Bariatric surgery status: Secondary | ICD-10-CM | POA: Diagnosis not present

## 2017-02-24 DIAGNOSIS — E559 Vitamin D deficiency, unspecified: Secondary | ICD-10-CM | POA: Diagnosis not present

## 2017-02-26 ENCOUNTER — Ambulatory Visit (INDEPENDENT_AMBULATORY_CARE_PROVIDER_SITE_OTHER): Admitting: Family Medicine

## 2017-02-26 ENCOUNTER — Encounter: Payer: Self-pay | Admitting: Family Medicine

## 2017-02-26 DIAGNOSIS — K219 Gastro-esophageal reflux disease without esophagitis: Secondary | ICD-10-CM

## 2017-02-26 DIAGNOSIS — E119 Type 2 diabetes mellitus without complications: Secondary | ICD-10-CM

## 2017-02-26 DIAGNOSIS — J8410 Pulmonary fibrosis, unspecified: Secondary | ICD-10-CM | POA: Diagnosis not present

## 2017-02-26 MED ORDER — PANTOPRAZOLE SODIUM 20 MG PO TBEC: 20.0000 mg | DELAYED_RELEASE_TABLET | Freq: Two times a day (BID) | ORAL | 0 refills | Status: DC

## 2017-02-26 MED ORDER — SUCRALFATE 1 GM/10ML PO SUSP: 1.0000 g | Freq: Three times a day (TID) | ORAL | 0 refills | Status: AC

## 2017-02-26 NOTE — Patient Instructions (Addendum)
Start the carafate four times a day Increase the pantoprazole frequency to twice a day If you notice dark stools, contact Hospice right away If symptoms not significantly improving in a day or two, let's get a CBC Contact Dr. Zoila Shutter office about the prednisone Avoid spicy and acidic foods

## 2017-02-26 NOTE — Progress Notes (Signed)
BP 116/62   Pulse 97   Temp (!) 97.4 F (36.3 C) (Oral)   Resp 16   Wt 87 lb 11.2 oz (39.8 kg)   SpO2 93% Comment: 2L of o2  BMI 17.13 kg/m    Subjective:    Patient ID: Anita Atkins, female    DOB: 1935/04/09, 82 y.o.   MRN: [id number]  HPI: Anita Atkins is a 82 y.o. female  Chief Complaint  Patient presents with  . Follow-up    3 months    HPI Patient is here for f/u She is not feeling well today Stomach heaviness, started this morning; down below the ribs; gallbladder is out No blood in the stool; appetite is so so No NSAIDs No hx of pancreatitis Pain is not real severe; just enough to bother her She is just so tired  She is under the care of Hospice She has diabetes She has had some issues with UTIs per last note; no urinary control; large gushes and small leaks; unable to make it to the bathroom in time; bowels too  Depression screen [facility] 2/9 02/26/2017 11/27/2016 09/26/2016 02/13/2016 06/15/2015  Decreased Interest 0 0 0 0 0  Down, Depressed, Hopeless 1 0 0 1 1  PHQ - 2 Score 1 0 0 1 1    Relevant past medical, surgical, family and social history reviewed Past Medical History:  Diagnosis Date  . Diabetes mellitus without complication ([place])   . High cholesterol   . Monoclonal paraproteinemia 01/31/2016  . Pulmonary fibrosis ([place])   . RA (rheumatoid arthritis) ([place])   . Shingles   . Skin cancer   . Status post bariatric surgery 02/13/2016   Past Surgical History:  Procedure Laterality Date  . CARDIAC SURGERY    . CHOLECYSTECTOMY    . EXPLORATION POST OPERATIVE OPEN HEART    . GASTRIC BYPASS  2007  . MOHS SURGERY     Family History  Problem Relation Age of Onset  . Heart disease Mother   . Stroke Mother   . Diabetes Father   . Diabetes Sister   . Hyperlipidemia Sister   . Heart disease Sister   . Cancer Maternal Aunt        female  . Diabetes Brother   . Hypertension Brother   . Hyperlipidemia Daughter   . Fibromyalgia Daughter   . Stroke Maternal  Grandmother   . Arthritis Daughter   . COPD Neg Hx    Social History   Social History  . Marital status: Single    Spouse name: N/A  . Number of children: N/A  . Years of education: N/A   Occupational History  . retired    Social History Main Topics  . Smoking status: Never Smoker  . Smokeless tobacco: Never Used  . Alcohol use No  . Drug use: No  . Sexual activity: Not Currently   Other Topics Concern  . Not on file   Social History Narrative  . No narrative on file    Interim medical history since last visit reviewed. Allergies and medications reviewed  Review of Systems  Gastrointestinal: Positive for abdominal pain. Negative for blood in stool.   Per HPI unless specifically indicated above     Objective:    BP 116/62   Pulse 97   Temp (!) 97.4 F (36.3 C) (Oral)   Resp 16   Wt 87 lb 11.2 oz (39.8 kg)   SpO2 93% Comment: 2L of o2  BMI 17.13 kg/m  Wt Readings from Last 3 Encounters:  02/26/17 87 lb 11.2 oz (39.8 kg)  01/08/17 86 lb 9.6 oz (39.3 kg)  12/14/16 85 lb (38.6 kg)    Physical Exam  Constitutional: No distress (but appears weak, frail, ill).  Weak and frail, appears chronically ill, seated in wheelchair; very thin extremities  HENT:  Head: Normocephalic and atraumatic.  Cardiovascular: Normal rate and regular rhythm.   Pulmonary/Chest: Effort normal. No respiratory distress. She has decreased breath sounds.  Abdominal: Soft. She exhibits pulsatile midline mass (very thin, scaphoid abdomen). She exhibits no distension.  Neurological: She is alert. She displays no tremor.  Skin: She is not diaphoretic. No pallor.  Left side upper forehead, domed lesion with central scabbing; multilobular lesion on left side bridge of nose; large surgical incision left forearm (sites of skin cancer)  Psychiatric: She has a normal mood and affect. Her mood appears not anxious. Her affect is not blunt. She does not exhibit a depressed mood.  did not appear  hopeless or despondent   Diabetic Foot Form - Detailed   Diabetic Foot Exam - detailed Diabetic Foot exam was performed with the following findings:  Yes 02/26/2017  8:11 AM  Visual Foot Exam completed.:  Yes  Pulse Foot Exam completed.:  Yes  Right Dorsalis Pedis:  Present Left Dorsalis Pedis:  Present  Sensory Foot Exam Completed.:  Yes Semmes-Weinstein Monofilament Test R Site 1-Great Toe:  Pos L Site 1-Great Toe:  Pos         Results for orders placed or performed in visit on 12/25/16  Urinalysis w microscopic + reflex cultur  Result Value Ref Range   Color, Urine YELLOW YELLOW   APPearance CLEAR CLEAR   Specific Gravity, Urine 1.016 1.001 - 1.035   pH 6.5 5.0 - 8.0   Glucose, UA NEGATIVE NEGATIVE   Bilirubin Urine NEGATIVE NEGATIVE   Ketones, ur NEGATIVE NEGATIVE   Hgb urine dipstick NEGATIVE NEGATIVE   Protein, ur NEGATIVE NEGATIVE   Nitrite NEGATIVE NEGATIVE   Leukocytes, UA NEGATIVE NEGATIVE   WBC, UA 0-5 <=5 WBC/HPF   RBC / HPF NONE SEEN <=2 RBC/HPF   Squamous Epithelial / LPF 0-5 <=5 HPF   Bacteria, UA NONE SEEN NONE SEEN HPF   Crystals See Below (A) NONE SEEN HPF   Casts NONE SEEN NONE SEEN LPF   Yeast NONE SEEN NONE SEEN HPF      Assessment & Plan:   Problem List Items Addressed This Visit      Respiratory   Pulmonary fibrosis (HCC) (Chronic)    End stage, on Hospice care; f/u with pulmonologist        Endocrine   Type 2 diabetes mellitus without complication, without long-term current use of insulin (HCC) (Chronic)    Foot exam by MD today; hospice care, so not being aggressive with A1cs       Other Visit Diagnoses    Gastroesophageal reflux disease without esophagitis       discussed red flags; how aggressive to be w/work-up since Hospice pt; we'll try PPI and carafate, reasons to seek medical care reviewed   Relevant Medications   sucralfate (CARAFATE) 1 GM/10ML suspension   pantoprazole (PROTONIX) 20 MG tablet       Follow up plan: Return  in about 3 months (around 05/29/2017).  An after-visit summary was printed and given to the patient at [facility].  Please see the patient instructions which may contain other information and recommendations beyond what is mentioned above  in the assessment and plan.  Meds ordered this encounter  Medications  . sucralfate (CARAFATE) 1 GM/10ML suspension    Sig: Take 10 mLs (1 g total) by mouth 4 (four) times daily -  with meals and at bedtime.    Dispense:  420 mL    Refill:  0  . pantoprazole (PROTONIX) 20 MG tablet    Sig: Take 1 tablet (20 mg total) by mouth 2 (two) times daily before a meal.    Dispense:  60 tablet    Refill:  0    Temporarily increasing to BID dosing    No orders of the defined types were placed in this encounter.

## 2017-02-27 DIAGNOSIS — E1142 Type 2 diabetes mellitus with diabetic polyneuropathy: Secondary | ICD-10-CM | POA: Diagnosis not present

## 2017-02-27 DIAGNOSIS — E43 Unspecified severe protein-calorie malnutrition: Secondary | ICD-10-CM | POA: Diagnosis not present

## 2017-02-27 DIAGNOSIS — R64 Cachexia: Secondary | ICD-10-CM | POA: Diagnosis not present

## 2017-02-27 DIAGNOSIS — J9611 Chronic respiratory failure with hypoxia: Secondary | ICD-10-CM | POA: Diagnosis not present

## 2017-02-27 DIAGNOSIS — R634 Abnormal weight loss: Secondary | ICD-10-CM | POA: Diagnosis not present

## 2017-02-27 DIAGNOSIS — J8410 Pulmonary fibrosis, unspecified: Secondary | ICD-10-CM | POA: Diagnosis not present

## 2017-02-28 DIAGNOSIS — E1142 Type 2 diabetes mellitus with diabetic polyneuropathy: Secondary | ICD-10-CM | POA: Diagnosis not present

## 2017-02-28 DIAGNOSIS — E43 Unspecified severe protein-calorie malnutrition: Secondary | ICD-10-CM | POA: Diagnosis not present

## 2017-02-28 DIAGNOSIS — R64 Cachexia: Secondary | ICD-10-CM | POA: Diagnosis not present

## 2017-02-28 DIAGNOSIS — J9611 Chronic respiratory failure with hypoxia: Secondary | ICD-10-CM | POA: Diagnosis not present

## 2017-02-28 DIAGNOSIS — J8410 Pulmonary fibrosis, unspecified: Secondary | ICD-10-CM | POA: Diagnosis not present

## 2017-02-28 DIAGNOSIS — R634 Abnormal weight loss: Secondary | ICD-10-CM | POA: Diagnosis not present

## 2017-03-04 NOTE — Assessment & Plan Note (Signed)
Foot exam by MD today; hospice care, so not being aggressive with A1cs

## 2017-03-04 NOTE — Assessment & Plan Note (Signed)
End stage, on Hospice care; f/u with pulmonologist

## 2017-03-05 ENCOUNTER — Encounter: Payer: Self-pay | Admitting: Family Medicine

## 2017-03-05 DIAGNOSIS — D0439 Carcinoma in situ of skin of other parts of face: Secondary | ICD-10-CM | POA: Diagnosis not present

## 2017-03-05 DIAGNOSIS — C44329 Squamous cell carcinoma of skin of other parts of face: Secondary | ICD-10-CM | POA: Diagnosis not present

## 2017-03-07 ENCOUNTER — Telehealth: Payer: Self-pay | Admitting: Family Medicine

## 2017-03-07 DIAGNOSIS — R64 Cachexia: Secondary | ICD-10-CM | POA: Diagnosis not present

## 2017-03-07 DIAGNOSIS — R634 Abnormal weight loss: Secondary | ICD-10-CM | POA: Diagnosis not present

## 2017-03-07 DIAGNOSIS — E1142 Type 2 diabetes mellitus with diabetic polyneuropathy: Secondary | ICD-10-CM | POA: Diagnosis not present

## 2017-03-07 DIAGNOSIS — J8410 Pulmonary fibrosis, unspecified: Secondary | ICD-10-CM | POA: Diagnosis not present

## 2017-03-07 DIAGNOSIS — J9611 Chronic respiratory failure with hypoxia: Secondary | ICD-10-CM | POA: Diagnosis not present

## 2017-03-07 DIAGNOSIS — E43 Unspecified severe protein-calorie malnutrition: Secondary | ICD-10-CM | POA: Diagnosis not present

## 2017-03-07 NOTE — Telephone Encounter (Signed)
[employer] forms received and placed in DR. Ladas file for review.

## 2017-03-07 NOTE — Telephone Encounter (Signed)
Pt dropped off FMLA forms on 03/06/17. Forms have been placed in Dr Delight Ovens folder and given to the [facility]. Please advise as to when these are complete so front staff can call the pt for pick up. Thanks

## 2017-03-08 DIAGNOSIS — B351 Tinea unguium: Secondary | ICD-10-CM | POA: Diagnosis not present

## 2017-03-08 DIAGNOSIS — M79675 Pain in left toe(s): Secondary | ICD-10-CM | POA: Diagnosis not present

## 2017-03-08 DIAGNOSIS — L600 Ingrowing nail: Secondary | ICD-10-CM | POA: Diagnosis not present

## 2017-03-08 DIAGNOSIS — M79674 Pain in right toe(s): Secondary | ICD-10-CM | POA: Diagnosis not present

## 2017-03-13 NOTE — Telephone Encounter (Signed)
notified

## 2017-03-13 NOTE — Telephone Encounter (Signed)
Please fill out as much as you can and I'll sign first thing int he morning; she can pick this up or we can fax it tomorrow morning

## 2017-03-15 DIAGNOSIS — E43 Unspecified severe protein-calorie malnutrition: Secondary | ICD-10-CM | POA: Diagnosis not present

## 2017-03-15 DIAGNOSIS — R634 Abnormal weight loss: Secondary | ICD-10-CM | POA: Diagnosis not present

## 2017-03-15 DIAGNOSIS — R64 Cachexia: Secondary | ICD-10-CM | POA: Diagnosis not present

## 2017-03-15 DIAGNOSIS — J8410 Pulmonary fibrosis, unspecified: Secondary | ICD-10-CM | POA: Diagnosis not present

## 2017-03-15 DIAGNOSIS — E1142 Type 2 diabetes mellitus with diabetic polyneuropathy: Secondary | ICD-10-CM | POA: Diagnosis not present

## 2017-03-15 DIAGNOSIS — J9611 Chronic respiratory failure with hypoxia: Secondary | ICD-10-CM | POA: Diagnosis not present

## 2017-03-19 ENCOUNTER — Other Ambulatory Visit: Payer: Self-pay | Admitting: Family Medicine

## 2017-03-19 DIAGNOSIS — J9611 Chronic respiratory failure with hypoxia: Secondary | ICD-10-CM | POA: Diagnosis not present

## 2017-03-19 DIAGNOSIS — R634 Abnormal weight loss: Secondary | ICD-10-CM | POA: Diagnosis not present

## 2017-03-19 DIAGNOSIS — R64 Cachexia: Secondary | ICD-10-CM | POA: Diagnosis not present

## 2017-03-19 DIAGNOSIS — J8410 Pulmonary fibrosis, unspecified: Secondary | ICD-10-CM | POA: Diagnosis not present

## 2017-03-19 DIAGNOSIS — E1142 Type 2 diabetes mellitus with diabetic polyneuropathy: Secondary | ICD-10-CM | POA: Diagnosis not present

## 2017-03-19 DIAGNOSIS — E43 Unspecified severe protein-calorie malnutrition: Secondary | ICD-10-CM | POA: Diagnosis not present

## 2017-03-19 MED ORDER — DOXYCYCLINE HYCLATE 100 MG PO TABS: 100.0000 mg | ORAL_TABLET | Freq: Two times a day (BID) | ORAL | 0 refills | Status: AC

## 2017-03-19 NOTE — Telephone Encounter (Signed)
Mitzi calling from Hospice (/[facility]). Requesting new order for 5/325mg  oxycodone-ace. Directions states one table every 4 hours as needed for 15 day supply. Mitizi states pt is taking these regularly due to having surgery for skin cancer removal on her forehead. Pt uses [facility] [phone]

## 2017-03-19 NOTE — Telephone Encounter (Signed)
They will need to call her surgeon / dermatologist for any needed refills; one prescriber please; thank you

## 2017-03-19 NOTE — Telephone Encounter (Signed)
Anita Atkins has been notified. Pt also have symptoms of another UTI. Symptoms are: abdominal pain. Not feeling like herself, urine has a foul order and cloudy. Urine specimen has been sent to the lab for further testing and she will fax over the results.

## 2017-03-19 NOTE — Telephone Encounter (Signed)
I approve order for UA with culture and sensitivities Please have her start doxy, I already sent in Rx

## 2017-03-20 DIAGNOSIS — J8410 Pulmonary fibrosis, unspecified: Secondary | ICD-10-CM | POA: Diagnosis not present

## 2017-03-20 DIAGNOSIS — R634 Abnormal weight loss: Secondary | ICD-10-CM | POA: Diagnosis not present

## 2017-03-20 DIAGNOSIS — E1142 Type 2 diabetes mellitus with diabetic polyneuropathy: Secondary | ICD-10-CM | POA: Diagnosis not present

## 2017-03-20 DIAGNOSIS — R64 Cachexia: Secondary | ICD-10-CM | POA: Diagnosis not present

## 2017-03-20 DIAGNOSIS — E43 Unspecified severe protein-calorie malnutrition: Secondary | ICD-10-CM | POA: Diagnosis not present

## 2017-03-20 DIAGNOSIS — J9611 Chronic respiratory failure with hypoxia: Secondary | ICD-10-CM | POA: Diagnosis not present

## 2017-03-20 NOTE — Telephone Encounter (Signed)
Mitzi has been notified. [facility] but it was suppose to be sent to [facility] on [address]. Canceled the [facility] at [facility] and did a verbal at [facility].

## 2017-03-22 ENCOUNTER — Telehealth: Payer: Self-pay | Admitting: Internal Medicine

## 2017-03-22 DIAGNOSIS — R64 Cachexia: Secondary | ICD-10-CM | POA: Diagnosis not present

## 2017-03-22 DIAGNOSIS — E43 Unspecified severe protein-calorie malnutrition: Secondary | ICD-10-CM | POA: Diagnosis not present

## 2017-03-22 DIAGNOSIS — J8410 Pulmonary fibrosis, unspecified: Secondary | ICD-10-CM | POA: Diagnosis not present

## 2017-03-22 DIAGNOSIS — J9611 Chronic respiratory failure with hypoxia: Secondary | ICD-10-CM | POA: Diagnosis not present

## 2017-03-22 DIAGNOSIS — E1142 Type 2 diabetes mellitus with diabetic polyneuropathy: Secondary | ICD-10-CM | POA: Diagnosis not present

## 2017-03-22 DIAGNOSIS — R634 Abnormal weight loss: Secondary | ICD-10-CM | POA: Diagnosis not present

## 2017-03-22 MED ORDER — OXYCODONE-ACETAMINOPHEN 5-325 MG PO TABS: 1.0000 | ORAL_TABLET | Freq: Four times a day (QID) | ORAL | 0 refills | Status: DC | PRN

## 2017-03-22 NOTE — Telephone Encounter (Signed)
Please prescibe

## 2017-03-22 NOTE — Telephone Encounter (Signed)
Hospice Mitzi notified rx sent to pharmacy.

## 2017-03-22 NOTE — Telephone Encounter (Signed)
Anita Atkins with [facility] calling   Needs an order for Oxycodone-acetaminophen 5-325 mg  1 every 4 hours as needed For pain and SOB  pcp is no longer willing to write this   If we can call this into [facility] [address]  Please advise

## 2017-03-25 ENCOUNTER — Encounter: Payer: Self-pay | Admitting: Family Medicine

## 2017-03-26 DIAGNOSIS — S0180XA Unspecified open wound of other part of head, initial encounter: Secondary | ICD-10-CM | POA: Diagnosis not present

## 2017-03-27 DIAGNOSIS — E559 Vitamin D deficiency, unspecified: Secondary | ICD-10-CM | POA: Diagnosis not present

## 2017-03-27 DIAGNOSIS — I2510 Atherosclerotic heart disease of native coronary artery without angina pectoris: Secondary | ICD-10-CM | POA: Diagnosis not present

## 2017-03-27 DIAGNOSIS — E7800 Pure hypercholesterolemia, unspecified: Secondary | ICD-10-CM | POA: Diagnosis not present

## 2017-03-27 DIAGNOSIS — F329 Major depressive disorder, single episode, unspecified: Secondary | ICD-10-CM | POA: Diagnosis not present

## 2017-03-27 DIAGNOSIS — R64 Cachexia: Secondary | ICD-10-CM | POA: Diagnosis not present

## 2017-03-27 DIAGNOSIS — M069 Rheumatoid arthritis, unspecified: Secondary | ICD-10-CM | POA: Diagnosis not present

## 2017-03-27 DIAGNOSIS — J8410 Pulmonary fibrosis, unspecified: Secondary | ICD-10-CM | POA: Diagnosis not present

## 2017-03-27 DIAGNOSIS — Z9981 Dependence on supplemental oxygen: Secondary | ICD-10-CM | POA: Diagnosis not present

## 2017-03-27 DIAGNOSIS — J9611 Chronic respiratory failure with hypoxia: Secondary | ICD-10-CM | POA: Diagnosis not present

## 2017-03-27 DIAGNOSIS — D472 Monoclonal gammopathy: Secondary | ICD-10-CM | POA: Diagnosis not present

## 2017-03-27 DIAGNOSIS — E43 Unspecified severe protein-calorie malnutrition: Secondary | ICD-10-CM | POA: Diagnosis not present

## 2017-03-27 DIAGNOSIS — Z4801 Encounter for change or removal of surgical wound dressing: Secondary | ICD-10-CM | POA: Diagnosis not present

## 2017-03-27 DIAGNOSIS — L89152 Pressure ulcer of sacral region, stage 2: Secondary | ICD-10-CM | POA: Diagnosis not present

## 2017-03-27 DIAGNOSIS — R634 Abnormal weight loss: Secondary | ICD-10-CM | POA: Diagnosis not present

## 2017-03-27 DIAGNOSIS — M810 Age-related osteoporosis without current pathological fracture: Secondary | ICD-10-CM | POA: Diagnosis not present

## 2017-03-27 DIAGNOSIS — E1142 Type 2 diabetes mellitus with diabetic polyneuropathy: Secondary | ICD-10-CM | POA: Diagnosis not present

## 2017-03-27 DIAGNOSIS — L89302 Pressure ulcer of unspecified buttock, stage 2: Secondary | ICD-10-CM | POA: Diagnosis not present

## 2017-03-27 DIAGNOSIS — R3915 Urgency of urination: Secondary | ICD-10-CM | POA: Diagnosis not present

## 2017-03-27 DIAGNOSIS — Z9884 Bariatric surgery status: Secondary | ICD-10-CM | POA: Diagnosis not present

## 2017-03-27 DIAGNOSIS — Z681 Body mass index (BMI) 19 or less, adult: Secondary | ICD-10-CM | POA: Diagnosis not present

## 2017-03-28 DIAGNOSIS — R64 Cachexia: Secondary | ICD-10-CM | POA: Diagnosis not present

## 2017-03-28 DIAGNOSIS — J9611 Chronic respiratory failure with hypoxia: Secondary | ICD-10-CM | POA: Diagnosis not present

## 2017-03-28 DIAGNOSIS — E1142 Type 2 diabetes mellitus with diabetic polyneuropathy: Secondary | ICD-10-CM | POA: Diagnosis not present

## 2017-03-28 DIAGNOSIS — J8410 Pulmonary fibrosis, unspecified: Secondary | ICD-10-CM | POA: Diagnosis not present

## 2017-03-28 DIAGNOSIS — R634 Abnormal weight loss: Secondary | ICD-10-CM | POA: Diagnosis not present

## 2017-03-28 DIAGNOSIS — E43 Unspecified severe protein-calorie malnutrition: Secondary | ICD-10-CM | POA: Diagnosis not present

## 2017-04-04 DIAGNOSIS — R64 Cachexia: Secondary | ICD-10-CM | POA: Diagnosis not present

## 2017-04-04 DIAGNOSIS — J9611 Chronic respiratory failure with hypoxia: Secondary | ICD-10-CM | POA: Diagnosis not present

## 2017-04-04 DIAGNOSIS — E1142 Type 2 diabetes mellitus with diabetic polyneuropathy: Secondary | ICD-10-CM | POA: Diagnosis not present

## 2017-04-04 DIAGNOSIS — R634 Abnormal weight loss: Secondary | ICD-10-CM | POA: Diagnosis not present

## 2017-04-04 DIAGNOSIS — J8410 Pulmonary fibrosis, unspecified: Secondary | ICD-10-CM | POA: Diagnosis not present

## 2017-04-04 DIAGNOSIS — E43 Unspecified severe protein-calorie malnutrition: Secondary | ICD-10-CM | POA: Diagnosis not present

## 2017-04-05 DIAGNOSIS — E1142 Type 2 diabetes mellitus with diabetic polyneuropathy: Secondary | ICD-10-CM | POA: Diagnosis not present

## 2017-04-05 DIAGNOSIS — J8410 Pulmonary fibrosis, unspecified: Secondary | ICD-10-CM | POA: Diagnosis not present

## 2017-04-05 DIAGNOSIS — J9611 Chronic respiratory failure with hypoxia: Secondary | ICD-10-CM | POA: Diagnosis not present

## 2017-04-05 DIAGNOSIS — E43 Unspecified severe protein-calorie malnutrition: Secondary | ICD-10-CM | POA: Diagnosis not present

## 2017-04-05 DIAGNOSIS — R64 Cachexia: Secondary | ICD-10-CM | POA: Diagnosis not present

## 2017-04-05 DIAGNOSIS — R634 Abnormal weight loss: Secondary | ICD-10-CM | POA: Diagnosis not present

## 2017-04-09 DIAGNOSIS — C44321 Squamous cell carcinoma of skin of nose: Secondary | ICD-10-CM | POA: Diagnosis not present

## 2017-04-10 DIAGNOSIS — E1142 Type 2 diabetes mellitus with diabetic polyneuropathy: Secondary | ICD-10-CM | POA: Diagnosis not present

## 2017-04-10 DIAGNOSIS — E43 Unspecified severe protein-calorie malnutrition: Secondary | ICD-10-CM | POA: Diagnosis not present

## 2017-04-10 DIAGNOSIS — R634 Abnormal weight loss: Secondary | ICD-10-CM | POA: Diagnosis not present

## 2017-04-10 DIAGNOSIS — J9611 Chronic respiratory failure with hypoxia: Secondary | ICD-10-CM | POA: Diagnosis not present

## 2017-04-10 DIAGNOSIS — R64 Cachexia: Secondary | ICD-10-CM | POA: Diagnosis not present

## 2017-04-10 DIAGNOSIS — J8410 Pulmonary fibrosis, unspecified: Secondary | ICD-10-CM | POA: Diagnosis not present

## 2017-04-11 DIAGNOSIS — E43 Unspecified severe protein-calorie malnutrition: Secondary | ICD-10-CM | POA: Diagnosis not present

## 2017-04-11 DIAGNOSIS — J9611 Chronic respiratory failure with hypoxia: Secondary | ICD-10-CM | POA: Diagnosis not present

## 2017-04-11 DIAGNOSIS — R634 Abnormal weight loss: Secondary | ICD-10-CM | POA: Diagnosis not present

## 2017-04-11 DIAGNOSIS — E1142 Type 2 diabetes mellitus with diabetic polyneuropathy: Secondary | ICD-10-CM | POA: Diagnosis not present

## 2017-04-11 DIAGNOSIS — J8410 Pulmonary fibrosis, unspecified: Secondary | ICD-10-CM | POA: Diagnosis not present

## 2017-04-11 DIAGNOSIS — R64 Cachexia: Secondary | ICD-10-CM | POA: Diagnosis not present

## 2017-04-16 DIAGNOSIS — E43 Unspecified severe protein-calorie malnutrition: Secondary | ICD-10-CM | POA: Diagnosis not present

## 2017-04-16 DIAGNOSIS — J9611 Chronic respiratory failure with hypoxia: Secondary | ICD-10-CM | POA: Diagnosis not present

## 2017-04-16 DIAGNOSIS — R634 Abnormal weight loss: Secondary | ICD-10-CM | POA: Diagnosis not present

## 2017-04-16 DIAGNOSIS — E1142 Type 2 diabetes mellitus with diabetic polyneuropathy: Secondary | ICD-10-CM | POA: Diagnosis not present

## 2017-04-16 DIAGNOSIS — R64 Cachexia: Secondary | ICD-10-CM | POA: Diagnosis not present

## 2017-04-16 DIAGNOSIS — J8410 Pulmonary fibrosis, unspecified: Secondary | ICD-10-CM | POA: Diagnosis not present

## 2017-04-18 DIAGNOSIS — R634 Abnormal weight loss: Secondary | ICD-10-CM | POA: Diagnosis not present

## 2017-04-18 DIAGNOSIS — E1142 Type 2 diabetes mellitus with diabetic polyneuropathy: Secondary | ICD-10-CM | POA: Diagnosis not present

## 2017-04-18 DIAGNOSIS — E43 Unspecified severe protein-calorie malnutrition: Secondary | ICD-10-CM | POA: Diagnosis not present

## 2017-04-18 DIAGNOSIS — J9611 Chronic respiratory failure with hypoxia: Secondary | ICD-10-CM | POA: Diagnosis not present

## 2017-04-18 DIAGNOSIS — R64 Cachexia: Secondary | ICD-10-CM | POA: Diagnosis not present

## 2017-04-18 DIAGNOSIS — J8410 Pulmonary fibrosis, unspecified: Secondary | ICD-10-CM | POA: Diagnosis not present

## 2017-04-22 DIAGNOSIS — J8410 Pulmonary fibrosis, unspecified: Secondary | ICD-10-CM | POA: Diagnosis not present

## 2017-04-22 DIAGNOSIS — J9611 Chronic respiratory failure with hypoxia: Secondary | ICD-10-CM | POA: Diagnosis not present

## 2017-04-22 DIAGNOSIS — R634 Abnormal weight loss: Secondary | ICD-10-CM | POA: Diagnosis not present

## 2017-04-22 DIAGNOSIS — E43 Unspecified severe protein-calorie malnutrition: Secondary | ICD-10-CM | POA: Diagnosis not present

## 2017-04-22 DIAGNOSIS — E1142 Type 2 diabetes mellitus with diabetic polyneuropathy: Secondary | ICD-10-CM | POA: Diagnosis not present

## 2017-04-22 DIAGNOSIS — R64 Cachexia: Secondary | ICD-10-CM | POA: Diagnosis not present

## 2017-04-25 DIAGNOSIS — R634 Abnormal weight loss: Secondary | ICD-10-CM | POA: Diagnosis not present

## 2017-04-25 DIAGNOSIS — J8410 Pulmonary fibrosis, unspecified: Secondary | ICD-10-CM | POA: Diagnosis not present

## 2017-04-25 DIAGNOSIS — E1142 Type 2 diabetes mellitus with diabetic polyneuropathy: Secondary | ICD-10-CM | POA: Diagnosis not present

## 2017-04-25 DIAGNOSIS — R64 Cachexia: Secondary | ICD-10-CM | POA: Diagnosis not present

## 2017-04-25 DIAGNOSIS — J9611 Chronic respiratory failure with hypoxia: Secondary | ICD-10-CM | POA: Diagnosis not present

## 2017-04-25 DIAGNOSIS — E43 Unspecified severe protein-calorie malnutrition: Secondary | ICD-10-CM | POA: Diagnosis not present

## 2017-04-27 DIAGNOSIS — M069 Rheumatoid arthritis, unspecified: Secondary | ICD-10-CM | POA: Diagnosis not present

## 2017-04-27 DIAGNOSIS — L89152 Pressure ulcer of sacral region, stage 2: Secondary | ICD-10-CM | POA: Diagnosis not present

## 2017-04-27 DIAGNOSIS — M810 Age-related osteoporosis without current pathological fracture: Secondary | ICD-10-CM | POA: Diagnosis not present

## 2017-04-27 DIAGNOSIS — L89302 Pressure ulcer of unspecified buttock, stage 2: Secondary | ICD-10-CM | POA: Diagnosis not present

## 2017-04-27 DIAGNOSIS — E43 Unspecified severe protein-calorie malnutrition: Secondary | ICD-10-CM | POA: Diagnosis not present

## 2017-04-27 DIAGNOSIS — E1142 Type 2 diabetes mellitus with diabetic polyneuropathy: Secondary | ICD-10-CM | POA: Diagnosis not present

## 2017-04-27 DIAGNOSIS — J9611 Chronic respiratory failure with hypoxia: Secondary | ICD-10-CM | POA: Diagnosis not present

## 2017-04-27 DIAGNOSIS — D472 Monoclonal gammopathy: Secondary | ICD-10-CM | POA: Diagnosis not present

## 2017-04-27 DIAGNOSIS — F329 Major depressive disorder, single episode, unspecified: Secondary | ICD-10-CM | POA: Diagnosis not present

## 2017-04-27 DIAGNOSIS — J8410 Pulmonary fibrosis, unspecified: Secondary | ICD-10-CM | POA: Diagnosis not present

## 2017-04-27 DIAGNOSIS — Z681 Body mass index (BMI) 19 or less, adult: Secondary | ICD-10-CM | POA: Diagnosis not present

## 2017-04-27 DIAGNOSIS — R64 Cachexia: Secondary | ICD-10-CM | POA: Diagnosis not present

## 2017-04-27 DIAGNOSIS — I2510 Atherosclerotic heart disease of native coronary artery without angina pectoris: Secondary | ICD-10-CM | POA: Diagnosis not present

## 2017-04-27 DIAGNOSIS — Z4801 Encounter for change or removal of surgical wound dressing: Secondary | ICD-10-CM | POA: Diagnosis not present

## 2017-04-27 DIAGNOSIS — E559 Vitamin D deficiency, unspecified: Secondary | ICD-10-CM | POA: Diagnosis not present

## 2017-04-27 DIAGNOSIS — R634 Abnormal weight loss: Secondary | ICD-10-CM | POA: Diagnosis not present

## 2017-04-27 DIAGNOSIS — Z9884 Bariatric surgery status: Secondary | ICD-10-CM | POA: Diagnosis not present

## 2017-04-27 DIAGNOSIS — E7800 Pure hypercholesterolemia, unspecified: Secondary | ICD-10-CM | POA: Diagnosis not present

## 2017-04-27 DIAGNOSIS — Z9981 Dependence on supplemental oxygen: Secondary | ICD-10-CM | POA: Diagnosis not present

## 2017-04-27 DIAGNOSIS — R3915 Urgency of urination: Secondary | ICD-10-CM | POA: Diagnosis not present

## 2017-05-02 DIAGNOSIS — E1142 Type 2 diabetes mellitus with diabetic polyneuropathy: Secondary | ICD-10-CM | POA: Diagnosis not present

## 2017-05-02 DIAGNOSIS — J8410 Pulmonary fibrosis, unspecified: Secondary | ICD-10-CM | POA: Diagnosis not present

## 2017-05-02 DIAGNOSIS — E43 Unspecified severe protein-calorie malnutrition: Secondary | ICD-10-CM | POA: Diagnosis not present

## 2017-05-02 DIAGNOSIS — R64 Cachexia: Secondary | ICD-10-CM | POA: Diagnosis not present

## 2017-05-02 DIAGNOSIS — R634 Abnormal weight loss: Secondary | ICD-10-CM | POA: Diagnosis not present

## 2017-05-02 DIAGNOSIS — J9611 Chronic respiratory failure with hypoxia: Secondary | ICD-10-CM | POA: Diagnosis not present

## 2017-05-07 DIAGNOSIS — E1142 Type 2 diabetes mellitus with diabetic polyneuropathy: Secondary | ICD-10-CM | POA: Diagnosis not present

## 2017-05-07 DIAGNOSIS — E43 Unspecified severe protein-calorie malnutrition: Secondary | ICD-10-CM | POA: Diagnosis not present

## 2017-05-07 DIAGNOSIS — R64 Cachexia: Secondary | ICD-10-CM | POA: Diagnosis not present

## 2017-05-07 DIAGNOSIS — J9611 Chronic respiratory failure with hypoxia: Secondary | ICD-10-CM | POA: Diagnosis not present

## 2017-05-07 DIAGNOSIS — R634 Abnormal weight loss: Secondary | ICD-10-CM | POA: Diagnosis not present

## 2017-05-07 DIAGNOSIS — J8410 Pulmonary fibrosis, unspecified: Secondary | ICD-10-CM | POA: Diagnosis not present

## 2017-05-09 DIAGNOSIS — R64 Cachexia: Secondary | ICD-10-CM | POA: Diagnosis not present

## 2017-05-09 DIAGNOSIS — R634 Abnormal weight loss: Secondary | ICD-10-CM | POA: Diagnosis not present

## 2017-05-09 DIAGNOSIS — J9611 Chronic respiratory failure with hypoxia: Secondary | ICD-10-CM | POA: Diagnosis not present

## 2017-05-09 DIAGNOSIS — E43 Unspecified severe protein-calorie malnutrition: Secondary | ICD-10-CM | POA: Diagnosis not present

## 2017-05-09 DIAGNOSIS — J8410 Pulmonary fibrosis, unspecified: Secondary | ICD-10-CM | POA: Diagnosis not present

## 2017-05-09 DIAGNOSIS — E1142 Type 2 diabetes mellitus with diabetic polyneuropathy: Secondary | ICD-10-CM | POA: Diagnosis not present

## 2017-05-10 DIAGNOSIS — J9611 Chronic respiratory failure with hypoxia: Secondary | ICD-10-CM | POA: Diagnosis not present

## 2017-05-10 DIAGNOSIS — E1142 Type 2 diabetes mellitus with diabetic polyneuropathy: Secondary | ICD-10-CM | POA: Diagnosis not present

## 2017-05-10 DIAGNOSIS — E43 Unspecified severe protein-calorie malnutrition: Secondary | ICD-10-CM | POA: Diagnosis not present

## 2017-05-10 DIAGNOSIS — J8410 Pulmonary fibrosis, unspecified: Secondary | ICD-10-CM | POA: Diagnosis not present

## 2017-05-10 DIAGNOSIS — R634 Abnormal weight loss: Secondary | ICD-10-CM | POA: Diagnosis not present

## 2017-05-10 DIAGNOSIS — R64 Cachexia: Secondary | ICD-10-CM | POA: Diagnosis not present

## 2017-05-16 DIAGNOSIS — R634 Abnormal weight loss: Secondary | ICD-10-CM | POA: Diagnosis not present

## 2017-05-16 DIAGNOSIS — E1142 Type 2 diabetes mellitus with diabetic polyneuropathy: Secondary | ICD-10-CM | POA: Diagnosis not present

## 2017-05-16 DIAGNOSIS — E43 Unspecified severe protein-calorie malnutrition: Secondary | ICD-10-CM | POA: Diagnosis not present

## 2017-05-16 DIAGNOSIS — J9611 Chronic respiratory failure with hypoxia: Secondary | ICD-10-CM | POA: Diagnosis not present

## 2017-05-16 DIAGNOSIS — J8410 Pulmonary fibrosis, unspecified: Secondary | ICD-10-CM | POA: Diagnosis not present

## 2017-05-16 DIAGNOSIS — R64 Cachexia: Secondary | ICD-10-CM | POA: Diagnosis not present

## 2017-05-23 ENCOUNTER — Other Ambulatory Visit: Payer: Self-pay | Admitting: Family Medicine

## 2017-05-23 DIAGNOSIS — R634 Abnormal weight loss: Secondary | ICD-10-CM | POA: Diagnosis not present

## 2017-05-23 DIAGNOSIS — R64 Cachexia: Secondary | ICD-10-CM | POA: Diagnosis not present

## 2017-05-23 DIAGNOSIS — E43 Unspecified severe protein-calorie malnutrition: Secondary | ICD-10-CM | POA: Diagnosis not present

## 2017-05-23 DIAGNOSIS — E1142 Type 2 diabetes mellitus with diabetic polyneuropathy: Secondary | ICD-10-CM | POA: Diagnosis not present

## 2017-05-23 DIAGNOSIS — J8410 Pulmonary fibrosis, unspecified: Secondary | ICD-10-CM | POA: Diagnosis not present

## 2017-05-23 DIAGNOSIS — J9611 Chronic respiratory failure with hypoxia: Secondary | ICD-10-CM | POA: Diagnosis not present

## 2017-05-23 NOTE — Telephone Encounter (Signed)
This should either be through Dr. Zoila Shutter office if for pulmonary reasons or Dr. Vernie Shanks office if for rheum reasons Thank you

## 2017-05-24 NOTE — Telephone Encounter (Signed)
Spoke to the pharmacy tech and asked them to send those medicaiton refills to her pumlogist. Gave them Dr. Mortimer Fries fax number.

## 2017-05-25 DIAGNOSIS — E1142 Type 2 diabetes mellitus with diabetic polyneuropathy: Secondary | ICD-10-CM | POA: Diagnosis not present

## 2017-05-25 DIAGNOSIS — E43 Unspecified severe protein-calorie malnutrition: Secondary | ICD-10-CM | POA: Diagnosis not present

## 2017-05-25 DIAGNOSIS — J9611 Chronic respiratory failure with hypoxia: Secondary | ICD-10-CM | POA: Diagnosis not present

## 2017-05-25 DIAGNOSIS — J8410 Pulmonary fibrosis, unspecified: Secondary | ICD-10-CM | POA: Diagnosis not present

## 2017-05-25 DIAGNOSIS — R64 Cachexia: Secondary | ICD-10-CM | POA: Diagnosis not present

## 2017-05-25 DIAGNOSIS — R634 Abnormal weight loss: Secondary | ICD-10-CM | POA: Diagnosis not present

## 2017-05-27 DIAGNOSIS — E43 Unspecified severe protein-calorie malnutrition: Secondary | ICD-10-CM | POA: Diagnosis not present

## 2017-05-27 DIAGNOSIS — D472 Monoclonal gammopathy: Secondary | ICD-10-CM | POA: Diagnosis not present

## 2017-05-27 DIAGNOSIS — J8410 Pulmonary fibrosis, unspecified: Secondary | ICD-10-CM | POA: Diagnosis not present

## 2017-05-27 DIAGNOSIS — Z681 Body mass index (BMI) 19 or less, adult: Secondary | ICD-10-CM | POA: Diagnosis not present

## 2017-05-27 DIAGNOSIS — Z9981 Dependence on supplemental oxygen: Secondary | ICD-10-CM | POA: Diagnosis not present

## 2017-05-27 DIAGNOSIS — R634 Abnormal weight loss: Secondary | ICD-10-CM | POA: Diagnosis not present

## 2017-05-27 DIAGNOSIS — M069 Rheumatoid arthritis, unspecified: Secondary | ICD-10-CM | POA: Diagnosis not present

## 2017-05-27 DIAGNOSIS — R64 Cachexia: Secondary | ICD-10-CM | POA: Diagnosis not present

## 2017-05-27 DIAGNOSIS — E559 Vitamin D deficiency, unspecified: Secondary | ICD-10-CM | POA: Diagnosis not present

## 2017-05-27 DIAGNOSIS — L89302 Pressure ulcer of unspecified buttock, stage 2: Secondary | ICD-10-CM | POA: Diagnosis not present

## 2017-05-27 DIAGNOSIS — Z9884 Bariatric surgery status: Secondary | ICD-10-CM | POA: Diagnosis not present

## 2017-05-27 DIAGNOSIS — M810 Age-related osteoporosis without current pathological fracture: Secondary | ICD-10-CM | POA: Diagnosis not present

## 2017-05-27 DIAGNOSIS — Z4801 Encounter for change or removal of surgical wound dressing: Secondary | ICD-10-CM | POA: Diagnosis not present

## 2017-05-27 DIAGNOSIS — F329 Major depressive disorder, single episode, unspecified: Secondary | ICD-10-CM | POA: Diagnosis not present

## 2017-05-27 DIAGNOSIS — E7800 Pure hypercholesterolemia, unspecified: Secondary | ICD-10-CM | POA: Diagnosis not present

## 2017-05-27 DIAGNOSIS — L89152 Pressure ulcer of sacral region, stage 2: Secondary | ICD-10-CM | POA: Diagnosis not present

## 2017-05-27 DIAGNOSIS — I2510 Atherosclerotic heart disease of native coronary artery without angina pectoris: Secondary | ICD-10-CM | POA: Diagnosis not present

## 2017-05-27 DIAGNOSIS — E1142 Type 2 diabetes mellitus with diabetic polyneuropathy: Secondary | ICD-10-CM | POA: Diagnosis not present

## 2017-05-27 DIAGNOSIS — J9611 Chronic respiratory failure with hypoxia: Secondary | ICD-10-CM | POA: Diagnosis not present

## 2017-05-27 DIAGNOSIS — R3915 Urgency of urination: Secondary | ICD-10-CM | POA: Diagnosis not present

## 2017-05-30 ENCOUNTER — Encounter: Payer: Self-pay | Admitting: Family Medicine

## 2017-05-30 ENCOUNTER — Ambulatory Visit (INDEPENDENT_AMBULATORY_CARE_PROVIDER_SITE_OTHER): Payer: Medicare Other | Admitting: Family Medicine

## 2017-05-30 VITALS — BP 98/56 | HR 79 | Temp 97.6°F | Resp 14 | Wt 88.1 lb

## 2017-05-30 DIAGNOSIS — E119 Type 2 diabetes mellitus without complications: Secondary | ICD-10-CM | POA: Diagnosis not present

## 2017-05-30 DIAGNOSIS — K912 Postsurgical malabsorption, not elsewhere classified: Secondary | ICD-10-CM

## 2017-05-30 DIAGNOSIS — E7800 Pure hypercholesterolemia, unspecified: Secondary | ICD-10-CM

## 2017-05-30 DIAGNOSIS — R3915 Urgency of urination: Secondary | ICD-10-CM

## 2017-05-30 DIAGNOSIS — M069 Rheumatoid arthritis, unspecified: Secondary | ICD-10-CM

## 2017-05-30 DIAGNOSIS — E559 Vitamin D deficiency, unspecified: Secondary | ICD-10-CM

## 2017-05-30 DIAGNOSIS — D7589 Other specified diseases of blood and blood-forming organs: Secondary | ICD-10-CM | POA: Diagnosis not present

## 2017-05-30 DIAGNOSIS — Z23 Encounter for immunization: Secondary | ICD-10-CM

## 2017-05-30 DIAGNOSIS — L89151 Pressure ulcer of sacral region, stage 1: Secondary | ICD-10-CM | POA: Diagnosis not present

## 2017-05-30 DIAGNOSIS — Z9884 Bariatric surgery status: Secondary | ICD-10-CM

## 2017-05-30 DIAGNOSIS — J8410 Pulmonary fibrosis, unspecified: Secondary | ICD-10-CM | POA: Diagnosis not present

## 2017-05-30 DIAGNOSIS — Z66 Do not resuscitate: Secondary | ICD-10-CM

## 2017-05-30 DIAGNOSIS — E43 Unspecified severe protein-calorie malnutrition: Secondary | ICD-10-CM | POA: Diagnosis not present

## 2017-05-30 HISTORY — DX: Do not resuscitate: Z66

## 2017-05-30 NOTE — Assessment & Plan Note (Signed)
Check A1c; foot exam by MD today 

## 2017-05-30 NOTE — Assessment & Plan Note (Signed)
Check CBC and B12 

## 2017-05-30 NOTE — Assessment & Plan Note (Signed)
Check labs 

## 2017-05-30 NOTE — Assessment & Plan Note (Signed)
Discussion May 30, 2017

## 2017-05-30 NOTE — Assessment & Plan Note (Signed)
Check vit D 

## 2017-05-30 NOTE — Patient Instructions (Addendum)
Ask your dermatologist and rheumatologist if your cancers are related to the medicine you take for your rheumatoid arthritis We'll get labs today and we'll contact you with the results

## 2017-05-30 NOTE — Assessment & Plan Note (Signed)
Consider chocolate as a cause of bladder irritation; offered UA, patient declined

## 2017-05-30 NOTE — Assessment & Plan Note (Signed)
Check lipids; yogurt about 3 hours ago

## 2017-05-30 NOTE — Assessment & Plan Note (Signed)
End-stage, managed by pulmonogist, under care of Hospice

## 2017-05-30 NOTE — Assessment & Plan Note (Signed)
Continue to alleviate pressure

## 2017-05-30 NOTE — Assessment & Plan Note (Signed)
Check protein, albumin; premier supplement for protein

## 2017-05-30 NOTE — Assessment & Plan Note (Signed)
Managed by Dr. Annalee Genta; patient to ask if medicine contributing to the [facility]

## 2017-05-30 NOTE — Progress Notes (Signed)
BP (!) 98/56 (BP Location: Left Arm, Cuff Size: Normal)   Pulse 79   Temp 97.6 F (36.4 C) (Oral)   Resp 14   Wt 88 lb 1.6 oz (40 kg)   SpO2 93%   BMI 17.21 kg/m    Subjective:    Patient ID: Anita Atkins, female    DOB: 22-Oct-1934, 82 y.o.   MRN: [id number]  HPI: Anita Atkins is a 82 y.o. female  Chief Complaint  Patient presents with  . Follow-up    pt would like to check her calcuim    HPI Patient is here for 3 month follow-up She has end-stage pulmonary fibrosis Sees pulmonologist, under the care of Hospice; he is managing the oxycodone at this point, which actually helps her breathe easier; she is only using the Symbicort PRN because it makes her cough; tessalon perles is PRN She has RA; on Imuran She has had several SCC lesions removed from her head and face, upper forehead left of midline, left cheek, right nose, left cheek down by mouth, right lateral cheek, arm No fevers, but having night sweats Heartburn controlled completely Appetite is fair; eating a little better; changes on some days Still has a pressure ulcer on the sacrum, using duoderm; just a scar now where it was says patient; tries to not sit on it wrong Using airline pillow She has problems with urinary incontinence; will just leak; not necessarily with coughing or laughing; no strong smell; no dysuria; no caffeine; does eat chocolate Type 2 diabetes; last A1c was 6; sugars 130 to 170 in the morning; rarely 200 or even 300 for some off the wall reason; gives her the glipizide and it comes down The only time she really leaves the house is for doctor's visit  Depression screen [facility] 2/9 05/30/2017 02/26/2017 11/27/2016 09/26/2016 02/13/2016  Decreased Interest 0 0 0 0 0  Down, Depressed, Hopeless 0 1 0 0 1  PHQ - 2 Score 0 1 0 0 1    Relevant past medical, surgical, family and social history reviewed Past Medical History:  Diagnosis Date  . Diabetes mellitus without complication ([place])   . DNR (do not  resuscitate) 05/30/2017   Discussion May 30, 2017  . High cholesterol   . Monoclonal paraproteinemia 01/31/2016  . Pulmonary fibrosis ([place])   . RA (rheumatoid arthritis) ([place])   . Shingles   . Skin cancer   . Status post bariatric surgery 02/13/2016   Past Surgical History:  Procedure Laterality Date  . CARDIAC SURGERY    . CHOLECYSTECTOMY    . EXPLORATION POST OPERATIVE OPEN HEART    . GASTRIC BYPASS  2007  . MOHS SURGERY     Family History  Problem Relation Age of Onset  . Heart disease Mother   . Stroke Mother   . Diabetes Father   . Diabetes Sister   . Hyperlipidemia Sister   . Heart disease Sister   . Cancer Maternal Aunt        female  . Diabetes Brother   . Hypertension Brother   . Hyperlipidemia Daughter   . Fibromyalgia Daughter   . Stroke Maternal Grandmother   . Arthritis Daughter   . COPD Neg Hx    Social History   Social History  . Marital status: Single    Spouse name: N/A  . Number of children: N/A  . Years of education: N/A   Occupational History  . retired    Social History Main Topics  .  Smoking status: Never Smoker  . Smokeless tobacco: Never Used  . Alcohol use No  . Drug use: No  . Sexual activity: Not Currently   Other Topics Concern  . Not on file   Social History Narrative  . No narrative on file    Interim medical history since last visit reviewed. Allergies and medications reviewed  Review of Systems Per HPI unless specifically indicated above     Objective:    BP (!) 98/56 (BP Location: Left Arm, Cuff Size: Normal)   Pulse 79   Temp 97.6 F (36.4 C) (Oral)   Resp 14   Wt 88 lb 1.6 oz (40 kg)   SpO2 93%   BMI 17.21 kg/m   Wt Readings from Last 3 Encounters:  05/30/17 88 lb 1.6 oz (40 kg)  02/26/17 87 lb 11.2 oz (39.8 kg)  01/08/17 86 lb 9.6 oz (39.3 kg)    Physical Exam  Constitutional: No distress (but appears weak, frail, ill).  Weak and frail, appears chronically ill, seated in wheelchair; very thin  extremities  HENT:  Head: Normocephalic and atraumatic.  Cardiovascular: Normal rate and regular rhythm.   Pulmonary/Chest: Effort normal. No respiratory distress. She has decreased breath sounds.  Abdominal: Soft. She exhibits pulsatile midline mass (very thin, scaphoid abdomen). She exhibits no distension.  Neurological: She is alert. She displays no tremor.  Skin: She is not diaphoretic. No pallor.  Left side upper forehead, covered with bandage (site of prior skin cancer); healing incision / surgical sites on the face, glabella, under left eye, left nasolabial fold, right side of nose, distal right cheek  Psychiatric: She has a normal mood and affect. Her mood appears not anxious. She does not exhibit a depressed mood.  did not appear hopeless or despondent   Diabetic Foot Form - Detailed   Diabetic Foot Exam - detailed Pulse Foot Exam completed.:  Yes  Right Dorsalis Pedis:  Present Left Dorsalis Pedis:  Present  Sensory Foot Exam Completed.:  Yes Semmes-Weinstein Monofilament Test R Site 1-Great Toe:  Pos L Site 1-Great Toe:  Pos          Assessment & Plan:   Problem List Items Addressed This Visit      Respiratory   Pulmonary fibrosis ([place]) - Primary (Chronic)    End-stage, managed by pulmonogist, under care of Hospice        Endocrine   Type 2 diabetes mellitus without complication, without long-term current use of insulin ([facility]) (Chronic)    Check A1c; foot exam by MD today      Relevant Orders   Lipid panel (Completed)   Hemoglobin A1c (Completed)     Musculoskeletal and Integument   Rheumatoid arthritis ([place]) (Chronic)    Managed by Dr. Annalee Genta; patient to ask if medicine contributing to the [facility]      Relevant Orders   COMPLETE METABOLIC PANEL WITH GFR (Completed)   Decubitus ulcer of sacral region, stage 1    Continue to alleviate pressure        Other   Needs flu shot   Relevant Orders   Flu vaccine HIGH DOSE PF (Fluzone High dose) (Completed)     Vitamin D deficiency    Check vit D      Relevant Orders   VITAMIN D 25 Hydroxy (Vit-D Deficiency, Fractures) (Completed)   Urinary urgency    Consider chocolate as a cause of bladder irritation; offered UA, patient declined      Status post bariatric  surgery    Check labs      Relevant Orders   COMPLETE METABOLIC PANEL WITH GFR (Completed)   B12 (Completed)   Protein-calorie malnutrition, severe    Check protein, albumin; premier supplement for protein      Macrocytosis    Check CBC and B12      Relevant Orders   B12 (Completed)   CBC with Differential/Platelet (Completed)   High blood cholesterol (Chronic)    Check lipids; yogurt about 3 hours ago      Relevant Orders   Lipid panel (Completed)   DNR (do not resuscitate)    Discussion May 30, 2017      Relevant Orders   DNR (Do Not Resuscitate)    Other Visit Diagnoses    Malnutrition following gastrointestinal surgery           Follow up plan: Return in about 6 months (around 11/28/2017) for twenty minute follow-up with fasting labs.  An after-visit summary was printed and given to the patient at [place].  Please see the patient instructions which may contain other information and recommendations beyond what is mentioned above in the assessment and plan.  Meds ordered this encounter  Medications  . Cholecalciferol (VITAMIN D3) 5000 units CAPS    Sig: Take 1 capsule by mouth daily.  . B Complex Vitamins (B COMPLEX 1 PO)    Sig: Take by mouth daily.  . vitamin B-12 (CYANOCOBALAMIN) 1000 MCG tablet    Sig: Take 1,000 mcg by mouth daily.    Orders Placed This Encounter  Procedures  . Flu vaccine HIGH DOSE PF (Fluzone High dose)  . Lipid panel  . Hemoglobin A1c  . COMPLETE METABOLIC PANEL WITH GFR  . VITAMIN D 25 Hydroxy (Vit-D Deficiency, Fractures)  . B12  . CBC with Differential/Platelet  . DNR (Do Not Resuscitate)

## 2017-05-31 LAB — COMPLETE METABOLIC PANEL WITH GFR
AG Ratio: 1.3 (calc) (ref 1.0–2.5)
ALT: 18 U/L (ref 6–29)
AST: 32 U/L (ref 10–35)
Albumin: 3.5 g/dL — ABNORMAL LOW (ref 3.6–5.1)
Alkaline phosphatase (APISO): 92 U/L (ref 33–130)
BUN: 21 mg/dL (ref 7–25)
CALCIUM: 9.2 mg/dL (ref 8.6–10.4)
CO2: 29 mmol/L (ref 20–32)
CREATININE: 0.63 mg/dL (ref 0.60–0.88)
Chloride: 101 mmol/L (ref 98–110)
GFR, EST AFRICAN AMERICAN: 98 mL/min/{1.73_m2} (ref >=60)
GFR, EST NON AFRICAN AMERICAN: 85 mL/min/{1.73_m2} (ref >=60)
GLUCOSE: 298 mg/dL — AB (ref 65–139)
Globulin: 2.7 g/dL (calc) (ref 1.9–3.7)
Potassium: 4.7 mmol/L (ref 3.5–5.3)
Sodium: 142 mmol/L (ref 135–146)
TOTAL PROTEIN: 6.2 g/dL (ref 6.1–8.1)
Total Bilirubin: 0.5 mg/dL (ref 0.2–1.2)

## 2017-05-31 LAB — HEMOGLOBIN A1C
EAG (MMOL/L): 7.6 (calc)
Hgb A1c MFr Bld: 6.4 % of total Hgb — ABNORMAL HIGH (ref <5.7)
Mean Plasma Glucose: 137 (calc)

## 2017-05-31 LAB — CBC WITH DIFFERENTIAL/PLATELET
BASOS PCT: 0.6 %
Basophils Absolute: 65 cells/uL (ref 0–200)
EOS PCT: 1.8 %
Eosinophils Absolute: 194 cells/uL (ref 15–500)
HCT: 35.8 % (ref 35.0–45.0)
Hemoglobin: 11.7 g/dL (ref 11.7–15.5)
LYMPHS ABS: 1728 {cells}/uL (ref 850–3900)
MCH: 34.3 pg — ABNORMAL HIGH (ref 27.0–33.0)
MCHC: 32.7 g/dL (ref 32.0–36.0)
MCV: 105 fL — AB (ref 80.0–100.0)
MPV: 12.1 fL (ref 7.5–12.5)
Monocytes Relative: 6.4 %
NEUTROS PCT: 75.2 %
Neutro Abs: 8122 cells/uL — ABNORMAL HIGH (ref 1500–7800)
PLATELETS: 229 10*3/uL (ref 140–400)
RBC: 3.41 10*6/uL — AB (ref 3.80–5.10)
RDW: 12.1 % (ref 11.0–15.0)
TOTAL LYMPHOCYTE: 16 %
WBC: 10.8 10*3/uL (ref 3.8–10.8)
WBCMIX: 691 {cells}/uL (ref 200–950)

## 2017-05-31 LAB — LIPID PANEL
CHOL/HDL RATIO: 4.6 (calc) (ref <5.0)
CHOLESTEROL: 256 mg/dL — AB (ref <200)
HDL: 56 mg/dL (ref >50)
LDL Cholesterol (Calc): 139 mg/dL (calc) — ABNORMAL HIGH
Non-HDL Cholesterol (Calc): 200 mg/dL (calc) — ABNORMAL HIGH (ref <130)
Triglycerides: 399 mg/dL — ABNORMAL HIGH (ref <150)

## 2017-05-31 LAB — VITAMIN B12

## 2017-05-31 LAB — VITAMIN D 25 HYDROXY (VIT D DEFICIENCY, FRACTURES): Vit D, 25-Hydroxy: 43 ng/mL (ref 30–100)

## 2017-06-04 ENCOUNTER — Other Ambulatory Visit: Payer: Self-pay | Admitting: Family Medicine

## 2017-06-04 ENCOUNTER — Other Ambulatory Visit: Payer: Self-pay

## 2017-06-04 DIAGNOSIS — I2510 Atherosclerotic heart disease of native coronary artery without angina pectoris: Secondary | ICD-10-CM

## 2017-06-04 DIAGNOSIS — Z5181 Encounter for therapeutic drug level monitoring: Secondary | ICD-10-CM

## 2017-06-04 DIAGNOSIS — E119 Type 2 diabetes mellitus without complications: Secondary | ICD-10-CM

## 2017-06-04 DIAGNOSIS — E782 Mixed hyperlipidemia: Secondary | ICD-10-CM

## 2017-06-04 MED ORDER — ATORVASTATIN CALCIUM 20 MG PO TABS: 20.0000 mg | ORAL_TABLET | Freq: Every day | ORAL | 2 refills | Status: DC

## 2017-06-05 DIAGNOSIS — R634 Abnormal weight loss: Secondary | ICD-10-CM | POA: Diagnosis not present

## 2017-06-05 DIAGNOSIS — E1142 Type 2 diabetes mellitus with diabetic polyneuropathy: Secondary | ICD-10-CM | POA: Diagnosis not present

## 2017-06-05 DIAGNOSIS — J8410 Pulmonary fibrosis, unspecified: Secondary | ICD-10-CM | POA: Diagnosis not present

## 2017-06-05 DIAGNOSIS — J9611 Chronic respiratory failure with hypoxia: Secondary | ICD-10-CM | POA: Diagnosis not present

## 2017-06-05 DIAGNOSIS — R64 Cachexia: Secondary | ICD-10-CM | POA: Diagnosis not present

## 2017-06-05 DIAGNOSIS — E43 Unspecified severe protein-calorie malnutrition: Secondary | ICD-10-CM | POA: Diagnosis not present

## 2017-06-06 DIAGNOSIS — R64 Cachexia: Secondary | ICD-10-CM | POA: Diagnosis not present

## 2017-06-06 DIAGNOSIS — R634 Abnormal weight loss: Secondary | ICD-10-CM | POA: Diagnosis not present

## 2017-06-06 DIAGNOSIS — J8410 Pulmonary fibrosis, unspecified: Secondary | ICD-10-CM | POA: Diagnosis not present

## 2017-06-06 DIAGNOSIS — E1142 Type 2 diabetes mellitus with diabetic polyneuropathy: Secondary | ICD-10-CM | POA: Diagnosis not present

## 2017-06-06 DIAGNOSIS — J9611 Chronic respiratory failure with hypoxia: Secondary | ICD-10-CM | POA: Diagnosis not present

## 2017-06-06 DIAGNOSIS — E43 Unspecified severe protein-calorie malnutrition: Secondary | ICD-10-CM | POA: Diagnosis not present

## 2017-06-13 DIAGNOSIS — J9611 Chronic respiratory failure with hypoxia: Secondary | ICD-10-CM | POA: Diagnosis not present

## 2017-06-13 DIAGNOSIS — J8410 Pulmonary fibrosis, unspecified: Secondary | ICD-10-CM | POA: Diagnosis not present

## 2017-06-13 DIAGNOSIS — E1142 Type 2 diabetes mellitus with diabetic polyneuropathy: Secondary | ICD-10-CM | POA: Diagnosis not present

## 2017-06-13 DIAGNOSIS — E43 Unspecified severe protein-calorie malnutrition: Secondary | ICD-10-CM | POA: Diagnosis not present

## 2017-06-13 DIAGNOSIS — R64 Cachexia: Secondary | ICD-10-CM | POA: Diagnosis not present

## 2017-06-13 DIAGNOSIS — R634 Abnormal weight loss: Secondary | ICD-10-CM | POA: Diagnosis not present

## 2017-06-18 DIAGNOSIS — S0180XA Unspecified open wound of other part of head, initial encounter: Secondary | ICD-10-CM | POA: Diagnosis not present

## 2017-06-18 DIAGNOSIS — R64 Cachexia: Secondary | ICD-10-CM | POA: Diagnosis not present

## 2017-06-18 DIAGNOSIS — J8410 Pulmonary fibrosis, unspecified: Secondary | ICD-10-CM | POA: Diagnosis not present

## 2017-06-18 DIAGNOSIS — E43 Unspecified severe protein-calorie malnutrition: Secondary | ICD-10-CM | POA: Diagnosis not present

## 2017-06-18 DIAGNOSIS — J9611 Chronic respiratory failure with hypoxia: Secondary | ICD-10-CM | POA: Diagnosis not present

## 2017-06-18 DIAGNOSIS — E1142 Type 2 diabetes mellitus with diabetic polyneuropathy: Secondary | ICD-10-CM | POA: Diagnosis not present

## 2017-06-18 DIAGNOSIS — R634 Abnormal weight loss: Secondary | ICD-10-CM | POA: Diagnosis not present

## 2017-06-19 DIAGNOSIS — E43 Unspecified severe protein-calorie malnutrition: Secondary | ICD-10-CM | POA: Diagnosis not present

## 2017-06-19 DIAGNOSIS — J8410 Pulmonary fibrosis, unspecified: Secondary | ICD-10-CM | POA: Diagnosis not present

## 2017-06-19 DIAGNOSIS — E1142 Type 2 diabetes mellitus with diabetic polyneuropathy: Secondary | ICD-10-CM | POA: Diagnosis not present

## 2017-06-19 DIAGNOSIS — R64 Cachexia: Secondary | ICD-10-CM | POA: Diagnosis not present

## 2017-06-19 DIAGNOSIS — J9611 Chronic respiratory failure with hypoxia: Secondary | ICD-10-CM | POA: Diagnosis not present

## 2017-06-19 DIAGNOSIS — R634 Abnormal weight loss: Secondary | ICD-10-CM | POA: Diagnosis not present

## 2017-06-20 DIAGNOSIS — E43 Unspecified severe protein-calorie malnutrition: Secondary | ICD-10-CM | POA: Diagnosis not present

## 2017-06-20 DIAGNOSIS — R634 Abnormal weight loss: Secondary | ICD-10-CM | POA: Diagnosis not present

## 2017-06-20 DIAGNOSIS — J8410 Pulmonary fibrosis, unspecified: Secondary | ICD-10-CM | POA: Diagnosis not present

## 2017-06-20 DIAGNOSIS — J9611 Chronic respiratory failure with hypoxia: Secondary | ICD-10-CM | POA: Diagnosis not present

## 2017-06-20 DIAGNOSIS — E1142 Type 2 diabetes mellitus with diabetic polyneuropathy: Secondary | ICD-10-CM | POA: Diagnosis not present

## 2017-06-20 DIAGNOSIS — R64 Cachexia: Secondary | ICD-10-CM | POA: Diagnosis not present

## 2017-06-21 DIAGNOSIS — E43 Unspecified severe protein-calorie malnutrition: Secondary | ICD-10-CM | POA: Diagnosis not present

## 2017-06-21 DIAGNOSIS — J8410 Pulmonary fibrosis, unspecified: Secondary | ICD-10-CM | POA: Diagnosis not present

## 2017-06-21 DIAGNOSIS — R64 Cachexia: Secondary | ICD-10-CM | POA: Diagnosis not present

## 2017-06-21 DIAGNOSIS — E1142 Type 2 diabetes mellitus with diabetic polyneuropathy: Secondary | ICD-10-CM | POA: Diagnosis not present

## 2017-06-21 DIAGNOSIS — R634 Abnormal weight loss: Secondary | ICD-10-CM | POA: Diagnosis not present

## 2017-06-21 DIAGNOSIS — J9611 Chronic respiratory failure with hypoxia: Secondary | ICD-10-CM | POA: Diagnosis not present

## 2017-06-25 DIAGNOSIS — L821 Other seborrheic keratosis: Secondary | ICD-10-CM | POA: Diagnosis not present

## 2017-06-25 DIAGNOSIS — L570 Actinic keratosis: Secondary | ICD-10-CM | POA: Diagnosis not present

## 2017-06-25 DIAGNOSIS — D485 Neoplasm of uncertain behavior of skin: Secondary | ICD-10-CM | POA: Diagnosis not present

## 2017-06-25 DIAGNOSIS — Z85828 Personal history of other malignant neoplasm of skin: Secondary | ICD-10-CM | POA: Diagnosis not present

## 2017-06-25 DIAGNOSIS — Z08 Encounter for follow-up examination after completed treatment for malignant neoplasm: Secondary | ICD-10-CM | POA: Diagnosis not present

## 2017-06-25 DIAGNOSIS — X32XXXA Exposure to sunlight, initial encounter: Secondary | ICD-10-CM | POA: Diagnosis not present

## 2017-06-25 DIAGNOSIS — C44329 Squamous cell carcinoma of skin of other parts of face: Secondary | ICD-10-CM | POA: Diagnosis not present

## 2017-06-27 ENCOUNTER — Telehealth: Payer: Self-pay | Admitting: Internal Medicine

## 2017-06-27 DIAGNOSIS — M069 Rheumatoid arthritis, unspecified: Secondary | ICD-10-CM | POA: Diagnosis not present

## 2017-06-27 DIAGNOSIS — F329 Major depressive disorder, single episode, unspecified: Secondary | ICD-10-CM | POA: Diagnosis not present

## 2017-06-27 DIAGNOSIS — I2510 Atherosclerotic heart disease of native coronary artery without angina pectoris: Secondary | ICD-10-CM | POA: Diagnosis not present

## 2017-06-27 DIAGNOSIS — Z4801 Encounter for change or removal of surgical wound dressing: Secondary | ICD-10-CM | POA: Diagnosis not present

## 2017-06-27 DIAGNOSIS — R64 Cachexia: Secondary | ICD-10-CM | POA: Diagnosis not present

## 2017-06-27 DIAGNOSIS — R634 Abnormal weight loss: Secondary | ICD-10-CM | POA: Diagnosis not present

## 2017-06-27 DIAGNOSIS — E559 Vitamin D deficiency, unspecified: Secondary | ICD-10-CM | POA: Diagnosis not present

## 2017-06-27 DIAGNOSIS — E1142 Type 2 diabetes mellitus with diabetic polyneuropathy: Secondary | ICD-10-CM | POA: Diagnosis not present

## 2017-06-27 DIAGNOSIS — J8410 Pulmonary fibrosis, unspecified: Secondary | ICD-10-CM | POA: Diagnosis not present

## 2017-06-27 DIAGNOSIS — Z9884 Bariatric surgery status: Secondary | ICD-10-CM | POA: Diagnosis not present

## 2017-06-27 DIAGNOSIS — L89152 Pressure ulcer of sacral region, stage 2: Secondary | ICD-10-CM | POA: Diagnosis not present

## 2017-06-27 DIAGNOSIS — R3915 Urgency of urination: Secondary | ICD-10-CM | POA: Diagnosis not present

## 2017-06-27 DIAGNOSIS — Z9981 Dependence on supplemental oxygen: Secondary | ICD-10-CM | POA: Diagnosis not present

## 2017-06-27 DIAGNOSIS — D472 Monoclonal gammopathy: Secondary | ICD-10-CM | POA: Diagnosis not present

## 2017-06-27 DIAGNOSIS — C44300 Unspecified malignant neoplasm of skin of unspecified part of face: Secondary | ICD-10-CM | POA: Diagnosis not present

## 2017-06-27 DIAGNOSIS — E7800 Pure hypercholesterolemia, unspecified: Secondary | ICD-10-CM | POA: Diagnosis not present

## 2017-06-27 DIAGNOSIS — L89302 Pressure ulcer of unspecified buttock, stage 2: Secondary | ICD-10-CM | POA: Diagnosis not present

## 2017-06-27 DIAGNOSIS — M810 Age-related osteoporosis without current pathological fracture: Secondary | ICD-10-CM | POA: Diagnosis not present

## 2017-06-27 DIAGNOSIS — E43 Unspecified severe protein-calorie malnutrition: Secondary | ICD-10-CM | POA: Diagnosis not present

## 2017-06-27 DIAGNOSIS — Z681 Body mass index (BMI) 19 or less, adult: Secondary | ICD-10-CM | POA: Diagnosis not present

## 2017-06-27 DIAGNOSIS — J9611 Chronic respiratory failure with hypoxia: Secondary | ICD-10-CM | POA: Diagnosis not present

## 2017-06-27 NOTE — Telephone Encounter (Signed)
DK pt last seen 12/2016. Please advise on message below. DK gave 1 RX with 30 tabs and no refills on 03/22/17.

## 2017-06-27 NOTE — Telephone Encounter (Signed)
PT caretaker Crystal needs to know if we can get a script sent to [facility]  Needs to have Hospice Patient on it It is for PTs oxycodone acetaminophen 5-325 MG 1PRN every 4 to 6 hours  Call back # [phone] and ask for Triage

## 2017-06-28 ENCOUNTER — Other Ambulatory Visit: Payer: Self-pay | Admitting: Internal Medicine

## 2017-06-28 DIAGNOSIS — E1142 Type 2 diabetes mellitus with diabetic polyneuropathy: Secondary | ICD-10-CM | POA: Diagnosis not present

## 2017-06-28 DIAGNOSIS — R64 Cachexia: Secondary | ICD-10-CM | POA: Diagnosis not present

## 2017-06-28 DIAGNOSIS — E43 Unspecified severe protein-calorie malnutrition: Secondary | ICD-10-CM | POA: Diagnosis not present

## 2017-06-28 DIAGNOSIS — J8410 Pulmonary fibrosis, unspecified: Secondary | ICD-10-CM | POA: Diagnosis not present

## 2017-06-28 DIAGNOSIS — R634 Abnormal weight loss: Secondary | ICD-10-CM | POA: Diagnosis not present

## 2017-06-28 DIAGNOSIS — J9611 Chronic respiratory failure with hypoxia: Secondary | ICD-10-CM | POA: Diagnosis not present

## 2017-06-28 MED ORDER — OXYCODONE-ACETAMINOPHEN 5-325 MG PO TABS: 1.0000 | ORAL_TABLET | Freq: Four times a day (QID) | ORAL | 0 refills | Status: DC | PRN

## 2017-06-28 NOTE — Telephone Encounter (Signed)
Refill order placed, will need to be signed and faxed.

## 2017-06-28 NOTE — Telephone Encounter (Signed)
Spoke with Crystal and informed her we are faxing the RX to [phone]. Nothing further needed.

## 2017-07-04 DIAGNOSIS — E1142 Type 2 diabetes mellitus with diabetic polyneuropathy: Secondary | ICD-10-CM | POA: Diagnosis not present

## 2017-07-04 DIAGNOSIS — J9611 Chronic respiratory failure with hypoxia: Secondary | ICD-10-CM | POA: Diagnosis not present

## 2017-07-04 DIAGNOSIS — R634 Abnormal weight loss: Secondary | ICD-10-CM | POA: Diagnosis not present

## 2017-07-04 DIAGNOSIS — J8410 Pulmonary fibrosis, unspecified: Secondary | ICD-10-CM | POA: Diagnosis not present

## 2017-07-04 DIAGNOSIS — E43 Unspecified severe protein-calorie malnutrition: Secondary | ICD-10-CM | POA: Diagnosis not present

## 2017-07-04 DIAGNOSIS — R64 Cachexia: Secondary | ICD-10-CM | POA: Diagnosis not present

## 2017-07-09 ENCOUNTER — Telehealth: Payer: Self-pay | Admitting: Family Medicine

## 2017-07-09 MED ORDER — CEPHALEXIN 500 MG PO CAPS: 500.0000 mg | ORAL_CAPSULE | Freq: Two times a day (BID) | ORAL | 0 refills | Status: AC

## 2017-07-09 NOTE — Telephone Encounter (Signed)
Copied from [place]. Topic: Inquiry >> Jul 09, 2017 12:07 PM Neva Seat wrote: [facility] [place] [phone]  Daughter of patient called - her big toe is red,   Has been soaking toe and neosporin is not helping.   Colletta Maryland thinks it is an infection.  Needs an antibiotic called in for pt at [facility] in [place]

## 2017-07-09 NOTE — Telephone Encounter (Signed)
Tilda Franco ,RN with hospice. No answer. LM informing her of rx sent in. To call back for any questions or concerns. CRM created.

## 2017-07-09 NOTE — Telephone Encounter (Signed)
Please advise 

## 2017-07-09 NOTE — Telephone Encounter (Signed)
[facility] nurse is calling to request an antibiotic for patient. She was out last week and the left great toe was slightly red. They have been soaking and using neosporin. Since then the daughter reports that the whole toe has become red and tender. Patient can't use sock on the foot. They are going to try to get a podiatrist to see her- but would like an antibiotic called in in the meantime- as they are not sure how long it is going to take to get her evaluated. She uses [facility] and if it is ok- please let Hospice know so that they can cover that medication.  Colletta [place], [place]

## 2017-07-09 NOTE — Telephone Encounter (Signed)
Please let them know I sent in Rx for Keflex; thank you

## 2017-07-10 DIAGNOSIS — J9611 Chronic respiratory failure with hypoxia: Secondary | ICD-10-CM | POA: Diagnosis not present

## 2017-07-10 DIAGNOSIS — J8410 Pulmonary fibrosis, unspecified: Secondary | ICD-10-CM | POA: Diagnosis not present

## 2017-07-10 DIAGNOSIS — E43 Unspecified severe protein-calorie malnutrition: Secondary | ICD-10-CM | POA: Diagnosis not present

## 2017-07-10 DIAGNOSIS — E1142 Type 2 diabetes mellitus with diabetic polyneuropathy: Secondary | ICD-10-CM | POA: Diagnosis not present

## 2017-07-10 DIAGNOSIS — R64 Cachexia: Secondary | ICD-10-CM | POA: Diagnosis not present

## 2017-07-10 DIAGNOSIS — R634 Abnormal weight loss: Secondary | ICD-10-CM | POA: Diagnosis not present

## 2017-07-11 DIAGNOSIS — R634 Abnormal weight loss: Secondary | ICD-10-CM | POA: Diagnosis not present

## 2017-07-11 DIAGNOSIS — M79674 Pain in right toe(s): Secondary | ICD-10-CM | POA: Diagnosis not present

## 2017-07-11 DIAGNOSIS — J9611 Chronic respiratory failure with hypoxia: Secondary | ICD-10-CM | POA: Diagnosis not present

## 2017-07-11 DIAGNOSIS — E43 Unspecified severe protein-calorie malnutrition: Secondary | ICD-10-CM | POA: Diagnosis not present

## 2017-07-11 DIAGNOSIS — E1142 Type 2 diabetes mellitus with diabetic polyneuropathy: Secondary | ICD-10-CM | POA: Diagnosis not present

## 2017-07-11 DIAGNOSIS — L600 Ingrowing nail: Secondary | ICD-10-CM | POA: Diagnosis not present

## 2017-07-11 DIAGNOSIS — B351 Tinea unguium: Secondary | ICD-10-CM | POA: Diagnosis not present

## 2017-07-11 DIAGNOSIS — R64 Cachexia: Secondary | ICD-10-CM | POA: Diagnosis not present

## 2017-07-11 DIAGNOSIS — J8410 Pulmonary fibrosis, unspecified: Secondary | ICD-10-CM | POA: Diagnosis not present

## 2017-07-11 DIAGNOSIS — M79675 Pain in left toe(s): Secondary | ICD-10-CM | POA: Diagnosis not present

## 2017-07-16 DIAGNOSIS — J9611 Chronic respiratory failure with hypoxia: Secondary | ICD-10-CM | POA: Diagnosis not present

## 2017-07-16 DIAGNOSIS — J8410 Pulmonary fibrosis, unspecified: Secondary | ICD-10-CM | POA: Diagnosis not present

## 2017-07-16 DIAGNOSIS — E1142 Type 2 diabetes mellitus with diabetic polyneuropathy: Secondary | ICD-10-CM | POA: Diagnosis not present

## 2017-07-16 DIAGNOSIS — E43 Unspecified severe protein-calorie malnutrition: Secondary | ICD-10-CM | POA: Diagnosis not present

## 2017-07-16 DIAGNOSIS — L03032 Cellulitis of left toe: Secondary | ICD-10-CM | POA: Diagnosis not present

## 2017-07-16 DIAGNOSIS — R64 Cachexia: Secondary | ICD-10-CM | POA: Diagnosis not present

## 2017-07-16 DIAGNOSIS — L02612 Cutaneous abscess of left foot: Secondary | ICD-10-CM | POA: Diagnosis not present

## 2017-07-16 DIAGNOSIS — R634 Abnormal weight loss: Secondary | ICD-10-CM | POA: Diagnosis not present

## 2017-07-25 ENCOUNTER — Other Ambulatory Visit: Payer: Self-pay | Admitting: Family Medicine

## 2017-07-25 DIAGNOSIS — E1142 Type 2 diabetes mellitus with diabetic polyneuropathy: Secondary | ICD-10-CM | POA: Diagnosis not present

## 2017-07-25 DIAGNOSIS — K219 Gastro-esophageal reflux disease without esophagitis: Secondary | ICD-10-CM

## 2017-07-25 DIAGNOSIS — J8410 Pulmonary fibrosis, unspecified: Secondary | ICD-10-CM | POA: Diagnosis not present

## 2017-07-25 DIAGNOSIS — E43 Unspecified severe protein-calorie malnutrition: Secondary | ICD-10-CM | POA: Diagnosis not present

## 2017-07-25 DIAGNOSIS — R64 Cachexia: Secondary | ICD-10-CM | POA: Diagnosis not present

## 2017-07-25 DIAGNOSIS — R634 Abnormal weight loss: Secondary | ICD-10-CM | POA: Diagnosis not present

## 2017-07-25 DIAGNOSIS — J9611 Chronic respiratory failure with hypoxia: Secondary | ICD-10-CM | POA: Diagnosis not present

## 2017-08-05 ENCOUNTER — Other Ambulatory Visit: Payer: Self-pay | Admitting: Family Medicine

## 2017-08-13 DIAGNOSIS — D485 Neoplasm of uncertain behavior of skin: Secondary | ICD-10-CM | POA: Diagnosis not present

## 2017-08-13 DIAGNOSIS — C44329 Squamous cell carcinoma of skin of other parts of face: Secondary | ICD-10-CM | POA: Diagnosis not present

## 2017-08-13 DIAGNOSIS — B079 Viral wart, unspecified: Secondary | ICD-10-CM | POA: Diagnosis not present

## 2017-08-15 ENCOUNTER — Other Ambulatory Visit: Payer: Self-pay | Admitting: Family Medicine

## 2017-08-15 NOTE — Telephone Encounter (Signed)
Contacted daughter she states since in hospice it is hard for her to get out.  But she will see if the hospice nurse can draw the blood.

## 2017-08-15 NOTE — Telephone Encounter (Signed)
Please see if patient is interested in having her labs rechecked With her overall health, I'm okay to just continue the medicine without labs to save her a stick if she would like that If she interested in knowing her numbers and wouldn't mind the stick, then okay for blood work It's completely up to her I'll send in the refills

## 2017-08-22 ENCOUNTER — Telehealth: Payer: Self-pay | Admitting: Internal Medicine

## 2017-08-22 NOTE — Telephone Encounter (Signed)
Daughter wants to clarify if patient needs to fu in office since hospice nurse is coming to see her at home   Please advise

## 2017-08-24 NOTE — Telephone Encounter (Signed)
Only if she wants to talk, not required if they feel comfortable

## 2017-08-26 NOTE — Telephone Encounter (Signed)
Detailed message left per Dr. Mortimer Fries on daughter's cell number as stated in previous message. Nothing further needed.

## 2017-09-03 ENCOUNTER — Encounter: Payer: Self-pay | Admitting: Family Medicine

## 2017-09-09 ENCOUNTER — Encounter: Payer: Self-pay | Admitting: Family Medicine

## 2017-09-10 DIAGNOSIS — D485 Neoplasm of uncertain behavior of skin: Secondary | ICD-10-CM | POA: Diagnosis not present

## 2017-09-10 DIAGNOSIS — C44329 Squamous cell carcinoma of skin of other parts of face: Secondary | ICD-10-CM | POA: Diagnosis not present

## 2017-09-11 ENCOUNTER — Encounter: Payer: Self-pay | Admitting: Family Medicine

## 2017-09-13 ENCOUNTER — Telehealth: Payer: Self-pay | Admitting: Family Medicine

## 2017-09-13 ENCOUNTER — Telehealth: Payer: Self-pay

## 2017-09-13 NOTE — Telephone Encounter (Signed)
Copied from [place] [id number]. Topic: General - Other >> Sep 13, 2017  9:42 AM Carolyn Stare wrote:  Colletta Maryland with hospice would like call back  family is asking fo rlabs to be drawn   [phone]

## 2017-09-13 NOTE — Telephone Encounter (Signed)
I spoke with Mariann Laster who was so very helpful; she says they work 24 hours a day and will run these labs stat and have their nurse practitioner go over the labs for the family I explained I'll be unavailable to review the labs with them Back pain will be our diagnosis I thanked her for her work and attention to the needs of Ms.Butz's family

## 2017-09-13 NOTE — Telephone Encounter (Signed)
I talked with the daughter again, explained above

## 2017-09-13 NOTE — Telephone Encounter (Signed)
Anita Atkins, pt's care manager calls and states that she is wondering if you can order labs to access this pt's kidney function at the request of the patient's daughter. Daughter is planning to be out of town for the next 10 days and doesn't want to leave if mother may "pass away" Anita Atkins has explained to the daughter that the labs will not help determine the patient's end of life, but told daughter she would call so she did. Please advise. Thanks!

## 2017-09-13 NOTE — Telephone Encounter (Signed)
Niesha's daughter Tora Duck is leaving for [place] this weekend Patient has been on antibiotic for bladder infection Having some pain Wanting to check her kidney infection, just for information On-call, [phone], CMP I'll be unavailable so contact on-call doctor and have hospice Offered anything else I can do to address her needs; she thinks she is set Her toe is bothering her and Dr. Cleda Mccreedy remove the toenail, soaking it and looking great, now it's getting pus on the side; soaking it again, little bit of soreness; Dr. Cleda Mccreedy said the next step is xray to check for infection, but nothing could be done; she could not be put to sleep for toe amputation ------------------------------- I called on-call number Explained that family member is eager to check before departing for [place], really would like this information before leaving if there is any way it can be done They are having on-call nurse paged --------------------------------

## 2017-09-13 NOTE — Telephone Encounter (Signed)
Called [place], no answer. LM for [facility] Case Manager to call back.

## 2017-09-20 NOTE — Telephone Encounter (Signed)
Recall deleted  

## 2017-09-26 ENCOUNTER — Telehealth: Payer: Self-pay | Admitting: Family Medicine

## 2017-09-26 MED ORDER — CEPHALEXIN 250 MG PO CAPS: 250.0000 mg | ORAL_CAPSULE | Freq: Three times a day (TID) | ORAL | 0 refills | Status: DC

## 2017-09-26 MED ORDER — MUPIROCIN 2 % EX OINT: 1.0000 "application " | TOPICAL_OINTMENT | Freq: Two times a day (BID) | CUTANEOUS | 0 refills | Status: AC

## 2017-09-26 NOTE — Telephone Encounter (Signed)
Anita Atkins at hospice informed her of the information below. She gave verbal understanding.

## 2017-09-26 NOTE — Addendum Note (Signed)
Addended by: Makynzi Eastland, Satira Anis on: 09/26/2017 03:08 PM   Modules accepted: Orders

## 2017-09-26 NOTE — Telephone Encounter (Signed)
I sent in new medicines to pharmacy Keflex has risk of C diff, so make sure patient taking probiotic daily for the next month Call if worsening, or take to urgent care or ER if drainage needed

## 2017-09-26 NOTE — Telephone Encounter (Signed)
Copied from [place] [phone]. Topic: Inquiry >> Sep 26, 2017 11:53 AM Patrice Paradise wrote: Reason for CRM: Colletta Maryland with hospice would like a call back @ [phone]. The patient big toe on her left foot badly infected, red w/pus where the toe nail was. Colletta Maryland would like to know it Dr. Sanda Klein could call in antibiotic for the toe and a stronger antibiotic ointment to put on the toe. She is requesting a call back from Dr. Sanda Klein assistant.

## 2017-09-26 NOTE — Telephone Encounter (Signed)
Please advise. Thanks.  

## 2017-10-03 ENCOUNTER — Telehealth: Payer: Self-pay

## 2017-10-03 MED ORDER — CEPHALEXIN 250 MG PO CAPS: 250.0000 mg | ORAL_CAPSULE | Freq: Three times a day (TID) | ORAL | 0 refills | Status: DC

## 2017-10-03 NOTE — Telephone Encounter (Signed)
Copied from [place] [id number]. Topic: Quick Communication - See Telephone Encounter >> Oct 03, 2017  3:36 PM Oneta Rack wrote: CRM for notification. See Telephone encounter for:   10/03/17.  Caller name: Colletta Maryland  Relation to pt: Case [place]  Call back number: [phone]  Pharmacy:  Reason for call:  Requesting verbal orders to extend another round of antibiotics for toe infection due to infection improving slowly, please advise Toe infection extededd antibotics patient symtopms have imorived toe infection slowly improved, please advise

## 2017-10-03 NOTE — Telephone Encounter (Signed)
Okay for additional five days of antibiotics Rx sent to local pharmacy

## 2017-10-04 NOTE — Telephone Encounter (Signed)
Called [name] from hospice, no answer. LM for her informing her about the antibiotics. To call back for questions or concerns.

## 2017-10-08 ENCOUNTER — Telehealth: Payer: Self-pay | Admitting: Family Medicine

## 2017-10-08 NOTE — Telephone Encounter (Signed)
Copied from [place]. Topic: Quick Communication - See Telephone Encounter >> Oct 08, 2017  3:11 PM Vernona Rieger wrote: CRM for notification. See Telephone encounter for:   10/08/17.  Stephanie from Hospice called and stated she has a left toe infection (still going on) She wants a nurse to call her back  [phone] call after 4:30. Or in the morning

## 2017-10-09 MED ORDER — DOXYCYCLINE HYCLATE 100 MG PO TABS: 100.0000 mg | ORAL_TABLET | Freq: Two times a day (BID) | ORAL | 0 refills | Status: DC

## 2017-10-09 NOTE — Telephone Encounter (Signed)
Called pts daughter she states it is so hard for her to get out of the house, she fell recently and also wants to try to keep her away from doctors offices due to her lung disease and her getting sick? Please advise?

## 2017-10-09 NOTE — Telephone Encounter (Signed)
I understand I will call Thank you for contacting them

## 2017-10-09 NOTE — Telephone Encounter (Signed)
She's going to need to be seen; either urgent care, ER, or our office (but I am out) Thank you

## 2017-10-09 NOTE — Telephone Encounter (Signed)
I called, left a detailed message Understand difficulty in getting her out Just hoped for an evaluation by an MD to make sure truly cellulitis and not other condition (vasculitis, ischemia, gout, etc.) Since I can't reach them and I'm not in the office today, I'll send in new ABX We have to be very watchful, though, as ABX can cause C diff which can be fatal; that's why I wanted the evaluation instead of just sending in more ABX New medicine sent to pharmacy Contact staff if anything we can do

## 2017-10-15 ENCOUNTER — Telehealth: Payer: Self-pay | Admitting: Internal Medicine

## 2017-10-15 ENCOUNTER — Other Ambulatory Visit
Admission: RE | Admit: 2017-10-15 | Discharge: 2017-10-15 | Disposition: A | Discharge status: Home / Self Care | Source: Ambulatory Visit | Attending: Podiatry | Admitting: Podiatry

## 2017-10-15 DIAGNOSIS — M79672 Pain in left foot: Secondary | ICD-10-CM | POA: Diagnosis not present

## 2017-10-15 DIAGNOSIS — L02612 Cutaneous abscess of left foot: Secondary | ICD-10-CM | POA: Insufficient documentation

## 2017-10-15 DIAGNOSIS — L03119 Cellulitis of unspecified part of limb: Secondary | ICD-10-CM | POA: Diagnosis not present

## 2017-10-15 DIAGNOSIS — M79671 Pain in right foot: Secondary | ICD-10-CM | POA: Diagnosis not present

## 2017-10-15 DIAGNOSIS — M86171 Other acute osteomyelitis, right ankle and foot: Secondary | ICD-10-CM | POA: Diagnosis not present

## 2017-10-15 DIAGNOSIS — E119 Type 2 diabetes mellitus without complications: Secondary | ICD-10-CM | POA: Diagnosis not present

## 2017-10-15 DIAGNOSIS — L97522 Non-pressure chronic ulcer of other part of left foot with fat layer exposed: Secondary | ICD-10-CM | POA: Diagnosis not present

## 2017-10-15 DIAGNOSIS — M86172 Other acute osteomyelitis, left ankle and foot: Secondary | ICD-10-CM | POA: Diagnosis not present

## 2017-10-15 DIAGNOSIS — L03032 Cellulitis of left toe: Secondary | ICD-10-CM | POA: Diagnosis not present

## 2017-10-15 DIAGNOSIS — L02619 Cutaneous abscess of unspecified foot: Secondary | ICD-10-CM | POA: Diagnosis not present

## 2017-10-15 DIAGNOSIS — M868X7 Other osteomyelitis, ankle and foot: Secondary | ICD-10-CM | POA: Insufficient documentation

## 2017-10-15 NOTE — Telephone Encounter (Signed)
Pt last seen 01/08/17. Daughter has called stating pt needs toe surgery and wants to discuss lung condition. Do you want to call her or do you want to bring her in for surgical clearance?

## 2017-10-15 NOTE — Telephone Encounter (Signed)
Her lung function is very poor, high risk for complications She can see me if she would like to discuss further

## 2017-10-15 NOTE — Telephone Encounter (Signed)
Pt daughter states pt needs toe surgery. Please call to discuss pt lung condition.

## 2017-10-16 ENCOUNTER — Telehealth: Payer: Self-pay | Admitting: Family Medicine

## 2017-10-16 NOTE — Telephone Encounter (Signed)
Patient's daughter Dorian Pod called, left VM to return call to office to give update.

## 2017-10-16 NOTE — Telephone Encounter (Signed)
Left message for pt's daughter, Dorian Pod to return call regarding status of mother's toe.

## 2017-10-16 NOTE — Telephone Encounter (Signed)
Copied from [place] [phone]. Topic: General - Other >> Oct 16, 2017 10:44 AM Cecelia Byars, NT wrote: Reason for CRM: Patient daughter Laurier Nancy  called and would like to give an update on the status of the patient  concerning her toe , please call her at  [phone]

## 2017-10-16 NOTE — Telephone Encounter (Signed)
Patient daughter is returning call. Can be reached at [phone]

## 2017-10-16 NOTE — Telephone Encounter (Signed)
LMOM for daughter to return call. 

## 2017-10-17 ENCOUNTER — Telehealth: Payer: Self-pay | Admitting: Family Medicine

## 2017-10-17 NOTE — Telephone Encounter (Signed)
Copied from [place] [id number]. Topic: Quick Communication - See Telephone Encounter >> Oct 17, 2017  9:55 AM Hewitt Shorts wrote: CRM for notification. See Telephone encounter for:  Seth Bake from hospice is calling to make sure that Dr. Sanda Klein  is aware of possible amputation of two toes and they family has questions regarding this   Best number Seth Bake -[phone] daughter [phone] 10/17/17.

## 2017-10-17 NOTE — Telephone Encounter (Signed)
I returned the call to first number; spoke with Seth Bake, case nurse manager She went Tuesday to someone about a toe that was bothering her; Dr. Cleda Mccreedy managed this, osteomyelitis, right worse than left; she will send me his note Daughter wanting to understand all of this I will defer questions and expectations to the surgeon, as I can't give much direction; she is not septic yet; causing her a lot of pain I encouraged them to call the podiatrist with questions about the amputation I do appreciate the information about her condition Using oxycodone for pain She'll fax the note over from 10/15/17 ---------------------------------------------------- I called and left Dorian Pod a detailed message Please contact surgeon and call me with any questions

## 2017-10-17 NOTE — Telephone Encounter (Signed)
I called and left message to pull me from a room Daughter called back Discussed pros and cons She could do general anesthesia Wounds heal very slowly Consider consultations with ID

## 2017-10-17 NOTE — Telephone Encounter (Signed)
Informed daughter of response from [place]. She states she figured that would be it and that she will inform her mother who needs surgery. Nothing further needed.

## 2017-10-17 NOTE — Telephone Encounter (Signed)
Pt's daughter calling back to speak with Dr. Sanda Klein.

## 2017-10-21 ENCOUNTER — Encounter: Payer: Self-pay | Admitting: Family Medicine

## 2017-10-21 LAB — AEROBIC/ANAEROBIC CULTURE W GRAM STAIN (SURGICAL/DEEP WOUND)

## 2017-10-21 LAB — AEROBIC/ANAEROBIC CULTURE (SURGICAL/DEEP WOUND)

## 2017-10-31 DIAGNOSIS — M86172 Other acute osteomyelitis, left ankle and foot: Secondary | ICD-10-CM | POA: Diagnosis not present

## 2017-11-05 DIAGNOSIS — C44329 Squamous cell carcinoma of skin of other parts of face: Secondary | ICD-10-CM | POA: Diagnosis not present

## 2017-11-08 ENCOUNTER — Telehealth: Payer: Self-pay | Admitting: Internal Medicine

## 2017-11-08 MED ORDER — OXYCODONE-ACETAMINOPHEN 5-325 MG PO TABS
1.0000 | ORAL_TABLET | Freq: Four times a day (QID) | ORAL | 0 refills | Status: AC | PRN
Start: 2017-11-08 — End: ?

## 2017-11-08 NOTE — Telephone Encounter (Signed)
Left Stephanie nurse for hospice a message notifying that rx Oxycodone will not be ready until Monday as Dr. Mortimer Fries is not in clinic to sign. Rx will be printed and placed in MD folder for signature.

## 2017-11-08 NOTE — Telephone Encounter (Signed)
°*  STAT* If patient is at the pharmacy, call can be transferred to refill team.   1. Which medications need to be refilled? (please list name of each medication and dose if known)  Oxycondone 5-325 mg   2. Which pharmacy/location (including street and city if local pharmacy) is medication to be sent to? [facility] [address]   3. Do they need a 30 day or 90 day supply? 90 day

## 2017-11-11 ENCOUNTER — Telehealth: Payer: Self-pay | Admitting: Family Medicine

## 2017-11-11 DIAGNOSIS — B372 Candidiasis of skin and nail: Secondary | ICD-10-CM

## 2017-11-11 MED ORDER — FLUCONAZOLE 150 MG PO TABS: 150.0000 mg | ORAL_TABLET | Freq: Once | ORAL | 0 refills | Status: AC

## 2017-11-11 NOTE — Telephone Encounter (Signed)
Called [place], no answer. After hours will call back tomorrow.

## 2017-11-11 NOTE — Telephone Encounter (Signed)
Copied from [place]. Topic: Quick Communication - See Telephone Encounter >> Nov 11, 2017  9:14 AM Robina Ade, Helene Kelp D wrote: CRM for notification. See Telephone encounter for: 11/11/17. Stephanie with Hospice called and said that patient has a yeast infection and want to know if Dr. Sanda Klein can called pt in Diflucan to [facility] on Sun. Because this helped patient out last time she had it, if something else is called in to please call her and let her know. She can be reached at [phone], thanks.

## 2017-11-11 NOTE — Telephone Encounter (Signed)
Please advise. Thanks.  

## 2017-11-11 NOTE — Telephone Encounter (Signed)
Returned call to [place] and made aware Rx was being sent this morning. Also, made aware that Dr. Mortimer Fries will not be writing another rx for pain med. She will send email to hospice physician making aware to take over pain rx.

## 2017-11-11 NOTE — Telephone Encounter (Signed)
Sure I sent in the Rx for diflucan The patient should NOT take her cholesterol medicine, atorvastatin, for 3 days after taking the diflucan Thank you

## 2017-11-12 NOTE — Telephone Encounter (Signed)
Called [place] w/ Hospice no answer. LM for Colletta Maryland informing her that rx had been sent in.

## 2017-11-20 ENCOUNTER — Telehealth: Payer: Self-pay | Admitting: Family Medicine

## 2017-11-20 NOTE — Telephone Encounter (Signed)
Copied from [place] [id number]. Topic: Quick Communication - See Telephone Encounter >> Nov 20, 2017  3:48 PM Ivar Drape wrote: CRM for notification. See Telephone encounter for: 11/20/17. Colletta Maryland w/Hospice [place] [phone] needs an order to check the patient for CDiff.  Leave a message on answering machine if no answer.

## 2017-11-21 NOTE — Telephone Encounter (Signed)
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 Can call this number to reach someone about this matter: [phone].

## 2017-11-21 NOTE — Telephone Encounter (Signed)
Called and spoke with Colletta Maryland in order to give verbal order. She states that the pt had diarrhea for two days. They did not give the pt anything for the diarrhea but it has stopped and pt has not had a BM today. Colletta Maryland states that they no longer need the order.

## 2017-11-21 NOTE — Telephone Encounter (Signed)
That's fine; I'm assuming that she has diarrhea

## 2017-11-28 ENCOUNTER — Ambulatory Visit: Payer: Medicare HMO | Admitting: Family Medicine

## 2017-12-05 DIAGNOSIS — L97522 Non-pressure chronic ulcer of other part of left foot with fat layer exposed: Secondary | ICD-10-CM | POA: Diagnosis not present

## 2017-12-05 DIAGNOSIS — M86172 Other acute osteomyelitis, left ankle and foot: Secondary | ICD-10-CM | POA: Diagnosis not present

## 2017-12-20 ENCOUNTER — Other Ambulatory Visit: Payer: Self-pay | Admitting: Family Medicine

## 2017-12-20 ENCOUNTER — Telehealth: Payer: Self-pay | Admitting: Family Medicine

## 2017-12-20 NOTE — Telephone Encounter (Signed)
Where is the yeast infection? Under breasts? Groin? Vaginal?

## 2017-12-20 NOTE — Telephone Encounter (Signed)
Called Stephanie at home health, she states that the yeast is on the buttocks, and tailbone.

## 2017-12-20 NOTE — Telephone Encounter (Signed)
Anita Atkins with home health informed her of the information below.

## 2017-12-20 NOTE — Telephone Encounter (Signed)
The clotrimazole cream is fine to try; if not improving after 5-7 days, okay to switch to nystatin cream apply BID to TID

## 2017-12-20 NOTE — Telephone Encounter (Signed)
Copied from [place] [phone] [phone]. Topic: Quick Communication - See Telephone Encounter >> Dec 20, 2017  9:46 AM Conception Chancy, NT wrote: CRM for notification. See Telephone encounter for: 12/20/17.  Colletta Maryland is calling from [facility] and states she believes patient has a yeast infection again. She states everything they have used is not working. She states the patient had clotrimazole cream at home so she started using that yesterday. Colletta Maryland would like to know is that okay for pt use or does Dr. Sanda Klein want to call the patient something else in. Please advise.   [facility] [address], [place] [address] Phone: [phone] Fax: [phone]

## 2017-12-30 ENCOUNTER — Other Ambulatory Visit: Payer: Self-pay | Admitting: Cardiovascular Disease

## 2018-01-16 ENCOUNTER — Telehealth: Payer: Self-pay | Admitting: Family Medicine

## 2018-01-16 NOTE — Telephone Encounter (Signed)
Copied from [place]. Topic: Quick Communication - See Telephone Encounter >> Jan 16, 2018  3:18 PM Robina Ade, Helene Kelp D wrote: CRM for notification. See Telephone encounter for: 01/16/18. Stephanie with [facility] called and would like to talk to Dr. Sanda Klein CMA about patient pain control. Please call her back [phone].

## 2018-01-16 NOTE — Telephone Encounter (Signed)
I spoke with Hospice nurse Okay to clotrimazole BID PRN to affected skin, 30g +1 refills Pain control discussed; using short-acting; they would like long-acting and will contact Hospice director for prescription, per my request No other needs

## 2018-02-09 ENCOUNTER — Other Ambulatory Visit: Payer: Self-pay | Admitting: Cardiovascular Disease

## 2018-02-17 ENCOUNTER — Other Ambulatory Visit: Payer: Self-pay | Admitting: Cardiovascular Disease

## 2018-02-20 ENCOUNTER — Other Ambulatory Visit: Payer: Self-pay | Admitting: Family Medicine

## 2018-02-20 ENCOUNTER — Other Ambulatory Visit: Payer: Self-pay | Admitting: Cardiovascular Disease

## 2018-02-28 ENCOUNTER — Other Ambulatory Visit: Payer: Self-pay | Admitting: Family Medicine

## 2018-02-28 MED ORDER — METOPROLOL TARTRATE 25 MG PO TABS: 25.0000 mg | ORAL_TABLET | Freq: Two times a day (BID) | ORAL | 2 refills | Status: AC

## 2018-02-28 NOTE — Telephone Encounter (Signed)
I approved refills Please let them know we're thinking about her

## 2018-02-28 NOTE — Telephone Encounter (Signed)
Refill request was sent to Dr. Melinda Lada for approval and submission.  

## 2018-02-28 NOTE — Telephone Encounter (Signed)
Copied from [place] [phone]. Topic: Quick Communication - See Telephone Encounter >> Feb 28, 2018  9:19 AM Mylinda Latina, NT wrote: CRM for notification. See Telephone encounter for: 02/28/18. Stephanie calling from [facility] called and is requesting a refill of metoprolol tartrate (LOPRESSOR) 25 MG tablet. She knows that Dr. Sanda Klein didn't prescirbed this med. She states the daughter has tried to call the cardiologist who order this to refill with no success. Colletta Maryland is wondering if Dr. Sanda Klein will refill or what she wants to do. If you have any questions you can call [facility] CB# [place] [address] [place] [phone] (Phone) [phone] (Fax)

## 2018-03-14 IMAGING — CT CT ANGIO CHEST
2 of 6 series · 18 of 46 positions shown · IV contrast (APPLIED)
Comparison: High-resolution chest CT 05/04/2016

CLINICAL DATA: Shortness of breath, cough, and bilateral rib pain.
History of pulmonary fibrosis.

EXAM:
CT ANGIOGRAPHY CHEST WITH CONTRAST
TECHNIQUE: Multidetector CT imaging of the chest was performed using the
standard protocol during bolus administration of intravenous
contrast. Multiplanar CT image reconstructions and MIPs were
obtained to evaluate the vascular anatomy.
CONTRAST:  75 mL Isovue 370

[Series 5: thins · axial · 0.59mm/px · z∈[-281,-40]mm · 16 of 262 slices shown]
[im 12/262  lung]
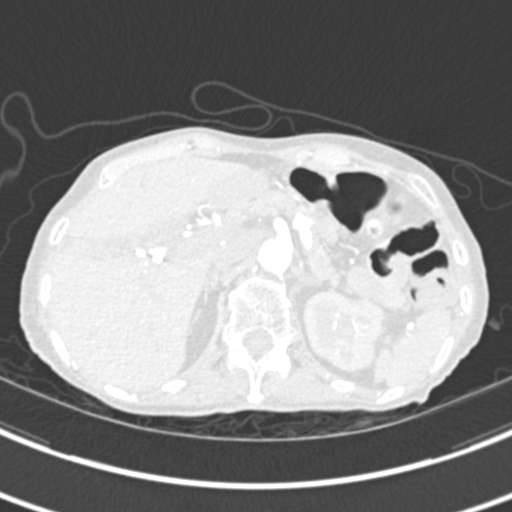
[im 35/262  soft-tissue]
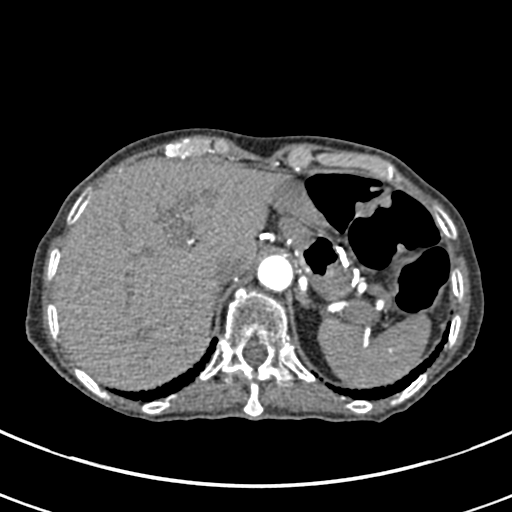
[im 46/262  lung]
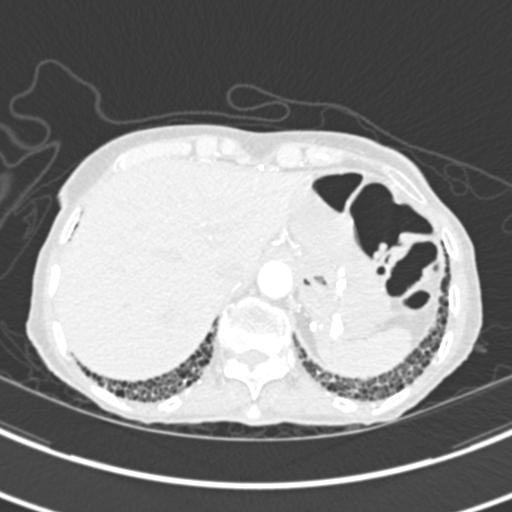
[im 57/262  soft-tissue]
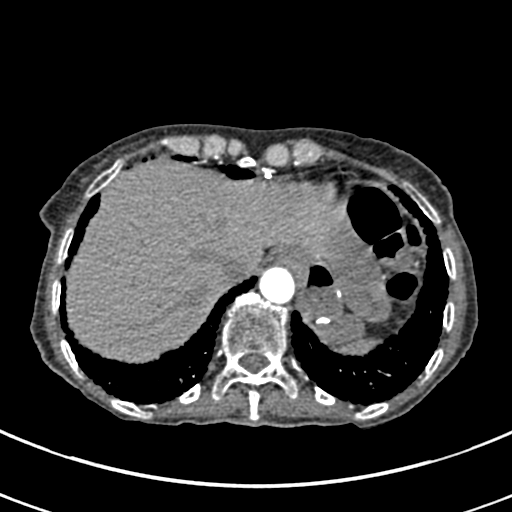
[im 80/262  lung]
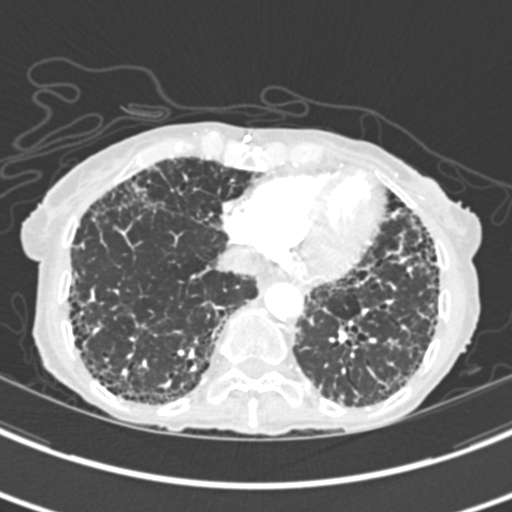
[im 91/262  soft-tissue]
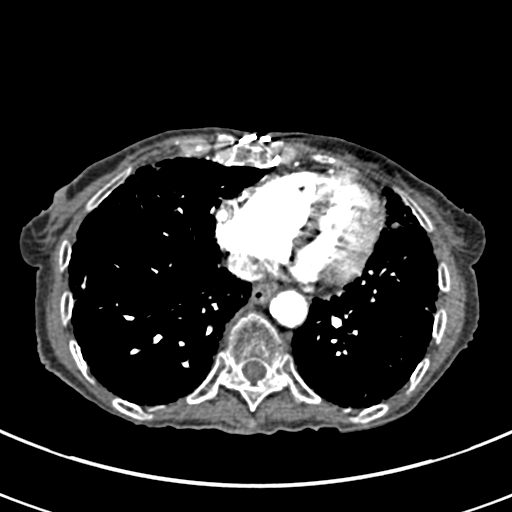
[im 103/262  lung]
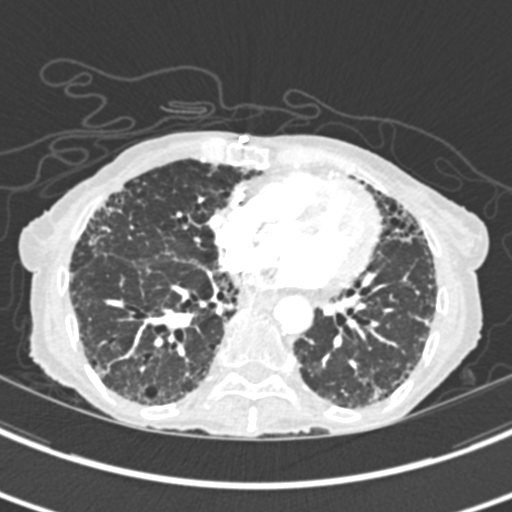
[im 125/262  soft-tissue]
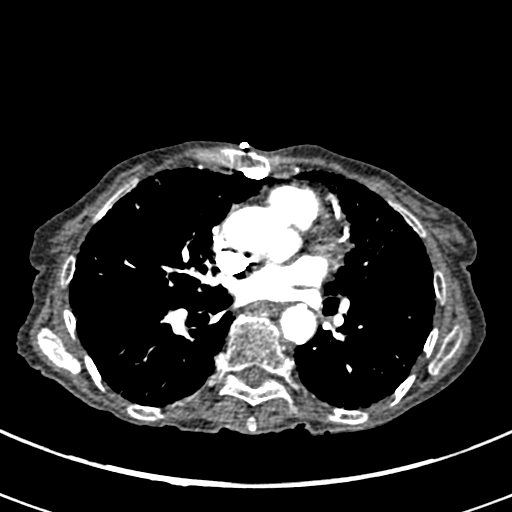
[im 137/262  lung]
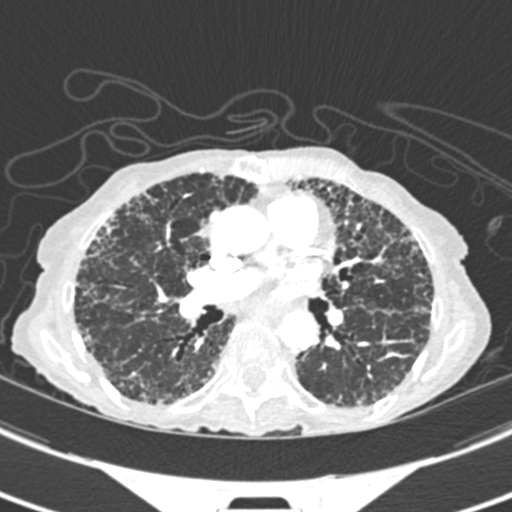
[im 159/262  soft-tissue]
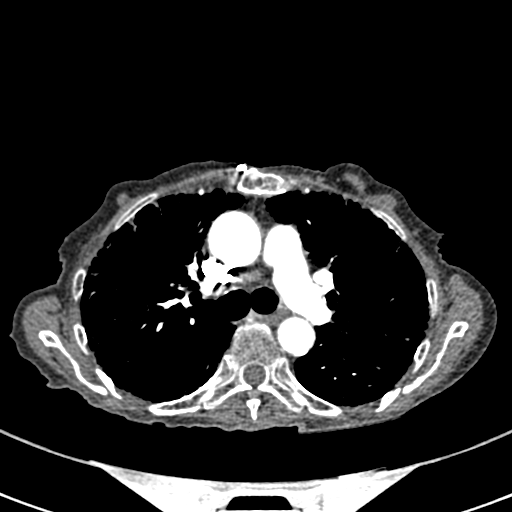
[im 171/262  lung]
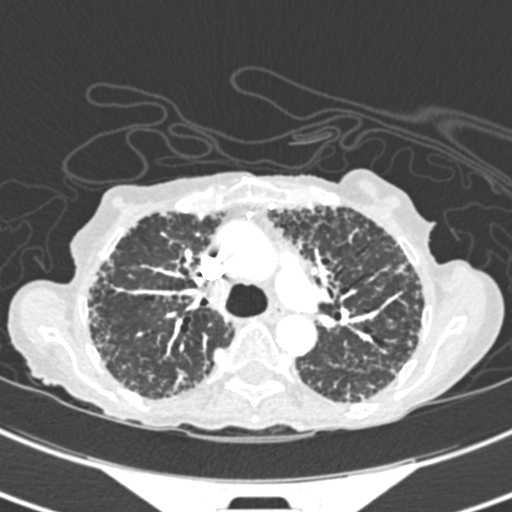
[im 182/262  soft-tissue]
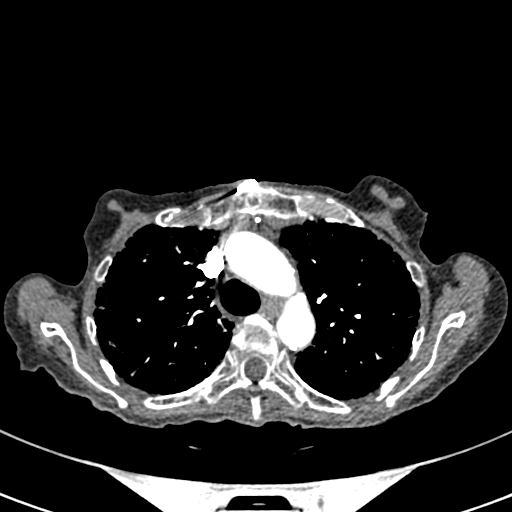
[im 205/262  lung]
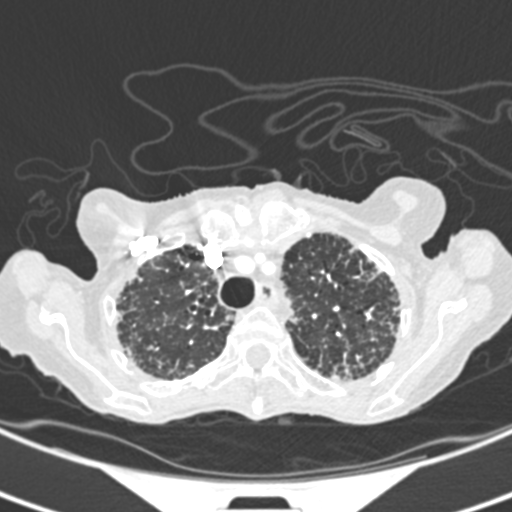
[im 216/262  soft-tissue]
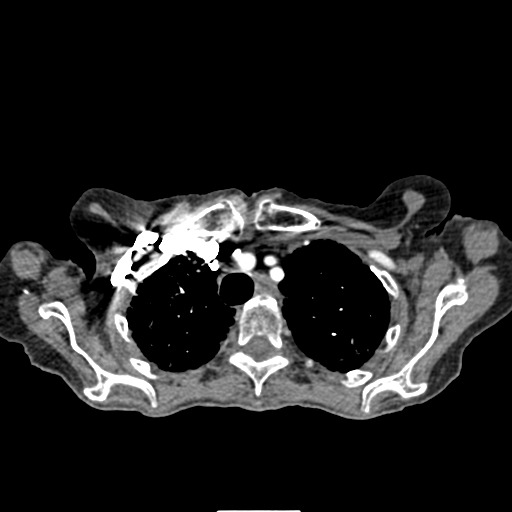
[im 227/262  lung]
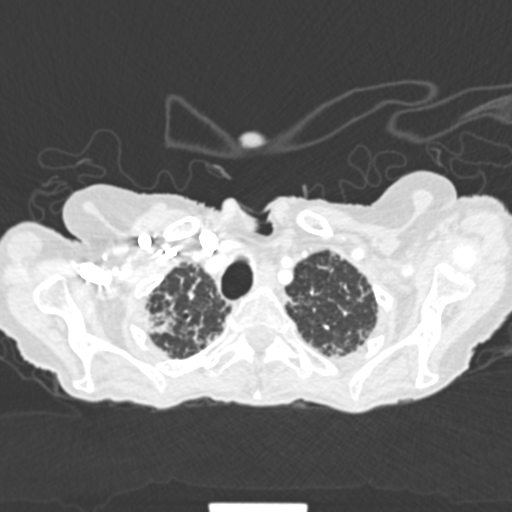
[im 250/262  soft-tissue]
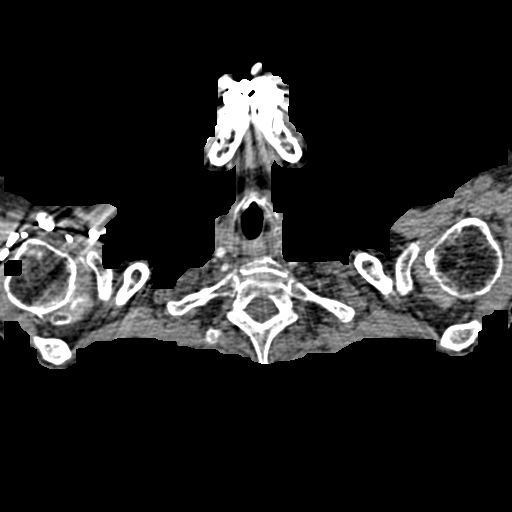

[Series 7: coronal mpr · coronal · 0.52mm/px · 2 of 64 slices shown]
[im 22/64  soft-tissue]
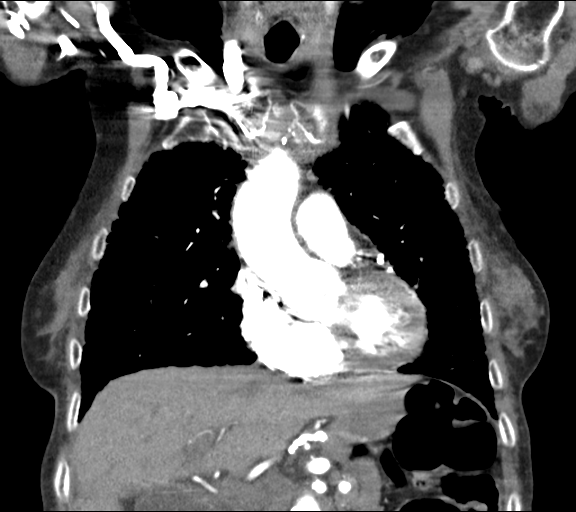
[im 43/64  soft-tissue]
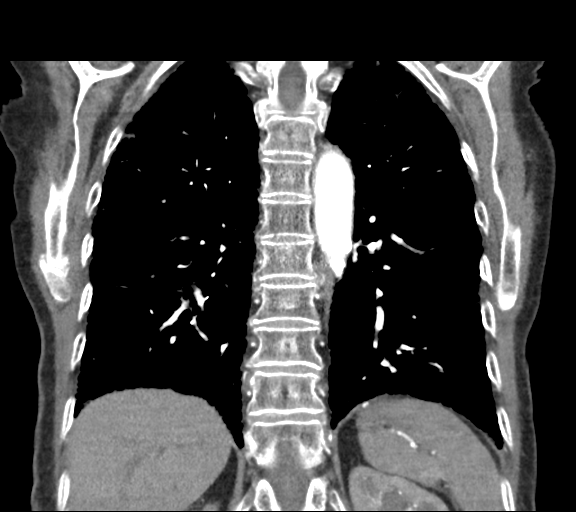

[18 of 46 positions shown; findings below may reference images not displayed]

FINDINGS: Cardiovascular: Pulmonary arterial opacification is adequate without
evidence of emboli. There is thoracic aortic atherosclerosis without
evidence of aneurysm or dissection. Prior CABG is noted with LAD and
right coronary artery atherosclerosis. The heart is normal in size.
No pericardial effusion.

Mediastinum/Nodes: No enlarged axillary, mediastinal, or hilar lymph
nodes are identified. The thyroid and esophagus are grossly
unremarkable. Minimal secretions are noted in the trachea.

Lungs/Pleura: No pleural effusion or pneumothorax. Extensive
subpleural reticulation is again seen throughout both lungs with
traction bronchiectasis and bibasilar honeycombing, not
significantly changed. No acute airspace consolidation or mass is
identified.

Upper Abdomen: Prior cholecystectomy. Prior gastric bypass.
Unchanged low density left adrenal gland thickening.

Musculoskeletal: No chest wall mass. There is a transverse
nondisplaced fracture through the inferior aspect of the right
scapula with callus formation, new from the prior CT.

Review of the MIP images confirms the above findings.
IMPRESSION: 1. No evidence of pulmonary emboli.
2. Unchanged advanced fibrotic interstitial lung disease compatible
with usual interstitial pneumonia.
3. Healing nondisplaced inferior right scapular fracture.
4. Aortic atherosclerosis.

## 2018-03-20 ENCOUNTER — Other Ambulatory Visit: Payer: Self-pay | Admitting: Family Medicine

## 2018-03-20 NOTE — Telephone Encounter (Signed)
Refill request received I just called to check on her She's "fair", having some issues with constipation, getting bound up On pain regimen, not drinking enough fluids, vicious cycle Hospice is managing her pain They have personal minister, doing okay with that Please let her know I'm thinking of her

## 2018-03-28 ENCOUNTER — Telehealth: Payer: Self-pay | Admitting: Family Medicine

## 2018-03-28 NOTE — Telephone Encounter (Signed)
Left detailed voicemail

## 2018-03-28 NOTE — Telephone Encounter (Signed)
Copied from [place] [phone]. Topic: Quick Communication - See Telephone Encounter >> Mar 28, 2018 12:27 PM Rutherford Nail, NT wrote: CRM for notification. See Telephone encounter for: 03/28/18. Stephanie with [facility] calling and states that the patient is unable to take the Relistor for her bowels. States that she would like a call back to discuss. Please advise. CB#: [phone]

## 2018-03-28 NOTE — Telephone Encounter (Signed)
I'll suggest that they talk with hospice director who is prescribing pain medicines to see if that dose can be decreased somewhat; it sounds like her constipation is opioid-related We appreciate what they do for Ms. Patient; thank you

## 2018-03-28 NOTE — Telephone Encounter (Signed)
Please tell me why she is "unable" to take it (Prior allergy? Reaction? Etc.) What do they recommend?

## 2018-03-28 NOTE — Telephone Encounter (Signed)
I called nurse back she states that this med is to exspensive in runs $800 for about 2 weeks.  She states she is having a hard time getting patient to take medications due to declining mental state she states she will hold in mouth for some time.  She gave her a doculax suppository today and states she may have to do daily for her constipation.

## 2018-04-09 ENCOUNTER — Telehealth: Payer: Self-pay | Admitting: Family Medicine

## 2018-04-27 NOTE — Telephone Encounter (Signed)
I called to speak with family member after learning of Anita Atkins's passing; I spoke with son-in-law, Anita Atkins; offered condolences; he thanked me for the call

## 2018-04-27 DEATH — deceased
